# Patient Record
Sex: Female | Born: 1985 | Race: Black or African American | Hispanic: No | Marital: Single | Smoking: Former smoker
Health system: Southern US, Community
[De-identification: ages and names within clinical notes are randomized; demographics above are authoritative.]

## PROBLEM LIST (undated history)

## (undated) ENCOUNTER — Emergency Department (HOSPITAL_COMMUNITY): Discharge status: Absent without leave | Payer: Medicaid Other

## (undated) DIAGNOSIS — E559 Vitamin D deficiency, unspecified: Secondary | ICD-10-CM

## (undated) DIAGNOSIS — E119 Type 2 diabetes mellitus without complications: Secondary | ICD-10-CM

## (undated) DIAGNOSIS — I10 Essential (primary) hypertension: Secondary | ICD-10-CM

## (undated) DIAGNOSIS — F32A Depression, unspecified: Secondary | ICD-10-CM

## (undated) DIAGNOSIS — B379 Candidiasis, unspecified: Secondary | ICD-10-CM

## (undated) DIAGNOSIS — F4310 Post-traumatic stress disorder, unspecified: Secondary | ICD-10-CM

## (undated) DIAGNOSIS — N939 Abnormal uterine and vaginal bleeding, unspecified: Secondary | ICD-10-CM

## (undated) DIAGNOSIS — F329 Major depressive disorder, single episode, unspecified: Secondary | ICD-10-CM

## (undated) DIAGNOSIS — G43909 Migraine, unspecified, not intractable, without status migrainosus: Secondary | ICD-10-CM

## (undated) DIAGNOSIS — I499 Cardiac arrhythmia, unspecified: Secondary | ICD-10-CM

## (undated) DIAGNOSIS — R011 Cardiac murmur, unspecified: Secondary | ICD-10-CM

## (undated) DIAGNOSIS — B009 Herpesviral infection, unspecified: Secondary | ICD-10-CM

## (undated) DIAGNOSIS — J45909 Unspecified asthma, uncomplicated: Secondary | ICD-10-CM

## (undated) DIAGNOSIS — Z87442 Personal history of urinary calculi: Secondary | ICD-10-CM

## (undated) DIAGNOSIS — Z975 Presence of (intrauterine) contraceptive device: Secondary | ICD-10-CM

## (undated) DIAGNOSIS — R7309 Other abnormal glucose: Secondary | ICD-10-CM

## (undated) DIAGNOSIS — M1990 Unspecified osteoarthritis, unspecified site: Secondary | ICD-10-CM

## (undated) DIAGNOSIS — N898 Other specified noninflammatory disorders of vagina: Secondary | ICD-10-CM

## (undated) DIAGNOSIS — E282 Polycystic ovarian syndrome: Secondary | ICD-10-CM

## (undated) DIAGNOSIS — K8020 Calculus of gallbladder without cholecystitis without obstruction: Secondary | ICD-10-CM

## (undated) DIAGNOSIS — G629 Polyneuropathy, unspecified: Secondary | ICD-10-CM

## (undated) DIAGNOSIS — E669 Obesity, unspecified: Secondary | ICD-10-CM

## (undated) DIAGNOSIS — G4730 Sleep apnea, unspecified: Secondary | ICD-10-CM

## (undated) DIAGNOSIS — J40 Bronchitis, not specified as acute or chronic: Secondary | ICD-10-CM

## (undated) DIAGNOSIS — Z202 Contact with and (suspected) exposure to infections with a predominantly sexual mode of transmission: Secondary | ICD-10-CM

## (undated) DIAGNOSIS — F419 Anxiety disorder, unspecified: Secondary | ICD-10-CM

## (undated) DIAGNOSIS — R51 Headache: Secondary | ICD-10-CM

## (undated) HISTORY — DX: Type 2 diabetes mellitus without complications: E11.9

## (undated) HISTORY — DX: Other specified noninflammatory disorders of vagina: N89.8

## (undated) HISTORY — PX: CHOLECYSTECTOMY: SHX55

## (undated) HISTORY — DX: Cardiac arrhythmia, unspecified: I49.9

## (undated) HISTORY — DX: Candidiasis, unspecified: B37.9

## (undated) HISTORY — DX: Presence of (intrauterine) contraceptive device: Z97.5

## (undated) HISTORY — DX: Depression, unspecified: F32.A

## (undated) HISTORY — DX: Major depressive disorder, single episode, unspecified: F32.9

## (undated) HISTORY — DX: Anxiety disorder, unspecified: F41.9

## (undated) HISTORY — DX: Obesity, unspecified: E66.9

## (undated) HISTORY — PX: TONSILLECTOMY: SUR1361

## (undated) HISTORY — DX: Migraine, unspecified, not intractable, without status migrainosus: G43.909

## (undated) HISTORY — DX: Unspecified asthma, uncomplicated: J45.909

## (undated) HISTORY — DX: Herpesviral infection, unspecified: B00.9

## (undated) HISTORY — DX: Headache: R51

## (undated) HISTORY — DX: Polyneuropathy, unspecified: G62.9

## (undated) HISTORY — DX: Cardiac murmur, unspecified: R01.1

## (undated) HISTORY — DX: Contact with and (suspected) exposure to infections with a predominantly sexual mode of transmission: Z20.2

## (undated) HISTORY — DX: Unspecified osteoarthritis, unspecified site: M19.90

## (undated) HISTORY — DX: Post-traumatic stress disorder, unspecified: F43.10

## (undated) HISTORY — DX: Other abnormal glucose: R73.09

## (undated) HISTORY — DX: Vitamin D deficiency, unspecified: E55.9

---
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
## 2005-03-11 ENCOUNTER — Emergency Department (HOSPITAL_COMMUNITY): Admission: EM | Admit: 2005-03-11 | Discharge: 2005-03-11 | Payer: Self-pay | Admitting: Emergency Medicine

## 2007-04-30 ENCOUNTER — Emergency Department (HOSPITAL_COMMUNITY): Admission: EM | Admit: 2007-04-30 | Discharge: 2007-04-30 | Payer: Self-pay | Admitting: Emergency Medicine

## 2007-07-21 ENCOUNTER — Emergency Department (HOSPITAL_COMMUNITY): Admission: EM | Admit: 2007-07-21 | Discharge: 2007-07-21 | Payer: Self-pay | Admitting: Emergency Medicine

## 2007-09-04 ENCOUNTER — Emergency Department (HOSPITAL_COMMUNITY): Admission: EM | Admit: 2007-09-04 | Discharge: 2007-09-04 | Payer: Self-pay | Admitting: Emergency Medicine

## 2008-10-29 ENCOUNTER — Emergency Department (HOSPITAL_COMMUNITY): Admission: EM | Admit: 2008-10-29 | Discharge: 2008-10-29 | Payer: Self-pay | Admitting: Emergency Medicine

## 2009-07-24 ENCOUNTER — Emergency Department (HOSPITAL_COMMUNITY): Admission: EM | Admit: 2009-07-24 | Discharge: 2009-07-24 | Payer: Self-pay | Admitting: Emergency Medicine

## 2010-03-12 ENCOUNTER — Emergency Department (HOSPITAL_COMMUNITY): Admission: EM | Admit: 2010-03-12 | Discharge: 2010-03-12 | Payer: Self-pay | Admitting: Emergency Medicine

## 2010-03-23 ENCOUNTER — Other Ambulatory Visit: Admission: RE | Admit: 2010-03-23 | Discharge: 2010-03-23 | Payer: Self-pay | Admitting: Unknown Physician Specialty

## 2010-07-24 ENCOUNTER — Emergency Department (HOSPITAL_COMMUNITY): Admission: EM | Admit: 2010-07-24 | Discharge: 2010-07-24 | Payer: Self-pay | Admitting: Emergency Medicine

## 2010-08-02 ENCOUNTER — Emergency Department (HOSPITAL_COMMUNITY): Admission: EM | Admit: 2010-08-02 | Discharge: 2010-08-03 | Payer: Self-pay | Admitting: Emergency Medicine

## 2010-10-15 ENCOUNTER — Emergency Department (HOSPITAL_COMMUNITY)
Admission: EM | Admit: 2010-10-15 | Discharge: 2010-10-15 | Payer: Self-pay | Source: Home / Self Care | Admitting: Emergency Medicine

## 2011-01-18 LAB — RAPID STREP SCREEN (MED CTR MEBANE ONLY): Streptococcus, Group A Screen (Direct): NEGATIVE

## 2011-01-20 LAB — POCT I-STAT, CHEM 8
Calcium, Ion: 0.96 mmol/L — ABNORMAL LOW (ref 1.12–1.32)
Chloride: 109 mEq/L (ref 96–112)
Potassium: 3.4 mEq/L — ABNORMAL LOW (ref 3.5–5.1)
Sodium: 139 mEq/L (ref 135–145)
TCO2: 21 mmol/L (ref 0–100)

## 2011-01-20 LAB — BASIC METABOLIC PANEL
BUN: 6 mg/dL (ref 6–23)
CO2: 24 mEq/L (ref 19–32)
Chloride: 105 mEq/L (ref 96–112)
GFR calc Af Amer: >60 mL/min (ref >60)
GFR calc non Af Amer: >60 mL/min (ref >60)
Potassium: 3.5 mEq/L (ref 3.5–5.1)
Sodium: 136 mEq/L (ref 135–145)

## 2011-01-20 LAB — URINALYSIS, ROUTINE W REFLEX MICROSCOPIC
Glucose, UA: NEGATIVE mg/dL
Ketones, ur: NEGATIVE mg/dL
Nitrite: NEGATIVE
pH: 6.5 (ref 5.0–8.0)

## 2011-01-20 LAB — GC/CHLAMYDIA PROBE AMP, GENITAL: GC Probe Amp, Genital: NEGATIVE

## 2011-02-11 LAB — PREGNANCY, URINE: Preg Test, Ur: NEGATIVE

## 2011-06-15 ENCOUNTER — Emergency Department (HOSPITAL_COMMUNITY)
Admission: EM | Admit: 2011-06-15 | Discharge: 2011-06-15 | Disposition: A | Discharge status: Home / Self Care | Payer: Self-pay | Attending: Emergency Medicine | Admitting: Emergency Medicine

## 2011-06-15 ENCOUNTER — Encounter: Payer: Self-pay | Admitting: *Deleted

## 2011-06-15 DIAGNOSIS — B9689 Other specified bacterial agents as the cause of diseases classified elsewhere: Secondary | ICD-10-CM | POA: Insufficient documentation

## 2011-06-15 DIAGNOSIS — R197 Diarrhea, unspecified: Secondary | ICD-10-CM | POA: Insufficient documentation

## 2011-06-15 DIAGNOSIS — A499 Bacterial infection, unspecified: Secondary | ICD-10-CM | POA: Insufficient documentation

## 2011-06-15 DIAGNOSIS — R109 Unspecified abdominal pain: Secondary | ICD-10-CM | POA: Insufficient documentation

## 2011-06-15 DIAGNOSIS — I10 Essential (primary) hypertension: Secondary | ICD-10-CM | POA: Insufficient documentation

## 2011-06-15 DIAGNOSIS — E282 Polycystic ovarian syndrome: Secondary | ICD-10-CM | POA: Insufficient documentation

## 2011-06-15 DIAGNOSIS — N760 Acute vaginitis: Secondary | ICD-10-CM | POA: Insufficient documentation

## 2011-06-15 DIAGNOSIS — R110 Nausea: Secondary | ICD-10-CM | POA: Insufficient documentation

## 2011-06-15 HISTORY — DX: Polycystic ovarian syndrome: E28.2

## 2011-06-15 HISTORY — DX: Abnormal uterine and vaginal bleeding, unspecified: N93.9

## 2011-06-15 HISTORY — DX: Sleep apnea, unspecified: G47.30

## 2011-06-15 HISTORY — DX: Essential (primary) hypertension: I10

## 2011-06-15 HISTORY — DX: Bronchitis, not specified as acute or chronic: J40

## 2011-06-15 LAB — URINE MICROSCOPIC-ADD ON

## 2011-06-15 LAB — URINALYSIS, ROUTINE W REFLEX MICROSCOPIC
Glucose, UA: NEGATIVE mg/dL
Ketones, ur: NEGATIVE mg/dL
Leukocytes, UA: NEGATIVE
Specific Gravity, Urine: 1.015 (ref 1.005–1.030)
pH: 6.5 (ref 5.0–8.0)

## 2011-06-15 LAB — PREGNANCY, URINE: Preg Test, Ur: NEGATIVE

## 2011-06-15 MED ORDER — ONDANSETRON 8 MG PO TBDP
8.0000 mg | ORAL_TABLET | Freq: Once | ORAL | Status: AC
  Administered 2011-06-15: 8 mg via ORAL
  Filled 2011-06-15: qty 1

## 2011-06-15 MED ORDER — OXYCODONE-ACETAMINOPHEN 5-325 MG PO TABS
2.0000 | ORAL_TABLET | Freq: Once | ORAL | Status: AC
  Administered 2011-06-15: 2 via ORAL
  Filled 2011-06-15: qty 2

## 2011-06-15 MED ORDER — ONDANSETRON 8 MG PO TBDP: 8.0000 mg | ORAL_TABLET | Freq: Three times a day (TID) | ORAL | Status: AC | PRN

## 2011-06-15 MED ORDER — METRONIDAZOLE 500 MG PO TABS: 500.0000 mg | ORAL_TABLET | Freq: Two times a day (BID) | ORAL | Status: AC

## 2011-06-15 NOTE — ED Provider Notes (Signed)
History   Chart scribed for Joya Gaskins, MD by Enos Fling; the patient was seen in room [facility]09/[facility]09; this patient's care was started at 12:08 PM.    CSN: [id number] Arrival date & time: 06/15/2011 11:44 AM  Chief Complaint  Patient presents with  . Abdominal Pain   HPI [name] is a 25 y.o. female who presents to the Emergency Department complaining of diarrhea. Pt reports persistent,worsening diarrhea x 3 days, 3 episodes of loose stool this AM, no blood in stool, with associated mild low sharp abd pain and nausea, no vomiting. Mild dysuria and vaginal discharge but no hematuria or vaginal bleeding. Pt tolerated food and fluids po. Denies fever, chills, or ha. No sick contacts or recent abx or hospitalization. No major abd surgeries. Pt h/o polycystic ovarian syndrome but states this pain different than usual pain.  PAST MEDICAL HISTORY:  Past Medical History  Diagnosis Date  . Hypertension   . Polycystic ovarian syndrome   . Bronchitis   . Abnormal vaginal bleeding   . Sleep apnea      PAST SURGICAL HISTORY:  Past Surgical History  Procedure Date  . Tonsillectomy      MEDICATIONS:  Previous Medications   IBUPROFEN (ADVIL,MOTRIN) 200 MG TABLET    Take 400 mg by mouth every 8 (eight) hours as needed. Headaches/pain    LISINOPRIL-HYDROCHLOROTHIAZIDE (PRINZIDE,ZESTORETIC) 20-25 MG PER TABLET    Take 1 tablet by mouth daily.       ALLERGIES:  Allergies as of 06/15/2011  . (No Known Allergies)     FAMILY HISTORY:  No family history on file.   SOCIAL HISTORY: History   Social History  . Marital Status: Single    Spouse Name: N/A    Number of Children: N/A  . Years of Education: N/A   Social History Main Topics  . Smoking status: Never Smoker   . Smokeless tobacco: None  . Alcohol Use: Yes     occasional  . Drug Use: No  . Sexually Active: Yes    Birth Control/ Protection: None   Other Topics Concern  . None   Social History Narrative  .  None     Review of Systems 10 Systems reviewed and are negative for acute change except as noted in the HPI.  Physical Exam  BP 137/106  Pulse 84  Temp(Src) 98.3 F (36.8 C) (Oral)  Resp 18  Ht 5\' 7"  (1.702 m)  Wt 261 lb (118.389 kg)  BMI 40.88 kg/m2  SpO2 98%  LMP 06/05/2011  Physical Exam CONSTITUTIONAL: Well developed/well nourished HEAD AND FACE: Normocephalic/atraumatic EYES: EOMI/PERRL ENMT: Mucous membranes moist NECK: supple no meningeal signs SPINE:entire spine nontender CV: S1/S2 noted, no murmurs/rubs/gallops noted LUNGS: Lungs are clear to auscultation bilaterally, no apparent distress ABDOMEN: soft, no rebound or guarding, mild diffuse lower abd tenderness GU:no cva tenderness, pelvic exam with nurse present, small amt of vag discharge, no vag bleeding, no cmt, no adnexal tenderness NEURO: Pt is awake/alert, moves all extremitiesx4 EXTREMITIES: pulses normal, full ROM SKIN: warm, color normal PSYCH: no abnormalities of mood noted  ED Course  Procedures  OTHER DATA REVIEWED: Nursing notes, vital signs, and past medical records reviewed.   LABS / RADIOLOGY:  All labs/vitals reviewed and considered   MDM: Pt well appearing, taking PO, abd soft     IMPRESSION Acute diarrhea  Bacterial vaginosis      PLAN:  Discharge home The patient is to return the emergency department if there is  any worsening of symptoms. I have reviewed the discharge instructions with the patient   CONDITION ON DISCHARGE: Stable   MEDICATIONS GIVEN IN THE E.D. ondansetron (ZOFRAN-ODT) disintegrating tablet 8 mg (8 mg Oral Given 06/15/11 1215)  oxyCODONE-acetaminophen (PERCOCET) 5-325 MG per tablet 2 tablet (2 tablet Oral Given 06/15/11 1306)     DISCHARGE MEDICATIONS: none  I personally performed the services described in this documentation, which was scribed in my presence. The recorded information has been reviewed and considered. Joya Gaskins,  MD         Joya Gaskins, MD 06/15/11 [phone]

## 2011-06-15 NOTE — ED Notes (Signed)
C/o lower abd pain with nausea and diarrhea x 4 days.  Denies vomiting.  Also experiencing decreased appetite and urinary retention.

## 2011-06-16 LAB — GC/CHLAMYDIA PROBE AMP, GENITAL: GC Probe Amp, Genital: NEGATIVE

## 2011-08-12 LAB — URINALYSIS, ROUTINE W REFLEX MICROSCOPIC
Ketones, ur: NEGATIVE mg/dL
Leukocytes, UA: NEGATIVE
Nitrite: NEGATIVE
Specific Gravity, Urine: 1.025 (ref 1.005–1.030)
pH: 6 (ref 5.0–8.0)

## 2011-08-12 LAB — URINE MICROSCOPIC-ADD ON

## 2011-08-12 LAB — PREGNANCY, URINE: Preg Test, Ur: NEGATIVE

## 2011-08-17 LAB — STREP A DNA PROBE

## 2011-08-17 LAB — RAPID STREP SCREEN (MED CTR MEBANE ONLY): Streptococcus, Group A Screen (Direct): NEGATIVE

## 2011-08-24 LAB — DIFFERENTIAL
Eosinophils Absolute: 0.1
Eosinophils Relative: 1
Lymphs Abs: 3.4 — ABNORMAL HIGH
Monocytes Relative: 7

## 2011-08-24 LAB — BASIC METABOLIC PANEL
BUN: 6
CO2: 27
Chloride: 106
Potassium: 3.3 — ABNORMAL LOW

## 2011-08-24 LAB — URINALYSIS, ROUTINE W REFLEX MICROSCOPIC
Bilirubin Urine: NEGATIVE
Glucose, UA: NEGATIVE
Ketones, ur: NEGATIVE
pH: 7

## 2011-08-24 LAB — URINE MICROSCOPIC-ADD ON

## 2011-08-24 LAB — CBC
HCT: 36.1
MCV: 84.3
Platelets: 326
RBC: 4.28
WBC: 9.7

## 2011-09-23 ENCOUNTER — Emergency Department (HOSPITAL_COMMUNITY)
Admission: EM | Admit: 2011-09-23 | Discharge: 2011-09-23 | Disposition: A | Discharge status: Home / Self Care | Payer: Self-pay | Attending: Emergency Medicine | Admitting: Emergency Medicine

## 2011-09-23 ENCOUNTER — Encounter (HOSPITAL_COMMUNITY): Payer: Self-pay | Admitting: Emergency Medicine

## 2011-09-23 ENCOUNTER — Emergency Department (HOSPITAL_COMMUNITY): Payer: Self-pay

## 2011-09-23 DIAGNOSIS — R319 Hematuria, unspecified: Secondary | ICD-10-CM | POA: Insufficient documentation

## 2011-09-23 DIAGNOSIS — I10 Essential (primary) hypertension: Secondary | ICD-10-CM | POA: Insufficient documentation

## 2011-09-23 DIAGNOSIS — N201 Calculus of ureter: Secondary | ICD-10-CM | POA: Insufficient documentation

## 2011-09-23 DIAGNOSIS — N200 Calculus of kidney: Secondary | ICD-10-CM | POA: Insufficient documentation

## 2011-09-23 DIAGNOSIS — D72829 Elevated white blood cell count, unspecified: Secondary | ICD-10-CM | POA: Insufficient documentation

## 2011-09-23 LAB — URINE MICROSCOPIC-ADD ON

## 2011-09-23 LAB — CBC
MCH: 30 pg (ref 26.0–34.0)
Platelets: 282 10*3/uL (ref 150–400)
RBC: 4.16 MIL/uL (ref 3.87–5.11)
WBC: 14.8 10*3/uL — ABNORMAL HIGH (ref 4.0–10.5)

## 2011-09-23 LAB — COMPREHENSIVE METABOLIC PANEL
ALT: 11 U/L (ref 0–35)
AST: 17 U/L (ref 0–37)
Albumin: 4.1 g/dL (ref 3.5–5.2)
Alkaline Phosphatase: 100 U/L (ref 39–117)
GFR calc Af Amer: >90 mL/min (ref >90)
Glucose, Bld: 166 mg/dL — ABNORMAL HIGH (ref 70–99)
Potassium: 3.7 mEq/L (ref 3.5–5.1)
Sodium: 138 mEq/L (ref 135–145)
Total Protein: 7.8 g/dL (ref 6.0–8.3)

## 2011-09-23 LAB — URINALYSIS, ROUTINE W REFLEX MICROSCOPIC
Leukocytes, UA: NEGATIVE
Nitrite: NEGATIVE
Specific Gravity, Urine: 1.025 (ref 1.005–1.030)
pH: 6.5 (ref 5.0–8.0)

## 2011-09-23 LAB — DIFFERENTIAL
Eosinophils Absolute: 0.1 10*3/uL (ref 0.0–0.7)
Lymphs Abs: 4.6 10*3/uL — ABNORMAL HIGH (ref 0.7–4.0)
Neutrophils Relative %: 61 % (ref 43–77)

## 2011-09-23 MED ORDER — IBUPROFEN 800 MG PO TABS: 800.0000 mg | ORAL_TABLET | Freq: Three times a day (TID) | ORAL | Status: AC

## 2011-09-23 MED ORDER — HYDROCODONE-ACETAMINOPHEN 5-325 MG PO TABS
2.0000 | ORAL_TABLET | Freq: Once | ORAL | Status: AC
  Administered 2011-09-23: 2 via ORAL
  Filled 2011-09-23: qty 2

## 2011-09-23 MED ORDER — ONDANSETRON HCL 4 MG/2ML IJ SOLN
4.0000 mg | Freq: Once | INTRAMUSCULAR | Status: AC
  Administered 2011-09-23: 4 mg via INTRAVENOUS
  Filled 2011-09-23: qty 2

## 2011-09-23 MED ORDER — PROMETHAZINE HCL 25 MG PO TABS: 25.0000 mg | ORAL_TABLET | Freq: Four times a day (QID) | ORAL | Status: DC | PRN

## 2011-09-23 MED ORDER — TAMSULOSIN HCL 0.4 MG PO CAPS: 0.4000 mg | ORAL_CAPSULE | Freq: Two times a day (BID) | ORAL | Status: DC

## 2011-09-23 MED ORDER — HYDROCODONE-ACETAMINOPHEN 5-500 MG PO TABS: 1.0000 | ORAL_TABLET | Freq: Four times a day (QID) | ORAL | Status: AC | PRN

## 2011-09-23 MED ORDER — MORPHINE SULFATE 4 MG/ML IJ SOLN
4.0000 mg | Freq: Once | INTRAMUSCULAR | Status: AC
  Administered 2011-09-23: 4 mg via INTRAVENOUS
  Filled 2011-09-23: qty 1

## 2011-09-23 MED ORDER — KETOROLAC TROMETHAMINE 30 MG/ML IJ SOLN
30.0000 mg | Freq: Once | INTRAMUSCULAR | Status: AC
  Administered 2011-09-23: 30 mg via INTRAVENOUS
  Filled 2011-09-23: qty 1

## 2011-09-23 NOTE — ED Provider Notes (Signed)
History     CSN: [id number] Arrival date & time: 09/23/2011  1:53 AM   First MD Initiated Contact with Patient 09/23/11 0207      Chief Complaint  Patient presents with  . Abdominal Pain  . Emesis    (Consider location/radiation/quality/duration/timing/severity/associated sxs/prior treatment) HPI Comments: 25 year old female with no history of abdominal surgery presents with right upper quadrant and right side pain. This was acute in onset approximately 1.5 hours prior to arrival, constant, sharp, nonradiating. She has had several episodes of vomiting since being in the emergency department. He states this was acute in spontaneous in onset not related to trauma or other body wall injury she denies dysuria, diarrhea, hematuria, fevers or chills  Patient is a 25 y.o. female presenting with abdominal pain and vomiting.  Abdominal Pain The primary symptoms of the illness include abdominal pain, nausea and vomiting. The primary symptoms of the illness do not include fever, fatigue, shortness of breath, diarrhea, hematochezia, dysuria, vaginal discharge or vaginal bleeding. Episode onset: 1.5 hours pta. The onset of the illness was sudden. The problem has not changed since onset. The pain came on suddenly. The abdominal pain has been unchanged since its onset. The abdominal pain is located in the RUQ. The severity of the abdominal pain is 10/10. Exacerbated by: nothing.  Pregnant Now: unknown. The patient has not had a change in bowel habit. Symptoms associated with the illness do not include chills, anorexia, diaphoresis, heartburn, constipation, urgency, hematuria, frequency or back pain. Significant associated medical issues do not include PUD, GERD, inflammatory bowel disease, diabetes, gallstones, liver disease, substance abuse or diverticulitis.  Emesis  Associated symptoms include abdominal pain. Pertinent negatives include no chills, no diarrhea and no fever.    Past Medical History    Diagnosis Date  . Hypertension   . Polycystic ovarian syndrome   . Bronchitis   . Abnormal vaginal bleeding   . Sleep apnea     Past Surgical History  Procedure Date  . Tonsillectomy     No family history on file.  History  Substance Use Topics  . Smoking status: Never Smoker   . Smokeless tobacco: Not on file  . Alcohol Use: Yes     occasional    OB History    Grav Para Term Preterm Abortions TAB SAB Ect Mult Living                  Review of Systems  Constitutional: Negative for fever, chills, diaphoresis and fatigue.  Respiratory: Negative for shortness of breath.   Gastrointestinal: Positive for nausea, vomiting and abdominal pain. Negative for heartburn, diarrhea, constipation, hematochezia and anorexia.  Genitourinary: Negative for dysuria, urgency, frequency, hematuria, vaginal bleeding and vaginal discharge.  Musculoskeletal: Negative for back pain.  All other systems reviewed and are negative.    Allergies  Review of patient's allergies indicates no known allergies.  Home Medications   Current Outpatient Rx  Name Route Sig Dispense Refill  . HYDROCODONE-ACETAMINOPHEN 5-500 MG PO TABS Oral Take 1-2 tablets by mouth every 6 (six) hours as needed for pain. 15 tablet 0  . IBUPROFEN 200 MG PO TABS Oral Take 400 mg by mouth every 8 (eight) hours as needed. Headaches/pain     . IBUPROFEN 800 MG PO TABS Oral Take 1 tablet (800 mg total) by mouth 3 (three) times daily. 21 tablet 0  . LISINOPRIL-HYDROCHLOROTHIAZIDE 20-25 MG PO TABS Oral Take 1 tablet by mouth daily.      Marland Kitchen PROMETHAZINE HCL  25 MG PO TABS Oral Take 1 tablet (25 mg total) by mouth every 6 (six) hours as needed for nausea. 12 tablet 0  . TAMSULOSIN HCL 0.4 MG PO CAPS Oral Take 1 capsule (0.4 mg total) by mouth 2 (two) times daily. 10 capsule 0    BP 112/66  Pulse 68  Temp(Src) 97.9 F (36.6 C) (Oral)  Resp 18  Ht 5\' 6"  (1.676 m)  Wt 260 lb (117.935 kg)  BMI 41.97 kg/m2  SpO2 99%  LMP  09/05/2011  Physical Exam  Nursing note and vitals reviewed. Constitutional: She appears well-developed and well-nourished. She appears distressed ( uncomfortable appearing).  HENT:  Head: Normocephalic and atraumatic.  Mouth/Throat: Oropharynx is clear and moist. No oropharyngeal exudate.  Eyes: Conjunctivae and EOM are normal. Pupils are equal, round, and reactive to light. Right eye exhibits no discharge. Left eye exhibits no discharge. No scleral icterus.  Neck: Normal range of motion. Neck supple. No JVD present. No thyromegaly present.  Cardiovascular: Normal rate, regular rhythm, normal heart sounds and intact distal pulses.  Exam reveals no gallop and no friction rub.   No murmur heard. Pulmonary/Chest: Effort normal and breath sounds normal. No respiratory distress. She has no wheezes. She has no rales.  Abdominal: Soft. Bowel sounds are normal. She exhibits no distension and no mass. There is tenderness ( ttp in the RUQ, R side and inferiorlyl to the R iliac crest).       Obese, no LUQ, LLQ, SP or epigastric ttp  Musculoskeletal: Normal range of motion. She exhibits no edema and no tenderness.  Lymphadenopathy:    She has no cervical adenopathy.  Neurological: She is alert. Coordination normal.  Skin: Skin is warm and dry. No rash noted. No erythema.  Psychiatric: She has a normal mood and affect. Her behavior is normal.    ED Course  Procedures (including critical care time)  Labs Reviewed  URINALYSIS, ROUTINE W REFLEX MICROSCOPIC - Abnormal; Notable for the following:    Color, Urine AMBER (*) BIOCHEMICALS MAY BE AFFECTED BY COLOR   Appearance CLOUDY (*)    Hgb urine dipstick LARGE (*)    Ketones, ur 15 (*)    All other components within normal limits  CBC - Abnormal; Notable for the following:    WBC 14.8 (*)    All other components within normal limits  DIFFERENTIAL - Abnormal; Notable for the following:    Neutro Abs 9.0 (*)    Lymphs Abs 4.6 (*)    All other  components within normal limits  COMPREHENSIVE METABOLIC PANEL - Abnormal; Notable for the following:    Glucose, Bld 166 (*)    Total Bilirubin 0.1 (*)    All other components within normal limits  URINE MICROSCOPIC-ADD ON - Abnormal; Notable for the following:    Squamous Epithelial / LPF MANY (*)    Bacteria, UA FEW (*)    All other components within normal limits  PREGNANCY, URINE  LIPASE, BLOOD   Ct Abdomen Pelvis Wo Contrast  09/23/2011  *RADIOLOGY REPORT*  Clinical Data: Severe right flank pain, nausea and vomiting.  CT ABDOMEN AND PELVIS WITHOUT CONTRAST  Technique:  Multidetector CT imaging of the abdomen and pelvis was performed following the standard protocol without intravenous contrast.  Comparison: None.  Findings: The visualized lung bases are clear.  The liver and spleen are unremarkable in appearance.  The gallbladder is within normal limits.  The pancreas and adrenal glands are unremarkable.  There is question of  minimal right-sided hydronephrosis, though this is not well characterized.  The right ureter is not particularly prominent.  There is a possible obstructing 4 x 3 mm stone at the level of the distal right ureter, just proximal to the right vesicoureteral junction.  The left kidney is unremarkable in appearance.  On comparison with the right, there is more fullness of the right-sided collecting system.  No significant perinephric stranding is noted.  No nonobstructing renal stones are seen.  No free fluid is identified.  The small bowel is unremarkable in appearance.  The stomach is within normal limits.  No acute vascular abnormalities are seen.  The appendix remains normal in caliber, without evidence for appendicitis.  The colon is unremarkable in appearance.  The bladder is mildly distended and appears grossly unremarkable. The uterus is within normal limits.  The ovaries are relatively symmetric; no suspicious adnexal masses are seen.  No inguinal lymphadenopathy is seen.   No acute osseous abnormalities are identified.  IMPRESSION: Question of minimal right-sided hydronephrosis, with a possible obstructing 4 x 3 mm stone at the level of the distal right ureter, just proximal to the right vesicoureteral junction.  Original Report Authenticated By: Tonia Ghent, M.D.     1. Ureterolithiasis   2. Kidney stone on right side       MDM  Patient appears in discomfort, and also appears to have a colicky type sharp right-sided abdominal pain. Would consider kidney stone the patient does have significant tenderness to palpation. Vital signs appear normal, will check comprehensive metabolic panel, lipase, urinalysis. Pain medications ordered  CT scan reviewed with patient, urinalysis showing hematuria, labs showing leukocytosis, CT scan showing distal right ureteral stone. Pain has been well controlled since initial dose of medications, vital signs normal, patient agreeable with discharge and followup.        Vida Roller, MD 09/23/11 [phone]

## 2011-09-23 NOTE — ED Notes (Signed)
Patient taken to room 19 via wheelchair.  Upon arrival to room, patient jumped up from wheelchair and ran to bathroom.

## 2011-09-23 NOTE — ED Notes (Signed)
Patient c/o left side pain that started about 30 min PTA.  Patient states has had vomiting x 3 in past 30 minutes.

## 2011-10-24 ENCOUNTER — Emergency Department (HOSPITAL_COMMUNITY): Payer: Self-pay

## 2011-10-24 ENCOUNTER — Encounter (HOSPITAL_COMMUNITY): Payer: Self-pay

## 2011-10-24 ENCOUNTER — Inpatient Hospital Stay (HOSPITAL_COMMUNITY)
Admission: EM | Admit: 2011-10-24 | Discharge: 2011-10-30 | DRG: 440 | Disposition: A | Discharge status: Home / Self Care | Payer: Self-pay | Attending: Family Medicine | Admitting: Family Medicine

## 2011-10-24 DIAGNOSIS — E669 Obesity, unspecified: Secondary | ICD-10-CM | POA: Diagnosis present

## 2011-10-24 DIAGNOSIS — K8020 Calculus of gallbladder without cholecystitis without obstruction: Secondary | ICD-10-CM | POA: Diagnosis present

## 2011-10-24 DIAGNOSIS — I10 Essential (primary) hypertension: Secondary | ICD-10-CM | POA: Diagnosis present

## 2011-10-24 DIAGNOSIS — K859 Acute pancreatitis without necrosis or infection, unspecified: Principal | ICD-10-CM | POA: Diagnosis present

## 2011-10-24 DIAGNOSIS — G4730 Sleep apnea, unspecified: Secondary | ICD-10-CM | POA: Diagnosis present

## 2011-10-24 LAB — CBC
MCH: 29.8 pg (ref 26.0–34.0)
Platelets: 217 10*3/uL (ref 150–400)
RBC: 3.92 MIL/uL (ref 3.87–5.11)
WBC: 8.5 10*3/uL (ref 4.0–10.5)

## 2011-10-24 LAB — COMPREHENSIVE METABOLIC PANEL
ALT: 10 U/L (ref 0–35)
AST: 16 U/L (ref 0–37)
Alkaline Phosphatase: 90 U/L (ref 39–117)
GFR calc Af Amer: >90 mL/min (ref >90)
Glucose, Bld: 88 mg/dL (ref 70–99)
Potassium: 3.5 mEq/L (ref 3.5–5.1)
Sodium: 139 mEq/L (ref 135–145)
Total Protein: 6.8 g/dL (ref 6.0–8.3)

## 2011-10-24 LAB — URINALYSIS, ROUTINE W REFLEX MICROSCOPIC
Glucose, UA: NEGATIVE mg/dL
Ketones, ur: NEGATIVE mg/dL
Protein, ur: NEGATIVE mg/dL

## 2011-10-24 LAB — DIFFERENTIAL
Basophils Absolute: 0 10*3/uL (ref 0.0–0.1)
Eosinophils Absolute: 0 10*3/uL (ref 0.0–0.7)
Lymphocytes Relative: 33 % (ref 12–46)
Lymphs Abs: 2.8 10*3/uL (ref 0.7–4.0)
Neutrophils Relative %: 51 % (ref 43–77)

## 2011-10-24 LAB — URINE MICROSCOPIC-ADD ON

## 2011-10-24 MED ORDER — SODIUM CHLORIDE 0.9 % IV SOLN
INTRAVENOUS | Status: DC
  Administered 2011-10-24 – 2011-10-29 (×6): via INTRAVENOUS

## 2011-10-24 MED ORDER — KETOROLAC TROMETHAMINE 30 MG/ML IJ SOLN
30.0000 mg | Freq: Once | INTRAMUSCULAR | Status: AC
  Administered 2011-10-24: 30 mg via INTRAVENOUS
  Filled 2011-10-24: qty 1

## 2011-10-24 MED ORDER — MORPHINE SULFATE 4 MG/ML IJ SOLN
4.0000 mg | Freq: Once | INTRAMUSCULAR | Status: AC
  Administered 2011-10-24: 4 mg via INTRAVENOUS
  Filled 2011-10-24: qty 1

## 2011-10-24 MED ORDER — HYDROMORPHONE HCL PF 1 MG/ML IJ SOLN
1.0000 mg | Freq: Once | INTRAMUSCULAR | Status: AC
  Administered 2011-10-24: 1 mg via INTRAVENOUS
  Filled 2011-10-24: qty 1

## 2011-10-24 MED ORDER — SODIUM CHLORIDE 0.9 % IV BOLUS (SEPSIS)
1000.0000 mL | Freq: Once | INTRAVENOUS | Status: AC
  Administered 2011-10-24: 1000 mL via INTRAVENOUS

## 2011-10-24 MED ORDER — ACETAMINOPHEN 500 MG PO TABS
500.0000 mg | ORAL_TABLET | ORAL | Status: DC | PRN
  Administered 2011-10-28: 500 mg via ORAL
  Filled 2011-10-24: qty 1

## 2011-10-24 MED ORDER — ONDANSETRON HCL 4 MG/2ML IJ SOLN
4.0000 mg | Freq: Four times a day (QID) | INTRAMUSCULAR | Status: DC | PRN
  Administered 2011-10-25 – 2011-10-30 (×17): 4 mg via INTRAVENOUS
  Filled 2011-10-24 (×17): qty 2

## 2011-10-24 MED ORDER — ONDANSETRON HCL 4 MG/2ML IJ SOLN
4.0000 mg | Freq: Once | INTRAMUSCULAR | Status: AC
  Administered 2011-10-24: 4 mg via INTRAVENOUS
  Filled 2011-10-24: qty 2

## 2011-10-24 MED ORDER — LEVOFLOXACIN IN D5W 500 MG/100ML IV SOLN
500.0000 mg | INTRAVENOUS | Status: DC
  Administered 2011-10-24 – 2011-10-29 (×6): 500 mg via INTRAVENOUS
  Filled 2011-10-24 (×7): qty 100

## 2011-10-24 MED ORDER — IOHEXOL 300 MG/ML  SOLN
100.0000 mL | Freq: Once | INTRAMUSCULAR | Status: AC | PRN
  Administered 2011-10-24: 100 mL via INTRAVENOUS

## 2011-10-24 MED ORDER — MORPHINE SULFATE 4 MG/ML IJ SOLN
4.0000 mg | INTRAMUSCULAR | Status: DC | PRN
  Administered 2011-10-25 – 2011-10-30 (×26): 4 mg via INTRAVENOUS
  Filled 2011-10-24 (×27): qty 1

## 2011-10-24 MED ORDER — ONDANSETRON 8 MG/NS 50 ML IVPB
8.0000 mg | INTRAVENOUS | Status: DC | PRN
  Filled 2011-10-24: qty 8

## 2011-10-24 MED ORDER — HYDROMORPHONE HCL PF 1 MG/ML IJ SOLN: 1.0000 mg | Freq: Once | INTRAMUSCULAR | Status: DC

## 2011-10-24 MED ORDER — LEVOFLOXACIN IN D5W 500 MG/100ML IV SOLN
INTRAVENOUS | Status: AC
  Filled 2011-10-24: qty 100

## 2011-10-24 MED ORDER — INFLUENZA VIRUS VACC SPLIT PF IM SUSP
0.5000 mL | INTRAMUSCULAR | Status: AC
  Administered 2011-10-25: 0.5 mL via INTRAMUSCULAR
  Filled 2011-10-24: qty 0.5

## 2011-10-24 NOTE — ED Provider Notes (Signed)
History     CSN: [id number] Arrival date & time: 10/24/2011  4:22 PM   First MD Initiated Contact with Patient 10/24/11 1628      No chief complaint on file.   (Consider location/radiation/quality/duration/timing/severity/associated sxs/prior treatment) HPI Comments: History of kidney stones. Developed nausea and vomiting associated with her right flank pain today. States this is identical to prior renal colic.  Patient is a 25 y.o. female presenting with flank pain. The history is provided by the patient. No language interpreter was used.  Flank Pain This is a recurrent problem. The current episode started 2 days ago. The problem occurs constantly. The problem has been gradually worsening. Associated symptoms include abdominal pain. Pertinent negatives include no chest pain, no headaches and no shortness of breath. The symptoms are aggravated by nothing. The symptoms are relieved by nothing. Treatments tried: ibuprofen. The treatment provided mild relief.    Past Medical History  Diagnosis Date  . Hypertension   . Polycystic ovarian syndrome   . Bronchitis   . Abnormal vaginal bleeding   . Sleep apnea   . Renal disorder     kidney stone    Past Surgical History  Procedure Date  . Tonsillectomy     No family history on file.  History  Substance Use Topics  . Smoking status: Never Smoker   . Smokeless tobacco: Not on file  . Alcohol Use: Yes     occasional    OB History    Grav Para Term Preterm Abortions TAB SAB Ect Mult Living                  Review of Systems  Constitutional: Negative for fever, activity change, appetite change and fatigue.  HENT: Negative for congestion, sore throat, rhinorrhea, neck pain and neck stiffness.   Respiratory: Negative for cough and shortness of breath.   Cardiovascular: Negative for chest pain and palpitations.  Gastrointestinal: Positive for nausea, vomiting, abdominal pain and diarrhea. Negative for blood in stool.    Genitourinary: Positive for flank pain. Negative for dysuria, urgency, frequency, hematuria, vaginal bleeding and vaginal discharge.  Musculoskeletal: Positive for back pain.  Neurological: Negative for dizziness, weakness, light-headedness, numbness and headaches.  All other systems reviewed and are negative.    Allergies  Review of patient's allergies indicates no known allergies.  Home Medications   Current Outpatient Rx  Name Route Sig Dispense Refill  . IBUPROFEN 200 MG PO TABS Oral Take 400 mg by mouth every 8 (eight) hours as needed. Headaches/pain     . LISINOPRIL-HYDROCHLOROTHIAZIDE 20-25 MG PO TABS Oral Take 1 tablet by mouth daily.        BP 122/77  Pulse 76  Temp(Src) 98.1 F (36.7 C) (Oral)  Resp 24  Ht 5\' 7"  (1.702 m)  Wt 267 lb (121.11 kg)  BMI 41.82 kg/m2  SpO2 100%  LMP 10/08/2011  Physical Exam  Nursing note and vitals reviewed. Constitutional: She is oriented to person, place, and time. She appears well-developed and well-nourished. She appears distressed (secondary to pain).  HENT:  Head: Normocephalic and atraumatic.  Mouth/Throat: Oropharynx is clear and moist.  Eyes: Conjunctivae and EOM are normal. Pupils are equal, round, and reactive to light.  Neck: Normal range of motion. Neck supple.  Cardiovascular: Normal rate, regular rhythm, normal heart sounds and intact distal pulses.  Exam reveals no gallop and no friction rub.   No murmur heard. Pulmonary/Chest: Effort normal and breath sounds normal. No respiratory distress.  Abdominal: Soft.  Bowel sounds are normal. There is no tenderness. There is no rebound and no guarding.       R CVA tenderness  Musculoskeletal: Normal range of motion. She exhibits no tenderness.  Neurological: She is alert and oriented to person, place, and time. No cranial nerve deficit.  Skin: Skin is warm and dry. No rash noted.    ED Course  Procedures (including critical care time)  Labs Reviewed  URINALYSIS,  ROUTINE W REFLEX MICROSCOPIC - Abnormal; Notable for the following:    APPearance HAZY (*)    Hgb urine dipstick TRACE (*)    All other components within normal limits  URINE MICROSCOPIC-ADD ON - Abnormal; Notable for the following:    Squamous Epithelial / LPF MANY (*)    Bacteria, UA FEW (*)    All other components within normal limits  CBC - Abnormal; Notable for the following:    Hemoglobin 11.7 (*)    HCT 34.4 (*)    All other components within normal limits  DIFFERENTIAL - Abnormal; Notable for the following:    Monocytes Relative 17 (*)    Monocytes Absolute 1.4 (*)    All other components within normal limits  LIPASE, BLOOD - Abnormal; Notable for the following:    Lipase 165 (*)    All other components within normal limits  POCT PREGNANCY, URINE  COMPREHENSIVE METABOLIC PANEL  POCT PREGNANCY, URINE   US Renal  10/24/2011  *RADIOLOGY REPORT*  Clinical Data: Possible right ureteral stone.  Evaluate for hydronephrosis.  RENAL/URINARY TRACT ULTRASOUND COMPLETE  Comparison:  CT 09/23/2011.  Findings:  Right Kidney:  10.8 cm. Normal size and echotexture.  No focal abnormality.  No hydronephrosis.  Left Kidney:  11.4 cm. Normal size and echotexture.  No focal abnormality.  No hydronephrosis.  Bladder:  Decompressed, grossly unremarkable.  Incidentally noted is a 3 cm gallstone within the gallbladder.  IMPRESSION: No acute findings within the kidneys.  No hydronephrosis.  Cholelithiasis.  Original Report Authenticated By: Cyndie Chime, M.D.     1. Pancreatitis   2. Cholelithiasis       MDM  Mild pancreatitis and cholelithiasis seen on renal ultrasound. She has no evidence of hydro-. This was soft she has a history of kidney stones and states that this felt identical. Minor for laboratory studies are relatively unremarkable. Right upper quadrant ultrasound was not performed secondary to low concern. Remainder of her liver function panel was normal indicating no evidence of acute  obstruction. She continues to have pain. I discussed the case with her primary care physician told that the patient for further evaluation and pain management        Dayton Bailiff, MD 10/24/11 1911

## 2011-10-24 NOTE — ED Notes (Signed)
Report called to Nadine Counts, RN on 300.  Patient will be transported to floor after CT scan.

## 2011-10-24 NOTE — H&P (Signed)
[name], [name]            ACCOUNT NO.:  [id number]  MEDICAL RECORD NO.:  [id number]  LOCATION:  A336                          FACILITY:  [facility]  PHYSICIAN:  Mila Homer. Sudie Bailey, M.D.DATE OF BIRTH:  Jul 23, 1986  DATE OF ADMISSION:  10/24/2011 DATE OF DISCHARGE:  [facility]                             HISTORY & PHYSICAL   This 25 year old developed severe pain in the right flank and CVA regions starting a couple days ago.  This seemed to radiate around to the right lower quadrant of the abdomen.  She then developed nausea and vomiting with this this morning and came to the [facility] Emergency Department this afternoon for evaluation.  She does give me a history of having kidney stones x2.  She also has had suprapubic tenderness with this.  She denies fever and chills.  She has had no shortness of breath, chest pain, or thoracic symptoms with this. She did not have any increased activity or overuse to explain the symptoms.  Review of the old record showed a history of hypertension and sleep apnea.  On my exam, she is semi-recumbent in bed, ready to watch television after having received Dilaudid, Toradol, and morphine IV as well as a bolus of a 1000 mL normal saline.  There are family members in the room with her.  She was somewhat hungry at this time, and the nausea had cleared after ondansetron IV.  Her temperature is 97.7, pulse 77, respiratory rate 20, blood pressure 140/86.  Her speech was normal.  Sentence structure intact.  She appeared to be a good historian. Mucous membranes are moist.  Skin turgor normal. Her heart had a regular rhythm and rate of about 80, but heart sounds were distant. Her lungs were clear throughout but lung sounds distant.  She appeared morbidly obese.  Her abdomen was soft and obese without organomegaly or mass and minimal suprapubic tenderness.  She did have tenderness in the right CVA and flank but not with punch. She had no  edema of the ankles or feet.  Her admission white cell count was 8500, of which 51% were neutrophils, 33 lymphs.  Her hemoglobin was 11.7, and her CMP was essentially normal except lipase was minimally elevated at 165 (normal 11-59).  Her UA was hazy with specific gravity of 1.020, pH 6.0, but only 0-2 WBCs, and 0-2 RBCs per HPF.  She had a few bacteria noted.  The renal ultrasound showed cholelithiasis.  A CT of the abdomen and pelvis did show a solitary 3 cm diameter gallstone but no cholecystitis.  There is no hydronephrosis or other abnormality to explain her pain. This is compared to her CT scan done roughly a month ago, at which time she had some minimal right-sided hydronephrosis with a 4 x3 mm stone at the level of the distal right ureter.  ADMISSION DIAGNOSES: 1. Right cerebrovascular accident and flank pain, question etiology. 2. Elevated lipase without pancreatitis on CT scan. 3. Morbid obesity. 4. Hypertension. 5. History of sleep apnea, based on the electronic medical record. 6. Cholelithiasis. She is currently admitted to the hospital on IV morphine and Zofran as well as IV fluids.  We will check her lipase, CBC,  and CMP in the morning.  Will get x-rays of her thoracic and lumbar spine.  I discussed all this with the patient and family.  She will be seen by her LMD, Dr. Felecia Shelling, in the morning.     Mila Homer. Sudie Bailey, M.D.     SDK/MEDQ  D:  10/24/2011  T:  10/24/2011  Job:  [id number]

## 2011-10-24 NOTE — ED Notes (Signed)
Pt c/o left flank pain since Friday night.  Reports started vomiting today.

## 2011-10-24 NOTE — ED Notes (Signed)
Labs drawn.  Pain medication given.  Denies nausea at this time. Requesting ice or something po

## 2011-10-24 NOTE — ED Notes (Signed)
Patient returned from CT scan via stretcher.  Patient being transported to Room 336.

## 2011-10-25 ENCOUNTER — Inpatient Hospital Stay (HOSPITAL_COMMUNITY): Payer: Self-pay

## 2011-10-25 LAB — COMPREHENSIVE METABOLIC PANEL
ALT: 9 U/L (ref 0–35)
AST: 19 U/L (ref 0–37)
CO2: 27 mEq/L (ref 19–32)
Chloride: 104 mEq/L (ref 96–112)
Creatinine, Ser: 0.6 mg/dL (ref 0.50–1.10)
GFR calc non Af Amer: >90 mL/min (ref >90)
Total Bilirubin: 0.3 mg/dL (ref 0.3–1.2)

## 2011-10-25 LAB — DIFFERENTIAL
Basophils Absolute: 0 10*3/uL (ref 0.0–0.1)
Lymphocytes Relative: 58 % — ABNORMAL HIGH (ref 12–46)
Lymphs Abs: 3.6 10*3/uL (ref 0.7–4.0)
Monocytes Absolute: 0.5 10*3/uL (ref 0.1–1.0)
Neutro Abs: 2 10*3/uL (ref 1.7–7.7)

## 2011-10-25 LAB — CBC
HCT: 33.9 % — ABNORMAL LOW (ref 36.0–46.0)
Hemoglobin: 11.4 g/dL — ABNORMAL LOW (ref 12.0–15.0)
RBC: 3.83 MIL/uL — ABNORMAL LOW (ref 3.87–5.11)
RDW: 12.9 % (ref 11.5–15.5)
WBC: 6.3 10*3/uL (ref 4.0–10.5)

## 2011-10-25 NOTE — Progress Notes (Signed)
[name], [name]            ACCOUNT NO.:  [id number]  MEDICAL RECORD NO.:  [id number]  LOCATION:  A336                          FACILITY:  [facility]  PHYSICIAN:  Reagan Behlke D. Felecia Shelling, MD   DATE OF BIRTH:  07-24-1986  DATE OF PROCEDURE:  10/25/2011 DATE OF DISCHARGE:                                PROGRESS NOTE   SUBJECTIVE:  The patient complains of right upper quadrant pain and nausea.  Her vomiting has stopped.  OBJECTIVE:  GENERAL:  The patient is alert, awake, and sick looking. VITAL SIGNS:  Blood pressure 122/77, pulse 63, respiratory rate 18, temperature 97 degrees Fahrenheit. CHEST:  Clear lung fields.  Good air entry. CARDIOVASCULAR SYSTEM:  First and second heart sounds heard.  No murmur. No gallop. ABDOMEN:  Soft and lax.  Bowel sound is positive.  No mass or organomegaly. EXTREMITIES:  No leg edema.  LABORATORY DATA:  Sodium 138, potassium 3.4, chloride 104, carbon dioxide 27, glucose 101, BUN 4, creatinine 0.6, calcium 9.3, albumin 7.0, lipase 43.  ASSESSMENT: 1. Abdominal pain, nausea, and vomiting, etiology is not clear at this     time. 2. Cholelithiasis. 3. History of urinary tract infection. 4. History of renal calculi.  PLAN:  We will continue the patient on current medications.  We will do Surgical consult for evaluation of her abdominal pain and cholelithiasis and continue regular treatment.     Gotham Raden D. Felecia Shelling, MD     TDF/MEDQ  D:  10/25/2011  T:  10/25/2011  Job:  [id number]

## 2011-10-25 NOTE — Consult Note (Signed)
Reason for Consult: Acute pancreatitis Referring Physician: Dr. [name] is an 25 y.o. female.  HPI: Patient presented to [facility] emergency department with approximately one week history of epigastric, pain. Pain is been persistent. Some radiation to the right upper quadrant and around to the back. No similar symptomatology in the past. She has had associated nausea but no emesis. Some subjective fevers and chills. She denies any jaundice. No change in bowel movements. No melena no hematochezia. No change in urination. No sick contacts. No new food exposures or recent travel. Patient does have significant family history of biliary disease. No pregnancies.  Past Medical History  Diagnosis Date  . Hypertension   . Polycystic ovarian syndrome   . Bronchitis   . Abnormal vaginal bleeding   . Sleep apnea   . Renal disorder     kidney stone    Past Surgical History  Procedure Date  . Tonsillectomy     History reviewed. No pertinent family history.  Social History:  reports that she has never smoked. She does not have any smokeless tobacco history on file. She reports that she drinks alcohol. She reports that she does not use illicit drugs.  Allergies: No Known Allergies  Medications:  I have reviewed the patient's current medications. Prior to Admission:  Prescriptions prior to admission  Medication Sig Dispense Refill  . ibuprofen (ADVIL,MOTRIN) 200 MG tablet Take 400 mg by mouth every 8 (eight) hours as needed. Headaches/pain       . lisinopril-hydrochlorothiazide (PRINZIDE,ZESTORETIC) 20-25 MG per tablet Take 1 tablet by mouth daily.         Scheduled:   .  HYDROmorphone (DILAUDID) injection  1 mg Intravenous Once  . influenza  inactive virus vaccine  0.5 mL Intramuscular Tomorrow-1000  . ketorolac  30 mg Intravenous Once  . levofloxacin (LEVAQUIN) IV  500 mg Intravenous Q24H  .  morphine injection  4 mg Intravenous Once  . ondansetron  4 mg Intravenous  Once  . sodium chloride  1,000 mL Intravenous Once  . DISCONTD:  HYDROmorphone (DILAUDID) injection  1 mg Intravenous Once   Continuous:   . sodium chloride 50 mL/hr at 10/25/11 1610   RUE:[id number], iohexol, morphine injection, ondansetron (ZOFRAN) IV, ondansetron  Results for orders placed during the hospital encounter of 10/24/11 (from the past 48 hour(s))  URINALYSIS, ROUTINE W REFLEX MICROSCOPIC     Status: Abnormal   Collection Time   10/24/11  4:32 PM      Component Value Range Comment   Color, Urine YELLOW  YELLOW     APPearance HAZY (*) CLEAR     Specific Gravity, Urine 1.020  1.005 - 1.030     pH 6.0  5.0 - 8.0     Glucose, UA NEGATIVE  NEGATIVE (mg/dL)    Hgb urine dipstick TRACE (*) NEGATIVE     Bilirubin Urine NEGATIVE  NEGATIVE     Ketones, ur NEGATIVE  NEGATIVE (mg/dL)    Protein, ur NEGATIVE  NEGATIVE (mg/dL)    Urobilinogen, UA 0.2  0.0 - 1.0 (mg/dL)    Nitrite NEGATIVE  NEGATIVE     Leukocytes, UA NEGATIVE  NEGATIVE    URINE MICROSCOPIC-ADD ON     Status: Abnormal   Collection Time   10/24/11  4:32 PM      Component Value Range Comment   Squamous Epithelial / LPF MANY (*) RARE     WBC, UA 0-2  <3 (WBC/hpf)    RBC /  HPF 0-2  <3 (RBC/hpf)    Bacteria, UA FEW (*) RARE     Urine-Other MUCOUS PRESENT     POCT PREGNANCY, URINE     Status: Normal   Collection Time   10/24/11  4:34 PM      Component Value Range Comment   Preg Test, Ur NEGATIVE     CBC     Status: Abnormal   Collection Time   10/24/11  5:48 PM      Component Value Range Comment   WBC 8.5  4.0 - 10.5 (K/uL)    RBC 3.92  3.87 - 5.11 (MIL/uL)    Hemoglobin 11.7 (*) 12.0 - 15.0 (g/dL)    HCT 16.1 (*) 09.6 - 46.0 (%)    MCV 87.8  78.0 - 100.0 (fL)    MCH 29.8  26.0 - 34.0 (pg)    MCHC 34.0  30.0 - 36.0 (g/dL)    RDW 04.5  40.9 - 81.1 (%)    Platelets 217  150 - 400 (K/uL)   DIFFERENTIAL     Status: Abnormal   Collection Time   10/24/11  5:48 PM      Component Value Range Comment    Neutrophils Relative 51  43 - 77 (%)    Neutro Abs 4.3  1.7 - 7.7 (K/uL)    Lymphocytes Relative 33  12 - 46 (%)    Lymphs Abs 2.8  0.7 - 4.0 (K/uL)    Monocytes Relative 17 (*) 3 - 12 (%)    Monocytes Absolute 1.4 (*) 0.1 - 1.0 (K/uL)    Eosinophils Relative 0  0 - 5 (%)    Eosinophils Absolute 0.0  0.0 - 0.7 (K/uL)    Basophils Relative 0  0 - 1 (%)    Basophils Absolute 0.0  0.0 - 0.1 (K/uL)   COMPREHENSIVE METABOLIC PANEL     Status: Normal   Collection Time   10/24/11  5:48 PM      Component Value Range Comment   Sodium 139  135 - 145 (mEq/L)    Potassium 3.5  3.5 - 5.1 (mEq/L)    Chloride 107  96 - 112 (mEq/L)    CO2 25  19 - 32 (mEq/L)    Glucose, Bld 88  70 - 99 (mg/dL)    BUN 7  6 - 23 (mg/dL)    Creatinine, Ser 9.14  0.50 - 1.10 (mg/dL)    Calcium 9.0  8.4 - 10.5 (mg/dL)    Total Protein 6.8  6.0 - 8.3 (g/dL)    Albumin 3.6  3.5 - 5.2 (g/dL)    AST 16  0 - 37 (U/L)    ALT 10  0 - 35 (U/L)    Alkaline Phosphatase 90  39 - 117 (U/L)    Total Bilirubin 0.4  0.3 - 1.2 (mg/dL)    GFR calc non Af Amer >90  >90 (mL/min)    GFR calc Af Amer >90  >90 (mL/min)   LIPASE, BLOOD     Status: Abnormal   Collection Time   10/24/11  5:48 PM      Component Value Range Comment   Lipase 165 (*) 11 - 59 (U/L)   CBC     Status: Abnormal   Collection Time   10/25/11  5:21 AM      Component Value Range Comment   WBC 6.3  4.0 - 10.5 (K/uL)    RBC 3.83 (*) 3.87 - 5.11 (MIL/uL)  Hemoglobin 11.4 (*) 12.0 - 15.0 (g/dL)    HCT 16.1 (*) 09.6 - 46.0 (%)    MCV 88.5  78.0 - 100.0 (fL)    MCH 29.8  26.0 - 34.0 (pg)    MCHC 33.6  30.0 - 36.0 (g/dL)    RDW 04.5  40.9 - 81.1 (%)    Platelets 223  150 - 400 (K/uL)   DIFFERENTIAL     Status: Abnormal   Collection Time   10/25/11  5:21 AM      Component Value Range Comment   Neutrophils Relative 33 (*) 43 - 77 (%)    Neutro Abs 2.0  1.7 - 7.7 (K/uL)    Lymphocytes Relative 58 (*) 12 - 46 (%)    Lymphs Abs 3.6  0.7 - 4.0 (K/uL)     Monocytes Relative 8  3 - 12 (%)    Monocytes Absolute 0.5  0.1 - 1.0 (K/uL)    Eosinophils Relative 1  0 - 5 (%)    Eosinophils Absolute 0.1  0.0 - 0.7 (K/uL)    Basophils Relative 0  0 - 1 (%)    Basophils Absolute 0.0  0.0 - 0.1 (K/uL)   COMPREHENSIVE METABOLIC PANEL     Status: Abnormal   Collection Time   10/25/11  5:21 AM      Component Value Range Comment   Sodium 138  135 - 145 (mEq/L)    Potassium 3.4 (*) 3.5 - 5.1 (mEq/L)    Chloride 104  96 - 112 (mEq/L)    CO2 27  19 - 32 (mEq/L)    Glucose, Bld 101 (*) 70 - 99 (mg/dL)    BUN 4 (*) 6 - 23 (mg/dL)    Creatinine, Ser 9.14  0.50 - 1.10 (mg/dL)    Calcium 9.3  8.4 - 10.5 (mg/dL)    Total Protein 7.0  6.0 - 8.3 (g/dL)    Albumin 3.5  3.5 - 5.2 (g/dL)    AST 19  0 - 37 (U/L)    ALT 9  0 - 35 (U/L)    Alkaline Phosphatase 95  39 - 117 (U/L)    Total Bilirubin 0.3  0.3 - 1.2 (mg/dL)    GFR calc non Af Amer >90  >90 (mL/min)    GFR calc Af Amer >90  >90 (mL/min)   LIPASE, BLOOD     Status: Normal   Collection Time   10/25/11  5:21 AM      Component Value Range Comment   Lipase 43  11 - 59 (U/L)     Ct Abdomen Pelvis W Contrast  10/24/2011  *RADIOLOGY REPORT*  Clinical Data: Abdominal pain  CT ABDOMEN AND PELVIS WITH CONTRAST  Technique:  Multidetector CT imaging of the abdomen and pelvis was performed following the standard protocol during bolus administration of intravenous contrast.  Contrast: OMNIPAQUE IOHEXOL 300 MG/ML IV SOLN  Comparison: 09/23/2011  Findings: Limited images through the lung bases demonstrate no significant appreciable abnormality. The heart size is within normal limits. No pleural or pericardial effusion.  Low attenuation of the liver is in keeping with fatty infiltration. Cholelithiasis.  No CT evidence for cholecystitis.  No biliary ductal dilatation.  Unremarkable spleen, pancreas, adrenal glands.  Symmetric renal size and enhancement.  No hydronephrosis or hydroureter.  No bowel obstruction.  No  CT evidence for colitis.  Normal appendix.  No free intraperitoneal air or fluid.  No lymphadenopathy.  Normal caliber vasculature.  Thin-walled bladder.  Uterus  and adnexa within normal limits for CT.  No acute osseous abnormality.  IMPRESSION: No acute CT abnormality identified.  Normal appendix.  No hydronephrosis.  Cholelithiasis without CT evidence for cholecystitis.  No biliary ductal dilatation.  Despite the elevated lipase, pancreas has a normal CT appearance.  Original Report Authenticated By: Waneta Martins, M.D.   US Renal  10/24/2011  *RADIOLOGY REPORT*  Clinical Data: Possible right ureteral stone.  Evaluate for hydronephrosis.  RENAL/URINARY TRACT ULTRASOUND COMPLETE  Comparison:  CT 09/23/2011.  Findings:  Right Kidney:  10.8 cm. Normal size and echotexture.  No focal abnormality.  No hydronephrosis.  Left Kidney:  11.4 cm. Normal size and echotexture.  No focal abnormality.  No hydronephrosis.  Bladder:  Decompressed, grossly unremarkable.  Incidentally noted is a 3 cm gallstone within the gallbladder.  IMPRESSION: No acute findings within the kidneys.  No hydronephrosis.  Cholelithiasis.  Original Report Authenticated By: Cyndie Chime, M.D.    Review of Systems  Constitutional: Positive for fever and chills. Negative for weight loss, malaise/fatigue and diaphoresis.  HENT: Negative.   Eyes: Negative.   Respiratory: Negative.   Cardiovascular: Negative.   Gastrointestinal: Positive for nausea and abdominal pain (epigastric and right upper quadrant.). Negative for heartburn, vomiting, diarrhea, constipation and melena.  Genitourinary: Negative.   Musculoskeletal: Negative.   Skin: Negative.   Neurological: Negative.  Negative for weakness.  Endo/Heme/Allergies: Negative.   Psychiatric/Behavioral: Negative.    Blood pressure 119/81, pulse 65, temperature 98.1 F (36.7 [name]), temperature source Oral, resp. rate 20, height 5\' 7"  (1.702 m), weight 127.9 kg (281 lb 15.5 oz), last  menstrual period 10/06/2011, SpO2 95.00%. Physical Exam  Constitutional: She is oriented to person, place, and time. She appears well-developed and well-nourished. No distress.       Morbidly obese  HENT:  Head: Normocephalic and atraumatic.  Eyes: Conjunctivae and EOM are normal. Pupils are equal, round, and reactive to light. No scleral icterus.  Neck: Normal range of motion. Neck supple. No tracheal deviation present. No thyromegaly present.  Cardiovascular: Normal rate, regular rhythm and normal heart sounds.   Respiratory: Effort normal and breath sounds normal. No respiratory distress. She has no wheezes.  GI: Soft. Bowel sounds are normal. She exhibits no distension and no mass. There is tenderness (mild epigastric and right upper quadrant abdominal pain. No diffuse peritoneal signs.). There is no rebound and no guarding.  Musculoskeletal: Normal range of motion.  Lymphadenopathy:    She has no cervical adenopathy.  Neurological: She is alert and oriented to person, place, and time.  Skin: Skin is warm and dry.    Assessment/Plan: Acute pancreatitis. Suspected gallstone etiology. No evidence of acute cholecystitis. I did discuss with the patient typical management of acute pancreatitis. At this point I do suspect only a mild case of pancreatitis due to the patient's improving symptomatology and improving pancreatic enzymes. At this point I would continue another 24 hours of IV fluid hydration and bowel rest. However should her symptomatology improved tomorrow I would start to advance her diet. We discussed options including surgical intervention and cholecystectomy prior to discharge versus interval elective cholecystectomy in the future. At this point we'll continue conservative management of her pancreatitis and again it should she demonstrate to need improvement will address surgical timing with the patient in the next 24-48 hours. Patient's questions and concerns were  addressed.  [name] 10/25/2011, 3:25 PM

## 2011-10-26 MED ORDER — SODIUM CHLORIDE 0.9 % IJ SOLN
3.0000 mL | Freq: Two times a day (BID) | INTRAMUSCULAR | Status: DC
  Administered 2011-10-26 – 2011-10-28 (×4): 3 mL via INTRAVENOUS
  Filled 2011-10-26 (×2): qty 3

## 2011-10-26 MED ORDER — LOPERAMIDE HCL 2 MG PO CAPS
4.0000 mg | ORAL_CAPSULE | Freq: Once | ORAL | Status: AC
  Administered 2011-10-26: 4 mg via ORAL
  Filled 2011-10-26: qty 2

## 2011-10-26 MED ORDER — LOPERAMIDE HCL 2 MG PO CAPS: 2.0000 mg | ORAL_CAPSULE | ORAL | Status: DC | PRN

## 2011-10-26 NOTE — Progress Notes (Signed)
NAMEPROMYSE, ARDITO            ACCOUNT NO.:  [id number]  MEDICAL RECORD NO.:  [id number]  LOCATION:  A336                          FACILITY:  [facility]  PHYSICIAN:  Mohd Clemons D. Felecia Shelling, MD   DATE OF BIRTH:  03/19/1986  DATE OF PROCEDURE:  10/26/2011 DATE OF DISCHARGE:                                PROGRESS NOTE   SUBJECTIVE:  The patient feels slightly better.  Her abdominal pain is less.  She has no nausea or vomiting.  The patient overall feels better.  OBJECTIVE:  GENERAL:  The patient is alert, awake. VITAL SIGNS:  Blood pressure 119/81, pulse 61, respiratory rate 18, temperature 97.7 degrees Fahrenheit. CHEST:  Clear lung fields.  Good air entry. CARDIOVASCULAR SYSTEM:  First and second heart sounds heard.  No murmur. No gallop. ABDOMEN:  Soft and lax.  Bowel sound is positive.  No mass or organomegaly. EXTREMITIES:  No leg edema.  ASSESSMENT: 1. Abdominal pain, nausea, vomiting, probably secondary to mild     pancreatitis. 2. Cholelithiasis. 3. Obesity.  PLAN:  Surgical consult is appreciated.  We will start on liquid diet. We will continue pain management.  We will continue surgical recommendations.     Petra Dumler D. Felecia Shelling, MD     TDF/MEDQ  D:  10/26/2011  T:  10/26/2011  Job:  [id number]

## 2011-10-26 NOTE — Progress Notes (Signed)
Subjective: Some complaints of nausea with liquids this morning. No fevers or chills. No increase in abdominal pain  Objective: Vital signs in last 24 hours: Temp:  [97.7 F (36.5 C)-98.4 F (36.9 C)] 98.4 F (36.9 C) (12/19 1038) Pulse Rate:  [61-68] 68  (12/19 1038) Resp:  [18-20] 18  (12/19 1038) BP: (119-158)/(74-99) 142/81 mmHg (12/19 1038) SpO2:  [94 %-100 %] 98 % (12/19 1038) Last BM Date: 10/24/11  Intake/Output from previous day: 12/18 0701 - 12/19 0700 In: 779.2 [I.V.:779.2] Out: 550 [Urine:550] Intake/Output this shift: Total I/O In: 360 [P.O.:360] Out: -   General appearance: alert and no distress GI: Positive bowel sounds, soft, obese, mild epigastric tenderness, no peritoneal signs.  Lab Results:   [facility] 10/25/11 0521 10/24/11 1748  WBC 6.3 8.5  HGB 11.4* 11.7*  HCT 33.9* 34.4*  PLT 223 217   BMET  Basename 10/25/11 0521 10/24/11 1748  NA 138 139  K 3.4* 3.5  CL 104 107  CO2 27 25  GLUCOSE 101* 88  BUN 4* 7  CREATININE 0.60 0.61  CALCIUM 9.3 9.0   PT/INR No results found for this basename: LABPROT:2,INR:2 in the last 72 hours ABG No results found for this basename: PHART:2,PCO2:2,PO2:2,HCO3:2 in the last 72 hours  Studies/Results: Dg Thoracic Spine 2 View  10/25/2011  *RADIOLOGY REPORT*  Clinical Data: Low back pain.  Right flank pain.  THORACIC SPINE - 2 VIEW  Comparison: 10/24/2011.  Findings: Anatomic alignment.  Vertebral body height and intervertebral disc space appears preserved.  Cervicothoracic junction appears normal.  No fracture or destructive osseous lesion identified.  IMPRESSION: Negative thoracic spine radiographs.  Original Report Authenticated By: Andreas Newport, M.D.   Dg Lumbar Spine 2-3 Views  10/25/2011  *RADIOLOGY REPORT*  Clinical Data: Low back pain since Friday.  Acute onset low back pain.  LUMBAR SPINE - 2-3 VIEW  Comparison: 10/24/2011 abdominal CT.  Findings: Five lumbar type vertebral bodies.  Alignment is  anatomic.  Intervertebral disc space preserved.  Vertebral body height preserved.  No fracture.  No displaced pars defects identified. Ingested oral contrast is present in the colon.  IMPRESSION: Negative lumbar spine radiographs.  Original Report Authenticated By: Andreas Newport, M.D.   Ct Abdomen Pelvis W Contrast  10/24/2011  *RADIOLOGY REPORT*  Clinical Data: Abdominal pain  CT ABDOMEN AND PELVIS WITH CONTRAST  Technique:  Multidetector CT imaging of the abdomen and pelvis was performed following the standard protocol during bolus administration of intravenous contrast.  Contrast: OMNIPAQUE IOHEXOL 300 MG/ML IV SOLN  Comparison: 09/23/2011  Findings: Limited images through the lung bases demonstrate no significant appreciable abnormality. The heart size is within normal limits. No pleural or pericardial effusion.  Low attenuation of the liver is in keeping with fatty infiltration. Cholelithiasis.  No CT evidence for cholecystitis.  No biliary ductal dilatation.  Unremarkable spleen, pancreas, adrenal glands.  Symmetric renal size and enhancement.  No hydronephrosis or hydroureter.  No bowel obstruction.  No CT evidence for colitis.  Normal appendix.  No free intraperitoneal air or fluid.  No lymphadenopathy.  Normal caliber vasculature.  Thin-walled bladder.  Uterus and adnexa within normal limits for CT.  No acute osseous abnormality.  IMPRESSION: No acute CT abnormality identified.  Normal appendix.  No hydronephrosis.  Cholelithiasis without CT evidence for cholecystitis.  No biliary ductal dilatation.  Despite the elevated lipase, pancreas has a normal CT appearance.  Original Report Authenticated By: Waneta Martins, M.D.   US Renal  10/24/2011  *RADIOLOGY  REPORT*  Clinical Data: Possible right ureteral stone.  Evaluate for hydronephrosis.  RENAL/URINARY TRACT ULTRASOUND COMPLETE  Comparison:  CT 09/23/2011.  Findings:  Right Kidney:  10.8 cm. Normal size and echotexture.  No focal  abnormality.  No hydronephrosis.  Left Kidney:  11.4 cm. Normal size and echotexture.  No focal abnormality.  No hydronephrosis.  Bladder:  Decompressed, grossly unremarkable.  Incidentally noted is a 3 cm gallstone within the gallbladder.  IMPRESSION: No acute findings within the kidneys.  No hydronephrosis.  Cholelithiasis.  Original Report Authenticated By: Cyndie Chime, M.D.    Anti-infectives: Anti-infectives     Start     Dose/Rate Route Frequency Ordered Stop   10/24/11 2300   levofloxacin (LEVAQUIN) IVPB 500 mg        500 mg 100 mL/hr over 60 Minutes Intravenous Every 24 hours 10/24/11 2217            Assessment/Plan: s/p  Acute pancreatitis suspect gallstone etiology. At this point her last have continue to normalize. I would continue to advance diet as tolerated slowly. With her associated nausea do suspect this is still some residual effects from her pancreatitis and did advise her to continue slowly with her advancement of her diet. Again we discussed timing for surgical intervention.  At this point should she continue to tolerated diet we will either addressed the gallbladder prior to discharge or collectively as an outpatient. Patient is still wearing her options.  LOS: 2 days    [name] C 10/26/2011

## 2011-10-27 MED ORDER — SODIUM CHLORIDE 0.9 % IJ SOLN
INTRAMUSCULAR | Status: AC
  Filled 2011-10-27: qty 3

## 2011-10-27 NOTE — Progress Notes (Signed)
[name], [name]            ACCOUNT NO.:  [id number]  MEDICAL RECORD NO.:  [id number]  LOCATION:  A336                          FACILITY:  [facility]  PHYSICIAN:  Tondra Reierson D. Felecia Shelling, MD   DATE OF BIRTH:  02/10/1986  DATE OF PROCEDURE:  10/27/2011 DATE OF DISCHARGE:                                PROGRESS NOTE   SUBJECTIVE:  The patient still has nausea.  She did not have any vomiting.  She had an episode of diarrhea yesterday.  The patient want to have her cholecystectomy done before she is discharged.  Surgery is evaluating her condition.  PHYSICAL EXAMINATION:  VITAL SIGNS:  Blood pressure 120/86, pulse 64, respiratory rate 18, temperature 98 degrees Fahrenheit. CHEST:  Clear lung fields.  Good air entry. CARDIOVASCULAR SYSTEM:  First and second heart sounds heard.  No murmur. No gallop. ABDOMEN:  Soft and lax.  Bowel sound is positive.  No mass or organomegaly. EXTREMITIES:  No leg edema.  ASSESSMENT: 1. Probably pancreatitis, clinically improved. 2. Cholelithiasis. 3. Obesity. 4. Diarrhea.  PLAN:  We will continue the patient on current medications.  The patient is waiting for possible cholecystectomy before discharge and will continue to monitor her CBC, BMP, and continue supportive care.     Kais Monje D. Felecia Shelling, MD     TDF/MEDQ  D:  10/27/2011  T:  10/27/2011  Job:  [id number]

## 2011-10-27 NOTE — Progress Notes (Signed)
  Subjective: Still with some nausea.  Diarrhea after clears today.  No increase abdominal pain.  No fever or chills.  Objective: Vital signs in last 24 hours: Temp:  [98 F (36.7 C)-98.6 F (37 C)] 98.2 F (36.8 C) (12/20 0652) Pulse Rate:  [64-78] 64  (12/20 0652) Resp:  [20] 20  (12/20 0652) BP: (120-167)/(86-97) 152/97 mmHg (12/20 0652) SpO2:  [99 %-100 %] 100 % (12/20 0652) Last BM Date: 10/26/11  Intake/Output from previous day: 12/19 0701 - 12/20 0700 In: 4261.8 [P.O.:960; I.V.:3201.8; IV Piggyback:100] Out: 1850 [Urine:1850] Intake/Output this shift:    General appearance: alert and no distress GI: +BS, soft, obese, mild tenderness.  NO peritoneal signs.  Lab Results:   [facility] 10/25/11 0521  WBC 6.3  HGB 11.4*  HCT 33.9*  PLT 223   BMET  Basename 10/25/11 0521  NA 138  K 3.4*  CL 104  CO2 27  GLUCOSE 101*  BUN 4*  CREATININE 0.60  CALCIUM 9.3   PT/INR No results found for this basename: LABPROT:2,INR:2 in the last 72 hours ABG No results found for this basename: PHART:2,PCO2:2,PO2:2,HCO3:2 in the last 72 hours  Studies/Results: No results found.  Anti-infectives: Anti-infectives     Start     Dose/Rate Route Frequency Ordered Stop   10/24/11 2300   levofloxacin (LEVAQUIN) IVPB 500 mg        500 mg 100 mL/hr over 60 Minutes Intravenous Every 24 hours 10/24/11 2217            Assessment/Plan: s/p  Acute pancreatitis.  Suspect loose stool secondary to pancreatitis.  Overall I feel it is resolving but would continue to advance diet slowly.  If diarrhea continues would consider checking stool for c-diff although my suspicion is low.  LOS: 3 days    Breean Nannini C 10/27/2011

## 2011-10-28 LAB — BASIC METABOLIC PANEL
BUN: 5 mg/dL — ABNORMAL LOW (ref 6–23)
Calcium: 9.2 mg/dL (ref 8.4–10.5)
Creatinine, Ser: 0.7 mg/dL (ref 0.50–1.10)
GFR calc Af Amer: >90 mL/min (ref >90)
GFR calc non Af Amer: >90 mL/min (ref >90)

## 2011-10-28 LAB — CBC
HCT: 35.6 % — ABNORMAL LOW (ref 36.0–46.0)
MCHC: 34.3 g/dL (ref 30.0–36.0)
MCV: 86.8 fL (ref 78.0–100.0)
RDW: 12.6 % (ref 11.5–15.5)

## 2011-10-28 MED ORDER — SODIUM CHLORIDE 0.9 % IJ SOLN
INTRAMUSCULAR | Status: AC
  Filled 2011-10-28: qty 3

## 2011-10-28 NOTE — Progress Notes (Signed)
  Subjective: Nausea slightly better.  Tolerated some oatmeal.  No increased pain.  Objective: Vital signs in last 24 hours: Temp:  [97.9 F (36.6 C)-98.2 F (36.8 C)] 97.9 F (36.6 C) (12/21 1058) Pulse Rate:  [64-69] 64  (12/21 1058) Resp:  [16-20] 16  (12/21 1058) BP: (116-149)/(74-95) 149/95 mmHg (12/21 1058) SpO2:  [97 %-99 %] 97 % (12/21 1058) Last BM Date: 10/26/11  Intake/Output from previous day: 12/20 0701 - 12/21 0700 In: 400 [P.O.:400] Out: 1500 [Urine:1500] Intake/Output this shift: Total I/O In: 400 [P.O.:400] Out: -   General appearance: alert and no distress GI: +BS, soft, obese, mild tenderness in epigastric region.  No peritoneal signs.  Lab Results:   [facility] 10/28/11 0457  WBC 5.4  HGB 12.2  HCT 35.6*  PLT 272   BMET  Basename 10/28/11 0457  NA 137  K 3.7  CL 103  CO2 27  GLUCOSE 90  BUN 5*  CREATININE 0.70  CALCIUM 9.2   PT/INR No results found for this basename: LABPROT:2,INR:2 in the last 72 hours ABG No results found for this basename: PHART:2,PCO2:2,PO2:2,HCO3:2 in the last 72 hours  Studies/Results: No results found.  Anti-infectives: Anti-infectives     Start     Dose/Rate Route Frequency Ordered Stop   10/24/11 2300   levofloxacin (LEVAQUIN) IVPB 500 mg        500 mg 100 mL/hr over 60 Minutes Intravenous Every 24 hours 10/24/11 2217            Assessment/Plan: s/p  Acute pancreatitis.  Slow resolution.  At this point would continue to slowly advance diet as tolerated.  Hopeful d/c next 24-48 hrs.  Will tentatively plan for elective outpt chole.  LOS: 4 days    Melissaann Dizdarevic C 10/28/2011

## 2011-10-28 NOTE — Progress Notes (Signed)
Discussed with infection control that pt is on contact and has had no diarrhea since 10/26/11 and we are unable to get cdiff pcr. Recommended to speak with MD.  Ranae Plumber spoke with MD and agreed to DC contact precautions and cdiff PCR.  Also received order for VTE prophylaxis and pt made aware of new orders.

## 2011-10-29 NOTE — Progress Notes (Signed)
[name], [name]            ACCOUNT NO.:  [id number]  MEDICAL RECORD NO.:  [id number]  LOCATION:  A336                          FACILITY:  [facility]  PHYSICIAN:  Zori Benbrook D. Felecia Shelling, MD   DATE OF BIRTH:  28-Oct-1986  DATE OF PROCEDURE:  10/28/2011 DATE OF DISCHARGE:                                PROGRESS NOTE   SUBJECTIVE:  The patient still complains of abdominal pain and some nausea.  She has vomited only once yesterday.  The patient is on a clear liquid diet.  OBJECTIVE:  GENERAL:  The patient is alert, awake, and resting. VITAL SIGNS:  Blood pressure 116/74, pulse 67, respiratory rate 16, temperature 97.7 degrees Fahrenheit. CHEST:  Clear lung fields.  Good air entry. CARDIOVASCULAR SYSTEM:  First and second heart sounds heard.  No murmur. No gallop. ABDOMEN:  Soft and lax.  Bowel sound is positive.  No mass or organomegaly. EXTREMITIES:  No leg edema.  LABORATORY DATA:  Lipase 31.  CBC:  WBC 5.4, hemoglobin 12.2, hematocrit 35.5, and platelets 272.  Sodium 137, potassium 3.7, chloride 103, carbon dioxide 27, glucose 90, BUN 5, creatinine 0.7, and calcium 9.2.  ASSESSMENT: 1. Probably pancreatitis. 2. Cholelithiasis. 3. Obesity.  PLAN:  We will continue the patient on current medications.  We will continue according to Surgical recommendation.     Keddrick Wyne D. Felecia Shelling, MD     TDF/MEDQ  D:  10/28/2011  T:  10/28/2011  Job:  [id number]

## 2011-10-29 NOTE — Progress Notes (Signed)
NAMECHRISANNA, [name]            ACCOUNT NO.:  [id number]  MEDICAL RECORD NO.:  [id number]  LOCATION:  A336                          FACILITY:  [facility]  PHYSICIAN:  Kestrel Mis D. Felecia Shelling, MD   DATE OF BIRTH:  January 20, 1986  DATE OF PROCEDURE:  10/29/2011 DATE OF DISCHARGE:                                PROGRESS NOTE   SUBJECTIVE:  The patient feels slightly better.  Her nausea is improving.  She had no vomiting.  OBJECTIVE:  GENERAL:  The patient is alert, awake, and resting. VITAL SIGNS:  Blood pressure 130/73, pulse 64, respiratory rate 16, temperature 97.2 degrees Fahrenheit. CHEST:  Clear lung fields.  Good air entry. CARDIOVASCULAR SYSTEM:  First and second heart sounds heard.  No murmur. No gallop. ABDOMEN:  Soft and lax.  Bowel sound is positive.  No mass or organomegaly. EXTREMITIES:  No leg edema.  ASSESSMENT: 1. Pancreatitis, clinically improving. 2. Cholelithiasis. 3. Obesity.  PLAN:  We will advance her diet to full liquid diet.  Continue pain medications.  Once the patient tolerates her diet, we will plan to discharge the patient and follow up on outpatient.     Jermanie Minshall D. Felecia Shelling, MD     TDF/MEDQ  D:  10/29/2011  T:  10/29/2011  Job:  [id number]

## 2011-10-29 NOTE — Progress Notes (Signed)
  Subjective: No change.   Objective: Vital signs in last 24 hours: Temp:  [97.2 F (36.2 C)-98.4 F (36.9 C)] 97.2 F (36.2 C) (12/22 0604) Pulse Rate:  [65-73] 65  (12/22 0604) Resp:  [16] 16  (12/22 0604) BP: (130-157)/(73-99) 130/73 mmHg (12/22 0604) SpO2:  [100 %] 100 % (12/22 0604) Weight:  [129.8 kg (286 lb 2.5 oz)] 286 lb 2.5 oz (129.8 kg) (12/22 0604) Last BM Date: 10/26/11  Intake/Output from previous day: 12/21 0701 - 12/22 0700 In: 400 [P.O.:400] Out: 325 [Urine:325] Intake/Output this shift: Total I/O In: -  Out: 600 [Urine:600]  General appearance: alert and no distress GI: soft, non-tender; bowel sounds normal; no masses,  no organomegaly  Lab Results:   [facility] 10/28/11 0457  WBC 5.4  HGB 12.2  HCT 35.6*  PLT 272   BMET  Basename 10/28/11 0457  NA 137  K 3.7  CL 103  CO2 27  GLUCOSE 90  BUN 5*  CREATININE 0.70  CALCIUM 9.2   PT/INR No results found for this basename: LABPROT:2,INR:2 in the last 72 hours ABG No results found for this basename: PHART:2,PCO2:2,PO2:2,HCO3:2 in the last 72 hours  Studies/Results: No results found.  Anti-infectives: Anti-infectives     Start     Dose/Rate Route Frequency Ordered Stop   10/24/11 2300   levofloxacin (LEVAQUIN) IVPB 500 mg        500 mg 100 mL/hr over 60 Minutes Intravenous Every 24 hours 10/24/11 2217            Assessment/Plan: s/p  Acute panc.  Resolving.  Advance diet.  Home tomorrow.   Will f/u as out pt.  LOS: 5 days    Lataisha Colan C 10/29/2011

## 2011-10-30 LAB — URINE CULTURE
Colony Count: 7000
Culture  Setup Time: 201212220349

## 2011-10-30 NOTE — Progress Notes (Signed)
D/c instructions reviewed with patient and mother.  Verbalized understanding.  Pt dc'd to home with mother.  Schonewitz, Candelaria Stagers 10/30/2011

## 2011-10-31 NOTE — Discharge Summary (Signed)
NAMEGRADIE, [name]            ACCOUNT NO.:  [id number]  MEDICAL RECORD NO.:  [id number]  LOCATION:  A336                          FACILITY:  [facility]  PHYSICIAN:  Jezreel Justiniano D. Felecia Shelling, MD   DATE OF BIRTH:  09/12/1986  DATE OF ADMISSION:  10/24/2011 DATE OF DISCHARGE:  12/23/2012LH                              DISCHARGE SUMMARY   DISCHARGE DIAGNOSES: 1. Pancreatitis. 2. Cholelithiasis. 3. Hypertension. 4. Sleep apnea syndrome. 5. Obesity.  DISCHARGE MEDICATIONS: 1. Ibuprofen 200 mg p.o. q.6 hours p.r.n. 2. Lisinopril HCT 20/25 one tablet p.o. daily p.r.n.  DISPOSITION:  The patient will be discharged home in stable condition.  DISCHARGE INSTRUCTIONS:  The patient is advised to be followed with Dr. Leticia Penna and Dr. Lovell Sheehan, general surgeon for her cholelithiasis and possible surgery in the long run.  LABORATORY DATA ON DISCHARGE:  CBC:  WBC 5.4, hemoglobin 12.2, hematocrit 35.6, and platelets 272.  BMP:  Sodium 137, potassium 3.7, chloride 103, carbon dioxide 27, glucose 90, BUN 5, creatinine 0.7, calcium 9.2.  Lipase level 43.  HOSPITAL COURSE:  This is a 25 year old female patient with history of hypertension, obesity, and sleep apnea who came to the emergency room due to nausea, vomiting, and abdominal pain.  Her CT scan of the abdomen showed cholelithiasis without any sign of cholecystitis.  Initially, her lipase was slightly elevated in the range of 165.  The patient was admitted as a possible case of pancreatitis.  Surgical consult was done. The patient improved.  She is going to be discharged home in stable condition and her cholecystectomy will be planned in outpatient.     [name] D. Felecia Shelling, MD    TDF/MEDQ  D:  10/30/2011  T:  10/31/2011  Job:  [id number]

## 2011-12-05 ENCOUNTER — Emergency Department (HOSPITAL_COMMUNITY)
Admission: EM | Admit: 2011-12-05 | Discharge: 2011-12-06 | Disposition: A | Discharge status: Home / Self Care | Payer: Self-pay | Attending: Emergency Medicine | Admitting: Emergency Medicine

## 2011-12-05 ENCOUNTER — Encounter (HOSPITAL_COMMUNITY): Payer: Self-pay | Admitting: *Deleted

## 2011-12-05 DIAGNOSIS — F17200 Nicotine dependence, unspecified, uncomplicated: Secondary | ICD-10-CM | POA: Insufficient documentation

## 2011-12-05 DIAGNOSIS — I10 Essential (primary) hypertension: Secondary | ICD-10-CM | POA: Insufficient documentation

## 2011-12-05 DIAGNOSIS — E282 Polycystic ovarian syndrome: Secondary | ICD-10-CM | POA: Insufficient documentation

## 2011-12-05 DIAGNOSIS — K8020 Calculus of gallbladder without cholecystitis without obstruction: Secondary | ICD-10-CM | POA: Insufficient documentation

## 2011-12-05 DIAGNOSIS — R10811 Right upper quadrant abdominal tenderness: Secondary | ICD-10-CM | POA: Insufficient documentation

## 2011-12-05 DIAGNOSIS — R1011 Right upper quadrant pain: Secondary | ICD-10-CM | POA: Insufficient documentation

## 2011-12-05 DIAGNOSIS — Z87442 Personal history of urinary calculi: Secondary | ICD-10-CM | POA: Insufficient documentation

## 2011-12-05 LAB — COMPREHENSIVE METABOLIC PANEL
AST: 15 U/L (ref 0–37)
BUN: 7 mg/dL (ref 6–23)
CO2: 25 mEq/L (ref 19–32)
Calcium: 9.4 mg/dL (ref 8.4–10.5)
Creatinine, Ser: 0.54 mg/dL (ref 0.50–1.10)
GFR calc Af Amer: >90 mL/min (ref >90)
GFR calc non Af Amer: >90 mL/min (ref >90)
Glucose, Bld: 115 mg/dL — ABNORMAL HIGH (ref 70–99)
Total Bilirubin: 0.1 mg/dL — ABNORMAL LOW (ref 0.3–1.2)

## 2011-12-05 LAB — CBC
HCT: 34.4 % — ABNORMAL LOW (ref 36.0–46.0)
Hemoglobin: 11.6 g/dL — ABNORMAL LOW (ref 12.0–15.0)
MCV: 85.8 fL (ref 78.0–100.0)
RDW: 12.8 % (ref 11.5–15.5)
WBC: 8.5 10*3/uL (ref 4.0–10.5)

## 2011-12-05 LAB — DIFFERENTIAL
Basophils Absolute: 0 10*3/uL (ref 0.0–0.1)
Eosinophils Relative: 1 % (ref 0–5)
Lymphocytes Relative: 50 % — ABNORMAL HIGH (ref 12–46)
Monocytes Absolute: 0.7 10*3/uL (ref 0.1–1.0)
Monocytes Relative: 9 % (ref 3–12)

## 2011-12-05 LAB — URINALYSIS, ROUTINE W REFLEX MICROSCOPIC
Bilirubin Urine: NEGATIVE
Ketones, ur: NEGATIVE mg/dL
Nitrite: NEGATIVE
Urobilinogen, UA: 0.2 mg/dL (ref 0.0–1.0)

## 2011-12-05 LAB — LIPASE, BLOOD: Lipase: 30 U/L (ref 11–59)

## 2011-12-05 MED ORDER — ONDANSETRON HCL 4 MG/2ML IJ SOLN
4.0000 mg | Freq: Once | INTRAMUSCULAR | Status: AC
  Administered 2011-12-05: 4 mg via INTRAVENOUS
  Filled 2011-12-05: qty 2

## 2011-12-05 MED ORDER — HYDROMORPHONE HCL PF 1 MG/ML IJ SOLN
1.0000 mg | Freq: Once | INTRAMUSCULAR | Status: AC
  Administered 2011-12-05: 1 mg via INTRAVENOUS
  Filled 2011-12-05: qty 1

## 2011-12-05 NOTE — ED Notes (Signed)
C/o abd pain x 3 days with N/V, denies diarrhea, LBM over a week per pt

## 2011-12-05 NOTE — ED Notes (Signed)
Pt care report given to Kennith Center, RN

## 2011-12-05 NOTE — ED Provider Notes (Addendum)
History   This chart was scribed for [employer]. Colon Branch, MD by Sofie Rower. The patient was seen in room APA01/APA01 and the patient's care was started at 11:12PM.    CSN: [id number]  Arrival date & time 12/05/11  2059   First MD Initiated Contact with Patient 12/05/11 2308      Chief Complaint  Patient presents with  . Abdominal Pain    (Consider location/radiation/quality/duration/timing/severity/associated sxs/prior treatment) HPI  Angel BATTS is a 26 y.o. female who presents to the Emergency Department complaining of moderate, waxing and wanning abdominal pain onset three days ago with associated symptoms of nausea, vomiting. Pt states the pain has gotten progressively worse up to a sharp pain today.Pt has not been able to keep anything down today. Pt denies fever, chills, diarrhea. Pt states it does not make a difference whether she eats or drinks, the pain is persistant. First episode of similar pain was a month ago and has occurred several times until that last three days when it has been frequent.  PCP is Dr. Felecia Shelling.  Past Medical History  Diagnosis Date  . Hypertension   . Polycystic ovarian syndrome   . Bronchitis   . Abnormal vaginal bleeding   . Sleep apnea   . Renal disorder     kidney stone    Past Surgical History  Procedure Date  . Tonsillectomy     No family history on file.  History  Substance Use Topics  . Smoking status: Current Everyday Smoker    Types: Cigarettes  . Smokeless tobacco: Not on file  . Alcohol Use: Yes     occasional    OB History    Grav Para Term Preterm Abortions TAB SAB Ect Mult Living                  Review of Systems  10 Systems reviewed and are negative for acute change except as noted in the HPI.   Allergies  Review of patient's allergies indicates no known allergies.  Home Medications   Current Outpatient Rx  Name Route Sig Dispense Refill  . IBUPROFEN 200 MG PO TABS Oral Take 400 mg by mouth every 8  (eight) hours as needed. Headaches/pain     . LISINOPRIL-HYDROCHLOROTHIAZIDE 20-25 MG PO TABS Oral Take 1 tablet by mouth daily.        BP 169/103  Pulse 76  Temp(Src) 98.2 F (36.8 C) (Oral)  Resp 20  Ht 5\' 7"  (1.702 m)  Wt 277 lb (125.646 kg)  BMI 43.38 kg/m2  SpO2 100%  LMP 11/14/2011  Physical Exam  Nursing note and vitals reviewed. Constitutional: She is oriented to person, place, and time. She appears well-developed and well-nourished.  HENT:  Head: Normocephalic and atraumatic.  Nose: Nose normal.  Eyes: Conjunctivae and EOM are normal. No scleral icterus.  Neck: Normal range of motion. Neck supple. No thyromegaly present.  Cardiovascular: Normal rate, regular rhythm and normal heart sounds.  Exam reveals no gallop and no friction rub.   No murmur heard. Pulmonary/Chest: Effort normal and breath sounds normal. No stridor. She has no wheezes. She has no rales. She exhibits no tenderness.  Abdominal: Soft. Bowel sounds are normal. She exhibits no distension. There is tenderness (RUQ). There is guarding (Mild). There is no rebound.  Musculoskeletal: Normal range of motion. She exhibits no edema.  Lymphadenopathy:    She has no cervical adenopathy.  Neurological: She is alert and oriented to person, place, and time.  Skin:  Skin is warm and dry.  Psychiatric: She has a normal mood and affect. Her behavior is normal.    ED Course  Procedures (including critical care time)  DIAGNOSTIC STUDIES: Oxygen Saturation is 100% on room air, normal by my interpretation.   Results for orders placed during the hospital encounter of 12/05/11  URINALYSIS, ROUTINE W REFLEX MICROSCOPIC      Component Value Range   Color, Urine YELLOW  YELLOW    APPearance CLEAR  CLEAR    Specific Gravity, Urine 1.025  1.005 - 1.030    pH 6.0  5.0 - 8.0    Glucose, UA NEGATIVE  NEGATIVE (mg/dL)   Hgb urine dipstick NEGATIVE  NEGATIVE    Bilirubin Urine NEGATIVE  NEGATIVE    Ketones, ur NEGATIVE   NEGATIVE (mg/dL)   Protein, ur NEGATIVE  NEGATIVE (mg/dL)   Urobilinogen, UA 0.2  0.0 - 1.0 (mg/dL)   Nitrite NEGATIVE  NEGATIVE    Leukocytes, UA NEGATIVE  NEGATIVE   PREGNANCY, URINE      Component Value Range   Preg Test, Ur NEGATIVE  NEGATIVE   CBC      Component Value Range   WBC 8.5  4.0 - 10.5 (K/uL)   RBC 4.01  3.87 - 5.11 (MIL/uL)   Hemoglobin 11.6 (*) 12.0 - 15.0 (g/dL)   HCT 16.1 (*) 09.6 - 46.0 (%)   MCV 85.8  78.0 - 100.0 (fL)   MCH 28.9  26.0 - 34.0 (pg)   MCHC 33.7  30.0 - 36.0 (g/dL)   RDW 04.5  40.9 - 81.1 (%)   Platelets 284  150 - 400 (K/uL)  DIFFERENTIAL      Component Value Range   Neutrophils Relative 40 (*) 43 - 77 (%)   Neutro Abs 3.4  1.7 - 7.7 (K/uL)   Lymphocytes Relative 50 (*) 12 - 46 (%)   Lymphs Abs 4.3 (*) 0.7 - 4.0 (K/uL)   Monocytes Relative 9  3 - 12 (%)   Monocytes Absolute 0.7  0.1 - 1.0 (K/uL)   Eosinophils Relative 1  0 - 5 (%)   Eosinophils Absolute 0.1  0.0 - 0.7 (K/uL)   Basophils Relative 0  0 - 1 (%)   Basophils Absolute 0.0  0.0 - 0.1 (K/uL)  COMPREHENSIVE METABOLIC PANEL      Component Value Range   Sodium 142  135 - 145 (mEq/L)   Potassium 3.6  3.5 - 5.1 (mEq/L)   Chloride 107  96 - 112 (mEq/L)   CO2 25  19 - 32 (mEq/L)   Glucose, Bld 115 (*) 70 - 99 (mg/dL)   BUN 7  6 - 23 (mg/dL)   Creatinine, Ser 9.14  0.50 - 1.10 (mg/dL)   Calcium 9.4  8.4 - 78.2 (mg/dL)   Total Protein 7.2  6.0 - 8.3 (g/dL)   Albumin 3.6  3.5 - 5.2 (g/dL)   AST 15  0 - 37 (U/L)   ALT 9  0 - 35 (U/L)   Alkaline Phosphatase 94  39 - 117 (U/L)   Total Bilirubin 0.1 (*) 0.3 - 1.2 (mg/dL)   GFR calc non Af Amer >90  >90 (mL/min)   GFR calc Af Amer >90  >90 (mL/min)  LIPASE, BLOOD      Component Value Range   Lipase 30  11 - 59 (U/L)   COORDINATION OF CARE:    11:13PM- EDP at bedside discusses treatment plan and concern for the gallbladder.0025 Ultrasound is scheduled for  tomorrow at 1:00 PM. To r/o gallbladder disease.  MDM  Patient with  recurrent RUQ abdominal pain that is now more persistent. Given IVF, analgesics, and antiemetic with improvement.Pt stable in ED with no significant deterioration in condition.The patient appears reasonably screened and/or stabilized for discharge and I doubt any other medical condition or other [facility] requiring further screening, evaluation, or treatment in the ED at this time prior to discharge. Patient scheduled for [place] tomorrow. Referral to surgeon.  I personally performed the services described in this documentation, which was scribed in my presence. The recorded information has been reviewed and considered.   MDM Reviewed: nursing note and vitals Interpretation: labs           Nicoletta Dress. Colon Branch, MD 12/06/11 1610  Ultrasound report has been obtained and there is a large gallstone present with a borderline thickened gallbladder wall. Patient is advised of this report. She's very been given referral to a surgeon and is encouraged to follow through with that.  *RADIOLOGY REPORT*  Clinical Data: Right quadrant abdominal pain.  LIMITED ABDOMINAL ULTRASOUND - RIGHT UPPER QUADRANT 12/06/2011:  Comparison: CT abdomen and pelvis 10/24/2011 and 09/23/2011 [facility].  Findings:  Gallbladder: Solitary large (approximate 2.5 cm) gallstone within  the gallbladder fundus. Mild gallbladder wall thickening at 4 mm.  No pericholecystic fluid. Negative sonographic Murphy's sign  according to the ultrasound technologist.  Common bile duct: Normal in caliber with maximum diameter  approximating 6 mm.  Liver: Normal size and echotexture without focal parenchymal  abnormality. Patent portal vein with hepatopetal flow.  IMPRESSION:  1. Cholelithiasis, with a solitary approximate 2.5 cm gallstone in  the gallbladder fundus. Mild gallbladder wall thickening without  sonographic Murphy's sign.  2. No biliary ductal dilation.  3. Normal appearing liver by ultrasound.   Dione Booze,  MD 12/06/11 1415

## 2011-12-06 ENCOUNTER — Other Ambulatory Visit (HOSPITAL_COMMUNITY): Payer: Self-pay

## 2011-12-06 ENCOUNTER — Other Ambulatory Visit (HOSPITAL_COMMUNITY): Payer: Self-pay | Admitting: Emergency Medicine

## 2011-12-06 ENCOUNTER — Ambulatory Visit (HOSPITAL_COMMUNITY)
Admission: RE | Admit: 2011-12-06 | Discharge: 2011-12-06 | Disposition: A | Discharge status: Home / Self Care | Payer: Self-pay | Source: Ambulatory Visit | Attending: Emergency Medicine | Admitting: Emergency Medicine

## 2011-12-06 DIAGNOSIS — R109 Unspecified abdominal pain: Secondary | ICD-10-CM

## 2011-12-06 DIAGNOSIS — R1011 Right upper quadrant pain: Secondary | ICD-10-CM | POA: Insufficient documentation

## 2011-12-06 DIAGNOSIS — K8020 Calculus of gallbladder without cholecystitis without obstruction: Secondary | ICD-10-CM | POA: Insufficient documentation

## 2011-12-06 MED ORDER — PROMETHAZINE HCL 25 MG PO TABS: 12.5000 mg | ORAL_TABLET | Freq: Four times a day (QID) | ORAL | Status: DC | PRN

## 2011-12-06 MED ORDER — HYDROCODONE-ACETAMINOPHEN 5-325 MG PO TABS: 1.0000 | ORAL_TABLET | ORAL | Status: AC | PRN

## 2011-12-06 NOTE — ED Notes (Signed)
Scheduled for ultrasound 12/06/11. Appt time 13:00 - instruct patient to be here at 12:45  Light breakfast then nothing after 06:00am  And no milk, dairy or meat products in the light breakfast.

## 2011-12-06 NOTE — ED Notes (Signed)
Up to bathroom again

## 2011-12-06 NOTE — ED Notes (Signed)
Patient given Ginger Ale per request.  Patient rates pain as 7/10 now.

## 2012-02-13 ENCOUNTER — Encounter (HOSPITAL_COMMUNITY): Payer: Self-pay | Admitting: *Deleted

## 2012-02-13 DIAGNOSIS — I10 Essential (primary) hypertension: Secondary | ICD-10-CM | POA: Insufficient documentation

## 2012-02-13 DIAGNOSIS — F17200 Nicotine dependence, unspecified, uncomplicated: Secondary | ICD-10-CM | POA: Insufficient documentation

## 2012-02-13 DIAGNOSIS — Z331 Pregnant state, incidental: Secondary | ICD-10-CM | POA: Insufficient documentation

## 2012-02-13 DIAGNOSIS — R51 Headache: Secondary | ICD-10-CM | POA: Insufficient documentation

## 2012-02-13 DIAGNOSIS — Z6841 Body Mass Index (BMI) 40.0 and over, adult: Secondary | ICD-10-CM | POA: Insufficient documentation

## 2012-02-13 NOTE — ED Notes (Signed)
Headache, nausea.  [redacted] weeks pregnant.

## 2012-02-14 ENCOUNTER — Emergency Department (HOSPITAL_COMMUNITY)
Admission: EM | Admit: 2012-02-14 | Discharge: 2012-02-14 | Disposition: A | Discharge status: Home / Self Care | Payer: Medicaid Other | Attending: Emergency Medicine | Admitting: Emergency Medicine

## 2012-02-14 DIAGNOSIS — Z331 Pregnant state, incidental: Secondary | ICD-10-CM

## 2012-02-14 DIAGNOSIS — I10 Essential (primary) hypertension: Secondary | ICD-10-CM

## 2012-02-14 DIAGNOSIS — R51 Headache: Secondary | ICD-10-CM

## 2012-02-14 LAB — COMPREHENSIVE METABOLIC PANEL
ALT: 12 U/L (ref 0–35)
AST: 17 U/L (ref 0–37)
Alkaline Phosphatase: 92 U/L (ref 39–117)
CO2: 23 mEq/L (ref 19–32)
Calcium: 10 mg/dL (ref 8.4–10.5)
GFR calc Af Amer: >90 mL/min (ref >90)
GFR calc non Af Amer: >90 mL/min (ref >90)
Glucose, Bld: 95 mg/dL (ref 70–99)
Potassium: 3.6 mEq/L (ref 3.5–5.1)
Sodium: 135 mEq/L (ref 135–145)
Total Protein: 7.7 g/dL (ref 6.0–8.3)

## 2012-02-14 LAB — CBC
Platelets: 269 10*3/uL (ref 150–400)
RBC: 4.23 MIL/uL (ref 3.87–5.11)
RDW: 13.7 % (ref 11.5–15.5)
WBC: 9.6 10*3/uL (ref 4.0–10.5)

## 2012-02-14 LAB — DIFFERENTIAL
Basophils Absolute: 0 10*3/uL (ref 0.0–0.1)
Lymphocytes Relative: 36 % (ref 12–46)
Lymphs Abs: 3.5 10*3/uL (ref 0.7–4.0)
Neutrophils Relative %: 53 % (ref 43–77)

## 2012-02-14 LAB — URINALYSIS, ROUTINE W REFLEX MICROSCOPIC
Bilirubin Urine: NEGATIVE
Hgb urine dipstick: NEGATIVE
Nitrite: NEGATIVE
Specific Gravity, Urine: >1.03 — ABNORMAL HIGH (ref 1.005–1.030)
pH: 6 (ref 5.0–8.0)

## 2012-02-14 MED ORDER — HYDROCODONE-ACETAMINOPHEN 5-325 MG PO TABS: 1.0000 | ORAL_TABLET | ORAL | Status: AC | PRN

## 2012-02-14 MED ORDER — ACETAMINOPHEN 325 MG PO TABS
650.0000 mg | ORAL_TABLET | Freq: Once | ORAL | Status: AC
  Administered 2012-02-14: 650 mg via ORAL
  Filled 2012-02-14: qty 2

## 2012-02-14 NOTE — ED Provider Notes (Signed)
History     CSN: [id number]  Arrival date & time 02/13/12  2122   First MD Initiated Contact with Patient 02/14/12 0145      Chief Complaint  Patient presents with  . Headache    (Consider location/radiation/quality/duration/timing/severity/associated sxs/prior treatment) HPI Comments: [name] is a 26 y.o. Female presents with 2 days of headache that waxes and wanes. It did not improve with a singleTylenol. She states that she is [redacted] weeks pregnant; based on a last menstrual period of 12/16/11. She is G-0. Her first prenatal visit is in 3 days. She has had some nausea and vomiting, which she attributes to morning sickness. She denies fever, change in bowel or urinary habits, cough, shortness of breath, or chest pain.   Patient is a 26 y.o. female presenting with headaches. The history is provided by the patient.  Headache     Past Medical History  Diagnosis Date  . Hypertension   . Polycystic ovarian syndrome   . Bronchitis   . Abnormal vaginal bleeding   . Sleep apnea   . Renal disorder     kidney stone    Past Surgical History  Procedure Date  . Tonsillectomy     History reviewed. No pertinent family history.  History  Substance Use Topics  . Smoking status: Current Everyday Smoker    Types: Cigarettes  . Smokeless tobacco: Not on file  . Alcohol Use: Yes     occasional    OB History    Grav Para Term Preterm Abortions TAB SAB Ect Mult Living   1               Review of Systems  Neurological: Positive for headaches.  All other systems reviewed and are negative.    Allergies  Review of patient's allergies indicates no known allergies.  Home Medications   Current Outpatient Rx  Name Route Sig Dispense Refill  . HYDROCODONE-ACETAMINOPHEN 5-325 MG PO TABS Oral Take 1 tablet by mouth every 4 (four) hours as needed for pain. 10 tablet 0  . IBUPROFEN 200 MG PO TABS Oral Take 400 mg by mouth every 8 (eight) hours as needed. Headaches/pain     .  LISINOPRIL-HYDROCHLOROTHIAZIDE 20-25 MG PO TABS Oral Take 1 tablet by mouth daily.        BP 132/76  Pulse 70  Temp(Src) 98.8 F (37.1 C) (Oral)  Resp 14  Ht 5\' 7"  (1.702 m)  Wt 285 lb 14.4 oz (129.683 kg)  BMI 44.78 kg/m2  SpO2 100%  LMP 12/12/2011  Physical Exam  Nursing note and vitals reviewed. Constitutional: She is oriented to person, place, and time. She appears well-developed and well-nourished.       She Is obese  HENT:  Head: Normocephalic and atraumatic.  Eyes: Conjunctivae and EOM are normal. Pupils are equal, round, and reactive to light.  Neck: Normal range of motion and phonation normal. Neck supple.       No meningismus  Cardiovascular: Normal rate, regular rhythm and intact distal pulses.   Pulmonary/Chest: Effort normal and breath sounds normal. She exhibits no tenderness.  Abdominal: Soft. She exhibits no distension and no mass. There is no tenderness. There is no guarding.  Musculoskeletal: Normal range of motion.  Neurological: She is alert and oriented to person, place, and time. She has normal strength. She exhibits normal muscle tone.  Skin: Skin is warm and dry.  Psychiatric: She has a normal mood and affect. Her behavior is normal. Judgment and  thought content normal.    ED Course  Procedures (including critical care time) This case was discussed with her gynecologist, who will see her. Later today if needed or as scheduled in 2 days      Labs Reviewed  CBC - Abnormal; Notable for the following:    Hemoglobin 11.9 (*)    All other components within normal limits  COMPREHENSIVE METABOLIC PANEL - Abnormal; Notable for the following:    Total Bilirubin 0.2 (*)    All other components within normal limits  URINALYSIS, ROUTINE W REFLEX MICROSCOPIC - Abnormal; Notable for the following:    APPearance HAZY (*)    Specific Gravity, Urine >1.030 (*)    Ketones, ur TRACE (*)    Protein, ur TRACE (*)    All other components within normal limits    DIFFERENTIAL  URINE MICROSCOPIC-ADD ON   No results found.   1. Headache   2. Hypertension   3. Pregnancy as incidental finding       MDM  Hypertension, mild, in first trimester pregnancy. Doubt preeclampsia, hypertensive crisis or occult infection.   Plan: Home Medications- Norco; Home Treatments- rest. ; Recommended follow up- OB in 2 days as scheduled        Flint Melter, MD 02/14/12 [phone]

## 2012-02-14 NOTE — Discharge Instructions (Signed)
See Dr. Emelda Fear on Thursday, as scheduled. You can also call him today if needed for problems.   General Headache, Without Cause A general headache has no specific cause. These headaches are not life-threatening. They will not lead to other types of headaches. HOME CARE   Make and keep follow-up visits with your doctor.   Only take medicine as told by your doctor.   Try to relax, get a massage, or use your thoughts to control your body (biofeedback).   Apply cold or heat to the head and neck. Apply 3 or 4 times a day or as needed.  Finding out the results of your test Ask when your test results will be ready. Make sure you get your test results. GET HELP RIGHT AWAY IF:   You have problems with medicine.   Your medicine does not help relieve pain.   Your headache changes or becomes worse.   You feel sick to your stomach (nauseous) or throw up (vomit).   You have a temperature by mouth above 102 F (38.9 C), not controlled by medicine.   Your have a stiff neck.   You have vision loss.   You have muscle weakness.   You lose control of your muscles.   You lose balance or have trouble walking.   You feel like you are going to pass out (faint).  MAKE SURE YOU:   Understand these instructions.   Will watch this condition.   Will get help right away if you are not doing well or get worse.  Document Released: 08/02/2008 Document Revised: 10/13/2011 Document Reviewed: 08/02/2008 [facility] Patient Information 2012 [place], [place] a Baby Grows During Pregnancy Pregnancy begins when the female's sperm enters the female's egg. This happens in the fallopian tube and is called fertilization. The fertilized egg is called an embryo until it reaches 9 weeks from the time of fertilization. From 9 weeks until birth it is called a fetus. The fertilized egg moves down the tube into the uterus and attaches to the inside lining of the uterus.  The pregnant woman is responsible for the  growth of the embryo/fetus by supplying nourishment and oxygen through the blood stream and placenta to the developing fetus. The uterus becomes larger and pops out from the abdomen more and more as the fetus develops and grows. A normal pregnancy lasts 280 days, with a range of 259 to 294 days, or 40 weeks. The pregnancy is divided up into three trimesters:  First trimester - 0 to 13 weeks.   Second trimester - 14 to 27 weeks.   Third trimester - 28 to 40 weeks.  The day your baby is supposed to be born is called estimated date of confinement [facility]) or estimated date of delivery (EDD). GROWTH OF THE BABY MONTH BY MONTH 1. First Month: The fertilized egg attaches to the inside of the uterus and certain cells will form the placenta and others will develop into the fetus. The arms, legs, brain, spinal cord, lungs, and heart begin to develop. At the end of the first month the heart begins to beat. The embryo weighs less than an ounce and is  inch long.  2. Second Month: The bones can be seen, the inner ear, eye lids, hands and feet form and genitals develop. By the end of 8 weeks, all of the major organs are developing. The fetus now weighs less than an ounce and is one inch (2.54 cm) long.  3. Third Month: Teeth buds appear, all  the internal organs are forming, bones and muscles begin to grow, the spine can flex and the skin is transparent. Finger and toe nails begin to form, the hands develop faster than the feet and the arms are longer than the legs at this point. The fetus weighs a little more than an ounce (0.03 kg) and is 3 inches (8.89cm) long.  4. Fourth Month: The placenta is completely formed. The external sex organs, neck, outer ear, eyebrows, eyelids and fingernails are formed. The fetus can hear, swallow, flex its arms and legs and the kidney begins to produce urine. The skin is covered with a white waxy coating (vernix) and very thin hair (lanugo) is present. The fetus weighs 5 ounces  (0.14kg) and is 6 to 7 inches (16.51cm) long.  5. Fifth Month: The fetus moves around more and can be felt for the first time (called quickening), sleeps and wakes up at times, may begin to suck its finger and the nails grow to the end of the fingers. The gallbladder is now functioning and helps to digest the nutrients, eggs are formed in the female and the testicles begin to drop down from the abdomen to the scrotum in the female. The fetus weighs  to 1 pound (0.45kg) and is 10 inches (25.4cm) long.  6. Sixth Month: The lungs are formed but the fetus does not breath yet. The eyes open, the brain develops more quickly at this time, one can detect finger and toe prints and thicker hair grows. The fetus weighs 1 to 1 pounds (0.68kg) and is 12 inches (30.48cm) long.  7. Seventh Month: The fetus can hear and respond to sounds, kicks and stretches and can sense changes in light. The fetus weighs 2 to 2 pounds (1.13kg) and is 14 inches (35.56cm) long.  8. Eight Month: All organs and body systems are fully developed and functioning. The bones get harder, taste buds develop and can taste sweet and sour flavors and the fetus may hiccup now. Different parts of the brain are developing and the skull remains soft for the brain to grow. The fetus weighs 5 pounds (2.27kg) and is 18 inches (45.75cm) long.  9. Ninth Month: The fetus gains about a half a pound a week, the lungs are fully developed, patterns of sleep develop and the head moves down into the bottom of the uterus called vertex. If the buttocks moves into the bottom of the uterus, it is called a breech. The fetus weighs 6 to 9 pounds (2.72 to 4.08kg) and is 20 inches (50.8cm) long.  You should be informed about your pregnancy, yourself and how the baby is developing as much as possible. Being informed helps you to enjoy this experience. It also gives you the sense to feel if something is not going right and when to ask questions. Talk to your caregiver when you  have questions about your baby or your own body. Document Released: 04/11/2008 Document Revised: 10/13/2011 Document Reviewed: 04/11/2008 [facility] Patient Information 2012 [place], [place].Hypertension Hypertension is another name for high blood pressure. High blood pressure may mean that your heart needs to work harder to pump blood. Blood pressure consists of two numbers, which includes a higher number over a lower number (example: 110/72). HOME CARE   Make lifestyle changes as told by your doctor. This may include weight loss and exercise.   Take your blood pressure medicine every day.   Limit how much salt you use.   Stop smoking if you smoke.   Do  not use drugs.   Talk to your doctor if you are using decongestants or birth control pills. These medicines might make blood pressure higher.   Females should not drink more than 1 alcoholic drink per day. Males should not drink more than 2 alcoholic drinks per day.   See your doctor as told.  GET HELP RIGHT AWAY IF:   You have a blood pressure reading with a top number of 180 or higher.   You get a very bad headache.   You get blurred or changing vision.   You feel confused.   You feel weak, numb, or faint.   You get chest or belly (abdominal) pain.   You throw up (vomit).   You cannot breathe very well.  MAKE SURE YOU:   Understand these instructions.   Will watch your condition.   Will get help right away if you are not doing well or get worse.  Document Released: 04/11/2008 Document Revised: 10/13/2011 Document Reviewed: 04/11/2008 [facility] Patient Information 2012 [place], [place].Pregnancy If you are planning on getting pregnant, it is a good idea to make a preconception appointment with your care- giver to discuss having a healthy lifestyle before getting pregnant. Such as, diet, weight, exercise, taking prenatal vitamins especially folic acid (it helps prevent brain and spinal cord defects), avoiding alcohol,  smoking and illegal drugs, medical problems (diabetes, convulsions), family history of genetic problems, working conditions and immunizations. It is better to have knowledge of these things and do something about them before getting pregnant. In your pregnancy, it is important to follow certain guidelines to have a healthy baby. It is very important to get good prenatal care and follow your caregiver's instructions. Prenatal care includes all the medical care you receive before your baby's birth. This helps to prevent problems during the pregnancy and childbirth. HOME CARE INSTRUCTIONS   Start your prenatal visits by the 12th week of pregnancy or before when possible. They are usually scheduled monthly at first. They are more often in the last 2 months before delivery. It is important that you keep your caregiver's appointments and follow your caregiver's instructions regarding medication use, exercise, and diet.   During pregnancy, you are providing food for you and your baby. Eat a regular, well-balanced diet. Choose foods such as meat, fish, milk and other dairy products, vegetables, fruits, whole-grain breads and cereals. Your caregiver will inform you of the ideal weight gain depending on your current height and weight. Drink lots of liquids. Try to drink 8 glasses of water a day.   Alcohol is associated with a number of birth defects including fetal alcohol syndrome. It is best to avoid alcohol completely. Smoking will cause low birth rate and prematurity. Use of alcohol and nicotine during your pregnancy also increases the chances that your child will be chemically dependent later in their life and may contribute to SIDS (Sudden Infant Death Syndrome).   Do not use illegal drugs.   Only take prescription or over-the-counter medications that are recommended by your caregiver. Other medications can cause genetic and physical problems in the baby.   Morning sickness can often be helped by keeping  soda crackers at the bedside. Eat a couple before arising in the morning.   A sexual relationship may be continued until near the end of pregnancy if there are no other problems such as early (premature) leaking of amniotic fluid from the membranes, vaginal bleeding, painful intercourse or belly (abdominal) pain.   Exercise regularly. Check with your  caregiver if you are unsure of the safety of some of your exercises.   Do not use hot tubs, steam rooms or saunas. These increase the risk of fainting or passing out and hurting yourself and the baby. Swimming is OK for exercise. Get plenty of rest, including afternoon naps when possible especially in the third trimester.   Avoid toxic odors and chemicals.   Do not wear high heels. They may cause you to lose your balance and fall.   Do not lift over 5 pounds. If you do lift anything, lift with your legs and thighs, not your back.   Avoid long trips, especially in the third trimester.   If you have to travel out of the city or state, take a copy of your medical records with you.  SEEK IMMEDIATE MEDICAL CARE IF:   You develop an unexplained oral temperature above 102 F (38.9 C), or as your caregiver suggests.   You have leaking of fluid from the vagina. If leaking membranes are suspected, take your temperature and inform your caregiver of this when you call.   There is vaginal spotting or bleeding. Notify your caregiver of the amount and how many pads are used.   You continue to feel sick to your stomach (nauseous) and have no relief from remedies suggested, or you throw up (vomit) blood or coffee ground like materials.   You develop upper abdominal pain.   You have round ligament discomfort in the lower abdominal area. This still must be evaluated by your caregiver.   You feel contractions of the uterus.   You do not feel the baby move, or there is less movement than before.   You have painful urination.   You have abnormal vaginal  discharge.   You have persistent diarrhea.   You get a severe headache.   You have problems with your vision.   You develop muscle weakness.   You feel dizzy and faint.   You develop shortness of breath.   You develop chest pain.   You have back pain that travels down to your leg and feet.   You feel irregular or a very fast heartbeat.   You develop excessive weight gain in a short period of time (5 pounds in 3 to 5 days).   You are involved with a domestic violence situation.  Document Released: 10/24/2005 Document Revised: 10/13/2011 Document Reviewed: 04/17/2009 [facility] Patient Information 2012 [place], [place].Marland Kitchen

## 2012-02-16 ENCOUNTER — Other Ambulatory Visit (HOSPITAL_COMMUNITY)
Admission: RE | Admit: 2012-02-16 | Discharge: 2012-02-16 | Disposition: A | Discharge status: Home / Self Care | Payer: Medicaid Other | Source: Ambulatory Visit | Attending: Obstetrics and Gynecology | Admitting: Obstetrics and Gynecology

## 2012-02-16 ENCOUNTER — Other Ambulatory Visit: Payer: Self-pay | Admitting: Adult Health

## 2012-02-16 DIAGNOSIS — Z113 Encounter for screening for infections with a predominantly sexual mode of transmission: Secondary | ICD-10-CM | POA: Insufficient documentation

## 2012-02-16 DIAGNOSIS — Z01419 Encounter for gynecological examination (general) (routine) without abnormal findings: Secondary | ICD-10-CM | POA: Insufficient documentation

## 2012-02-16 LAB — OB RESULTS CONSOLE ABO/RH: RH Type: POSITIVE

## 2012-02-16 LAB — OB RESULTS CONSOLE RPR: RPR: NONREACTIVE

## 2012-02-16 LAB — OB RESULTS CONSOLE GC/CHLAMYDIA
Chlamydia: NEGATIVE
Gonorrhea: NEGATIVE

## 2012-02-16 LAB — OB RESULTS CONSOLE HEPATITIS B SURFACE ANTIGEN: Hepatitis B Surface Ag: NEGATIVE

## 2012-04-07 ENCOUNTER — Emergency Department (HOSPITAL_COMMUNITY)
Admission: EM | Admit: 2012-04-07 | Discharge: 2012-04-07 | Disposition: A | Discharge status: Home / Self Care | Payer: Medicaid Other | Attending: Emergency Medicine | Admitting: Emergency Medicine

## 2012-04-07 ENCOUNTER — Encounter (HOSPITAL_COMMUNITY): Payer: Self-pay | Admitting: Emergency Medicine

## 2012-04-07 DIAGNOSIS — O2390 Unspecified genitourinary tract infection in pregnancy, unspecified trimester: Secondary | ICD-10-CM | POA: Insufficient documentation

## 2012-04-07 DIAGNOSIS — Z3490 Encounter for supervision of normal pregnancy, unspecified, unspecified trimester: Secondary | ICD-10-CM

## 2012-04-07 DIAGNOSIS — I10 Essential (primary) hypertension: Secondary | ICD-10-CM | POA: Insufficient documentation

## 2012-04-07 DIAGNOSIS — A499 Bacterial infection, unspecified: Secondary | ICD-10-CM | POA: Insufficient documentation

## 2012-04-07 DIAGNOSIS — G4730 Sleep apnea, unspecified: Secondary | ICD-10-CM | POA: Insufficient documentation

## 2012-04-07 DIAGNOSIS — R109 Unspecified abdominal pain: Secondary | ICD-10-CM | POA: Insufficient documentation

## 2012-04-07 DIAGNOSIS — N760 Acute vaginitis: Secondary | ICD-10-CM | POA: Insufficient documentation

## 2012-04-07 DIAGNOSIS — B9689 Other specified bacterial agents as the cause of diseases classified elsewhere: Secondary | ICD-10-CM | POA: Insufficient documentation

## 2012-04-07 LAB — URINALYSIS, ROUTINE W REFLEX MICROSCOPIC
Bilirubin Urine: NEGATIVE
Glucose, UA: NEGATIVE mg/dL
Hgb urine dipstick: NEGATIVE
Specific Gravity, Urine: 1.025 (ref 1.005–1.030)

## 2012-04-07 LAB — WET PREP, GENITAL
Trich, Wet Prep: NONE SEEN
Yeast Wet Prep HPF POC: NONE SEEN

## 2012-04-07 MED ORDER — METRONIDAZOLE 500 MG PO TABS: 500.0000 mg | ORAL_TABLET | Freq: Two times a day (BID) | ORAL | Status: AC

## 2012-04-07 NOTE — ED Notes (Signed)
Pt stated "I am having a lot of burning when I pee and my lower back is aching".

## 2012-04-07 NOTE — ED Notes (Signed)
Discharge instructions reviewed with pt, questions answered. Pt verbalized understanding.  

## 2012-04-07 NOTE — ED Provider Notes (Signed)
History  This chart was scribed for Carleene Cooper III, MD by Stevphen Meuse. This patient was seen in room APA01/APA01 and the patient's care was started at 1:09PM.   CSN: [id number]  Arrival date & time 04/07/12  1155   First MD Initiated Contact with Patient 04/07/12 1305      Chief Complaint  Patient presents with  . Abdominal Pain  . Dysuria    (Consider location/radiation/quality/duration/timing/severity/associated sxs/prior treatment) Patient is a 26 y.o. female presenting with abdominal pain and dysuria. The history is provided by the patient. No language interpreter was used.  Abdominal Pain The primary symptoms of the illness include abdominal pain, dysuria (burning sensation ) and vaginal discharge (clear). The primary symptoms of the illness do not include fever, shortness of breath or diarrhea.  The dysuria is not associated with hematuria.  The vaginal discharge is associated with dysuria (burning sensation ).  Symptoms associated with the illness do not include hematuria or back pain.  Dysuria  Pertinent negatives include no hematuria.   [name] is a 26 y.o. female who presents to the Emergency Department complaining of 4 days of  gradual onset, gradually worsening lower abdominal pain. Pt is currently 4 months pregnant. She states that she was trying to wait until her doctors appointment next week but the pain worsened. Pt states that she only takes phenergan and prenatal vitamins but denies taking any medication for her current symptoms. Pt states that she had a gallstone before she got pregnant that was not removed. Pt denies being around anyone sick. Pt states having a episode of hematuria last month. Pt denies any modifying factors. Pt denies fever, sore throat, eye pain, SOB, chest pain, hematuria, arthralgia, HA, adenopathy and confusion as associated symptoms. She says that she had an ultrasound showing intrauterine pregnancy by her OBGYN in April 2013.  Pt  has h/o renal disorder, abnormal vaginal bleeding, HTN, polycystic ovarian syndrome and kidney infection. Pt is a former smoker and has a h/o occasional alcohol use.   Pt's OBGYN: Dr. Emelda Fear   Past Medical History  Diagnosis Date  . Hypertension   . Polycystic ovarian syndrome   . Bronchitis   . Abnormal vaginal bleeding   . Sleep apnea   . Renal disorder     kidney stone    Past Surgical History  Procedure Date  . Tonsillectomy     History reviewed. No pertinent family history.  History  Substance Use Topics  . Smoking status: Former Smoker    Types: Cigarettes  . Smokeless tobacco: Not on file  . Alcohol Use: No     occasional    OB History    Grav Para Term Preterm Abortions TAB SAB Ect Mult Living   1               Review of Systems  Constitutional: Negative for fever.  HENT: Negative for neck pain.   Eyes: Negative for pain.  Respiratory: Negative for shortness of breath.   Gastrointestinal: Positive for abdominal pain. Negative for diarrhea.  Genitourinary: Positive for dysuria (burning sensation ) and vaginal discharge (clear). Negative for hematuria.  Musculoskeletal: Negative for back pain.  Skin: Negative for rash.  Neurological: Negative for headaches.  Hematological: Negative for adenopathy.  Psychiatric/Behavioral: Negative for agitation.  All other systems reviewed and are negative.    Allergies  Review of patient's allergies indicates no known allergies.  Home Medications   Current Outpatient Rx  Name Route Sig Dispense Refill  .  PRENATAL PLUS 27-1 MG PO TABS Oral Take 1 tablet by mouth daily.    Marland Kitchen PROMETHAZINE HCL 25 MG PO TABS Oral Take 25 mg by mouth every 6 (six) hours as needed. For nausea    . PROMETHAZINE HCL 25 MG PO TABS Oral Take 1 tablet (25 mg total) by mouth every 6 (six) hours as needed for nausea. 12 tablet 0  . PROMETHAZINE HCL 25 MG PO TABS Oral Take 0.5 tablets (12.5 mg total) by mouth every 6 (six) hours as needed for  nausea. 10 tablet 0    Triage Vitals: BP 125/81  Pulse 86  Temp(Src) 98.3 F (36.8 C) (Oral)  Resp 18  Ht 5\' 7"  (1.702 m)  Wt 278 lb (126.1 kg)  BMI 43.54 kg/m2  SpO2 100%  LMP 12/12/2011  Physical Exam  Nursing note and vitals reviewed. Constitutional: She is oriented to person, place, and time. She appears well-developed and well-nourished.  HENT:  Head: Normocephalic and atraumatic.  Left Ear: External ear normal.  Nose: Nose normal.  Mouth/Throat: Oropharynx is clear and moist.  Eyes: Conjunctivae and EOM are normal. Pupils are equal, round, and reactive to light.  Neck: Normal range of motion. Neck supple.  Cardiovascular: Normal rate, regular rhythm and normal heart sounds.   Pulmonary/Chest: Effort normal and breath sounds normal.  Abdominal: Soft. Bowel sounds are normal.  Genitourinary: Guaiac stool: Pelvic exam showed normal external female genitalia. She has a thick white discharge. Uterus and adnexa could not be palpated because of patient's body habitus, but there was no pelvic tenderness to bimanual palpation. Rectal exam was not performed.       Pelvic exam showed normal external female genitalia. She has a thick white discharge. Bimanual exam showed no uterine or adnexal tenderness, but he patient's uterus and adnexa he could not be well distinguished due to her body habitus.  Musculoskeletal: Normal range of motion. She exhibits tenderness (Pt localizes her pain to the super pubic region. ).  Neurological: She is alert and oriented to person, place, and time.  Skin: Skin is warm and dry.    ED Course  Procedures (including critical care time) DIAGNOSTIC STUDIES: Oxygen Saturation is 100% on room air, normal by my interpretation.    COORDINATION OF CARE:  1:14PM Discussed ordering a pelvic exam with pt and pt agreed  2:45 PM Prolonged wait for wet prep result because specimen had not been taken to the lab.  3:01 PM Wet prep showed clue cells.  Rx for  bacterial vaginosis with metronidazole 500 mg bid x 7 days.  1. Bacterial vaginosis   2. Intrauterine pregnancy       Carleene Cooper III, MD 04/07/12 1504

## 2012-04-07 NOTE — Discharge Instructions (Signed)
The test of the vaginal discharge shows you have an infection called bacterial vaginosis. Take the antibiotic metronidazole 500 mg twice a day for 7 days. Take this followup sheet your next appointment with Dr. Emelda Fear he can be aware of this visit and your treatment.Bacterial Vaginosis Bacterial vaginosis (BV) is a vaginal infection where the normal balance of bacteria in the vagina is disrupted. The normal balance is then replaced by an overgrowth of certain bacteria. There are several different kinds of bacteria that can cause BV. BV is the most common vaginal infection in women of childbearing age. CAUSES   The cause of BV is not fully understood. BV develops when there is an increase or imbalance of harmful bacteria.   Some activities or behaviors can upset the normal balance of bacteria in the vagina and put women at increased risk including:   Having a new sex partner or multiple sex partners.   Douching.   Using an intrauterine device (IUD) for contraception.   It is not clear what role sexual activity plays in the development of BV. However, women that have never had sexual intercourse are rarely infected with BV.  Women do not get BV from toilet seats, bedding, swimming pools or from touching objects around them.  SYMPTOMS   Grey vaginal discharge.   A fish-like odor with discharge, especially after sexual intercourse.   Itching or burning of the vagina and vulva.   Burning or pain with urination.   Some women have no signs or symptoms at all.  DIAGNOSIS  Your caregiver must examine the vagina for signs of BV. Your caregiver will perform lab tests and look at the sample of vaginal fluid through a microscope. They will look for bacteria and abnormal cells (clue cells), a pH test higher than 4.5, and a positive amine test all associated with BV.  RISKS AND COMPLICATIONS   Pelvic inflammatory disease (PID).   Infections following gynecology surgery.   Developing HIV.    Developing herpes virus.  TREATMENT  Sometimes BV will clear up without treatment. However, all women with symptoms of BV should be treated to avoid complications, especially if gynecology surgery is planned. Female partners generally do not need to be treated. However, BV may spread between female sex partners so treatment is helpful in preventing a recurrence of BV.   BV may be treated with antibiotics. The antibiotics come in either pill or vaginal cream forms. Either can be used with nonpregnant or pregnant women, but the recommended dosages differ. These antibiotics are not harmful to the baby.   BV can recur after treatment. If this happens, a second round of antibiotics will often be prescribed.   Treatment is important for pregnant women. If not treated, BV can cause a premature delivery, especially for a pregnant woman who had a premature birth in the past. All pregnant women who have symptoms of BV should be checked and treated.   For chronic reoccurrence of BV, treatment with a type of prescribed gel vaginally twice a week is helpful.  HOME CARE INSTRUCTIONS   Finish all medication as directed by your caregiver.   Do not have sex until treatment is completed.   Tell your sexual partner that you have a vaginal infection. They should see their caregiver and be treated if they have problems, such as a mild rash or itching.   Practice safe sex. Use condoms. Only have 1 sex partner.  PREVENTION  Basic prevention steps can help reduce the risk of  upsetting the natural balance of bacteria in the vagina and developing BV:  Do not have sexual intercourse (be abstinent).   Do not douche.   Use all of the medicine prescribed for treatment of BV, even if the signs and symptoms go away.   Tell your sex partner if you have BV. That way, they can be treated, if needed, to prevent reoccurrence.  SEEK MEDICAL CARE IF:   Your symptoms are not improving after 3 days of treatment.   You  have increased discharge, pain, or fever.  MAKE SURE YOU:   Understand these instructions.   Will watch your condition.   Will get help right away if you are not doing well or get worse.  FOR MORE INFORMATION  Division of STD Prevention (DSTDP), Centers for Disease Control and Prevention: [employer] American [employer] Association (ASHA): www.ashastd.org  Document Released: 10/24/2005 Document Revised: 10/13/2011 Document Reviewed: 04/16/2009 [facility] Patient Information 2012 [place], [place].

## 2012-04-07 NOTE — ED Notes (Signed)
Pt c/o lower abd pain and trouble urinating since Tuesday. Pt states she is four months pregnant.

## 2012-04-09 LAB — GC/CHLAMYDIA PROBE AMP, GENITAL
Chlamydia, DNA Probe: NEGATIVE
GC Probe Amp, Genital: NEGATIVE

## 2012-06-03 ENCOUNTER — Ambulatory Visit: Payer: Medicaid Other | Attending: Obstetrics & Gynecology | Admitting: Sleep Medicine

## 2012-06-03 DIAGNOSIS — Z6841 Body Mass Index (BMI) 40.0 and over, adult: Secondary | ICD-10-CM | POA: Insufficient documentation

## 2012-06-03 DIAGNOSIS — G4710 Hypersomnia, unspecified: Secondary | ICD-10-CM | POA: Insufficient documentation

## 2012-06-03 DIAGNOSIS — G4730 Sleep apnea, unspecified: Secondary | ICD-10-CM

## 2012-06-07 NOTE — Procedures (Signed)
[name], [name]            ACCOUNT NO.:  [id number]  MEDICAL RECORD NO.:  [id number]          PATIENT TYPE:  OUT  LOCATION:  SLEEP LAB                     FACILITY:  [facility]  PHYSICIAN:  Labrian Torregrossa A. Gerilyn Pilgrim, M.D. DATE OF BIRTH:  01-Aug-1986  DATE OF STUDY:  06/03/2012                           NOCTURNAL POLYSOMNOGRAM  REFERRING PHYSICIAN:  Lazaro Arms, M.D.  INDICATION:  A 26 year old who presents with hypersomnia and snoring.  MEDICATIONS:  Flexeril, promethazine.  EPWORTH SLEEPINESS SCALE:  17.  BMI:  45.  ARCHITECTURAL SUMMARY:  The total recording time is 427 minutes.  Sleep efficiency 47%.  Sleep latency 147 minutes.  REM latency 60 minutes. Stage N1 9.5%, N2 48% N3 25% and REM sleep 17%.  RESPIRATORY SUMMARY:  Baseline oxygen saturation is 98, lowest saturation 87 during REM sleep.  Diagnostic AHI is 1.2 and RDI also 1.2.  LIMB MOVEMENT SUMMARY:  PLM index 0.  ELECTROCARDIOGRAM SUMMARY:  Average heart rate is 84 with no significant dysrhythmias observed.  IMPRESSION:  Abnormal sleep architecture with poor sleep efficiency. Otherwise, unremarkable nocturnal polysomnography.  RECOMMENDATIONS:  Given the high Epworth Sleepiness Scale of 17 and hypersomnia, consider Sleep consultation for further evaluation.   Tanyah Debruyne A. Gerilyn Pilgrim, M.D.    KAD/MEDQ  D:  06/06/2012 09:55:58  T:  06/07/2012 00:37:36  Job:  [id number]

## 2012-09-17 ENCOUNTER — Other Ambulatory Visit (HOSPITAL_COMMUNITY): Payer: Self-pay | Admitting: Obstetrics and Gynecology

## 2012-09-17 NOTE — H&P (Signed)
  [name] is a 26 y.Angel. female presenting for induction of labor at 39 weeks 6 days for a history of chronic hypertension. This 26 year old female gravida 1 para 0 now at 39 weeks 6 days by earliest ultrasound, at 8 weeks 3 days is admitted for induction of labor. She has a history of chronic hypertension requiring blood pressure medications prior to pregnancy. She has required no medications during this pregnancy with initial pressures in the 122-130/70-78 range. Since 35 weeks she's had gradual increase in pressures with 90s diastolics negative proteinuria, without symptoms of PIH since 11 5. NSTs have been reactive cervix is not yet favorable at 1 cm long -2, soft with vertex well applied, so cervical ripening is planned. She was tested for sleep apnea in August and no atony was documented the patient procedure sounded to have this problem in addition to morbid obesity, kidney stone by history, and reported PCO S. She denies headache scotomata or right upper quadrant pain . History OB History    Grav Para Term Preterm Abortions TAB SAB Ect Mult Living   1              Past Medical History  Diagnosis Date  . Hypertension   . Polycystic ovarian syndrome   . Bronchitis   . Abnormal vaginal bleeding   . Sleep apnea   . Renal disorder     kidney stone   Past Surgical History  Procedure Date  . Tonsillectomy    Family History: family history is not on file. Social History:  reports that she has quit smoking. Her smoking use included Cigarettes. She does not have any smokeless tobacco history on file. She reports that she does not drink alcohol or use illicit drugs.   Prenatal Transfer Tool  Maternal Diabetes: No Genetic Screening: Normal Down syndrome risk less than 1 5000 Maternal Ultrasounds/Referrals: Normal Fetal Ultrasounds or other Referrals:  None Maternal Substance Abuse:  No Significant Maternal Medications:  None Significant Maternal Lab Results:  Lab values  include: Group B Strep negative Other Comments:  None  ROS    Last menstrual period 12/12/2011. Exam Physical Exam weight 285 pounds, 5 pound weight gain this pregnancy, blood pressure 140/90 Physical Examination: General appearance - alert, well appearing, and in no distress, oriented to person, place, and time and overweight Mental status - alert, oriented to person, place, and time, normal mood, behavior, speech, dress, motor activity, and thought processes Chest - clear to auscultation, no wheezes, rales or rhonchi, symmetric air entry Heart - normal rate and regular rhythm Abdomen - gravid uterus, 39 cm, suspected average gestational weight of infant Pelvic - VULVA: normal appearing vulva with no masses, tenderness or lesions, VAGINA: normal appearing vagina with normal color and discharge, no lesions, CERVIX: Cervix 1 cm long -2 soft vertex well applied Extremities - pedal edema 1+, intact peripheral pulses Prenatal labs: ABO, Rh:   Angel. positive  Antibody:   negative   Rubella:   immune  RPR:    negative  HBsAg:    negative  HIV:    negative  GBS:   negative  Assessment/Plan:  pregnancy 39 weeks 6 days history chronic hypertension not requiring meds this pregnancy for induction of labor  Admit, obtain PIH labs on admission Cytotec cervical ripening, subsequent Foley bulb and/or Pitocin    [name] V 09/17/2012, 10:02 PM

## 2012-09-18 ENCOUNTER — Telehealth (HOSPITAL_COMMUNITY): Payer: Self-pay | Admitting: *Deleted

## 2012-09-18 ENCOUNTER — Encounter (HOSPITAL_COMMUNITY): Payer: Self-pay | Admitting: *Deleted

## 2012-09-18 NOTE — Telephone Encounter (Signed)
Preadmission screen  

## 2012-09-23 ENCOUNTER — Inpatient Hospital Stay (HOSPITAL_COMMUNITY)
Admission: RE | Admit: 2012-09-23 | Discharge: 2012-09-27 | DRG: 766 | Disposition: A | Discharge status: Home / Self Care | Payer: Medicaid Other | Source: Ambulatory Visit | Attending: Obstetrics & Gynecology | Admitting: Obstetrics & Gynecology

## 2012-09-23 ENCOUNTER — Encounter (HOSPITAL_COMMUNITY): Payer: Self-pay

## 2012-09-23 DIAGNOSIS — O1002 Pre-existing essential hypertension complicating childbirth: Principal | ICD-10-CM | POA: Diagnosis present

## 2012-09-23 DIAGNOSIS — K8020 Calculus of gallbladder without cholecystitis without obstruction: Secondary | ICD-10-CM | POA: Insufficient documentation

## 2012-09-23 HISTORY — DX: Calculus of gallbladder without cholecystitis without obstruction: K80.20

## 2012-09-23 LAB — PROTEIN / CREATININE RATIO, URINE
Protein Creatinine Ratio: 0.12 (ref 0.00–0.15)
Total Protein, Urine: 23.3 mg/dL

## 2012-09-23 LAB — CBC
HCT: 34.7 % — ABNORMAL LOW (ref 36.0–46.0)
MCH: 29.5 pg (ref 26.0–34.0)
MCHC: 34 g/dL (ref 30.0–36.0)
MCV: 86.8 fL (ref 78.0–100.0)
RDW: 13.9 % (ref 11.5–15.5)

## 2012-09-23 LAB — COMPREHENSIVE METABOLIC PANEL
Albumin: 2.9 g/dL — ABNORMAL LOW (ref 3.5–5.2)
BUN: 8 mg/dL (ref 6–23)
Calcium: 9.9 mg/dL (ref 8.4–10.5)
Creatinine, Ser: 0.62 mg/dL (ref 0.50–1.10)
Potassium: 3.8 mEq/L (ref 3.5–5.1)
Total Protein: 6.2 g/dL (ref 6.0–8.3)

## 2012-09-23 LAB — URINALYSIS, ROUTINE W REFLEX MICROSCOPIC
Nitrite: NEGATIVE
Specific Gravity, Urine: 1.025 (ref 1.005–1.030)
Urobilinogen, UA: 0.2 mg/dL (ref 0.0–1.0)
pH: 6 (ref 5.0–8.0)

## 2012-09-23 LAB — URINE MICROSCOPIC-ADD ON

## 2012-09-23 LAB — RPR: RPR Ser Ql: NONREACTIVE

## 2012-09-23 LAB — ABO/RH: ABO/RH(D): O POS

## 2012-09-23 MED ORDER — TERBUTALINE SULFATE 1 MG/ML IJ SOLN: 0.2500 mg | Freq: Once | INTRAMUSCULAR | Status: AC | PRN

## 2012-09-23 MED ORDER — ONDANSETRON HCL 4 MG/2ML IJ SOLN
4.0000 mg | Freq: Four times a day (QID) | INTRAMUSCULAR | Status: DC | PRN
  Administered 2012-09-24 (×2): 4 mg via INTRAVENOUS
  Filled 2012-09-23 (×2): qty 2

## 2012-09-23 MED ORDER — CITRIC ACID-SODIUM CITRATE 334-500 MG/5ML PO SOLN
30.0000 mL | ORAL | Status: DC | PRN
  Administered 2012-09-24: 30 mL via ORAL
  Filled 2012-09-23: qty 15

## 2012-09-23 MED ORDER — NALBUPHINE SYRINGE 5 MG/0.5 ML
10.0000 mg | INJECTION | INTRAMUSCULAR | Status: DC | PRN
  Administered 2012-09-23: 10 mg via INTRAMUSCULAR
  Filled 2012-09-23 (×2): qty 1

## 2012-09-23 MED ORDER — MISOPROSTOL 25 MCG QUARTER TABLET
25.0000 ug | ORAL_TABLET | ORAL | Status: AC | PRN
  Administered 2012-09-23 (×3): 25 ug via VAGINAL
  Filled 2012-09-23 (×3): qty 0.25

## 2012-09-23 MED ORDER — LIDOCAINE HCL (PF) 1 % IJ SOLN: 30.0000 mL | INTRAMUSCULAR | Status: DC | PRN

## 2012-09-23 MED ORDER — OXYTOCIN BOLUS FROM INFUSION: 500.0000 mL | INTRAVENOUS | Status: DC

## 2012-09-23 MED ORDER — LACTATED RINGERS IV SOLN: 500.0000 mL | INTRAVENOUS | Status: DC | PRN

## 2012-09-23 MED ORDER — NALBUPHINE SYRINGE 5 MG/0.5 ML
10.0000 mg | INJECTION | INTRAMUSCULAR | Status: DC | PRN
  Administered 2012-09-23: 10 mg via INTRAVENOUS
  Filled 2012-09-23 (×2): qty 1

## 2012-09-23 MED ORDER — OXYTOCIN 40 UNITS IN LACTATED RINGERS INFUSION - SIMPLE MED: 62.5000 mL/h | INTRAVENOUS | Status: DC

## 2012-09-23 MED ORDER — OXYCODONE-ACETAMINOPHEN 5-325 MG PO TABS: 1.0000 | ORAL_TABLET | ORAL | Status: DC | PRN

## 2012-09-23 MED ORDER — LACTATED RINGERS IV SOLN
INTRAVENOUS | Status: DC
  Administered 2012-09-23: 14:00:00 via INTRAVENOUS

## 2012-09-23 MED ORDER — ACETAMINOPHEN 325 MG PO TABS
650.0000 mg | ORAL_TABLET | ORAL | Status: DC | PRN
  Administered 2012-09-23 – 2012-09-24 (×4): 650 mg via ORAL
  Filled 2012-09-23 (×4): qty 2

## 2012-09-23 MED ORDER — IBUPROFEN 600 MG PO TABS: 600.0000 mg | ORAL_TABLET | Freq: Four times a day (QID) | ORAL | Status: DC | PRN

## 2012-09-23 NOTE — Progress Notes (Signed)
Patient ID: [name], female   DOB: [birth year], 26 y.o.   MRN: [id number]  S: Pt states ctx are more intense - 6/10 at times and having to breathe through some of them. Has a mild headache, relieved by tylenol. No vision changes but eyes a little sensitive to the light.  O:  Filed Vitals:   09/23/12 1439 09/23/12 1513 09/23/12 1631 09/23/12 1657  BP: 137/66 121/57 125/75 141/93  Pulse: 84 86 72 72  Temp:    98 F (36.7 C)  TempSrc:    Oral  Resp: 20 18 19 20   Height:      Weight:       Cervix: 1/40-50/-3, posterior, soft  FHTs:  135, moderate variability, accels present, no decels TOCO: ctx q 3-4 minutes  A/P IOL for CHTN - Has made cervical change. 3rd dose cytotec placed, as very posterior and difficult for FB - BP controlled, headache mild and responding to tylenol - monitor  Napoleon Form, MD

## 2012-09-23 NOTE — Progress Notes (Signed)
Patient ID: [name], female   DOB: 1986-05-19, 26 y.o.   MRN: [id number]  S:  Contracting every 3-4 minutes, rating pain 4/10  O:   Filed Vitals:   09/23/12 1238 09/23/12 1337 09/23/12 1359 09/23/12 1400  BP: 136/61   142/75  Pulse: 80   81  Temp:   98.6 F (37 C) 98.6 F (37 C)  TempSrc:   Oral Oral  Resp: 18 20 17 18   Height:      Weight:        Dilation: closed Effacement (%):  (Long) Cervical Position: Posterior Station: -3 Presentation: Vertex Exam by:: Dr Thad Ranger  [facility]:  140-145, mod variability, accels present, early decel TOCO:  q 3-4 minutes  A/P Induction of labor for chronic HTN - BP controlled - Cervix still unfavorable, 2nd dose cytotec placed  Napoleon Form, MD 09/23/2012 2:17 PM

## 2012-09-23 NOTE — H&P (Signed)
[name] is a 26 y.o. female presenting for induction of labor for Chronic HTN. Maternal Medical History:  Reason for admission: Reason for admission: nausea.  Contractions: Onset was 1 week ago.   Frequency: regular.   Perceived severity is moderate.    Fetal activity: Perceived fetal activity is normal.   Last perceived fetal movement was within the past hour.    Prenatal complications: Hypertension.   Prenatal Complications - Diabetes: none.   Pt gets [facility], last visit Thursday 11/14. Has HTN, has not been on medications. BP rising over last few visits. 130-140s/90s. No headache or vision changes. Fetal growth has been appropriate. Dating by 8 week sono c/w LMP.   OB History    Grav Para Term Preterm Abortions TAB SAB Ect Mult Living   1              Past Medical History  Diagnosis Date  . Hypertension   . Polycystic ovarian syndrome   . Bronchitis   . Abnormal vaginal bleeding   . Sleep apnea   . Renal disorder     kidney stone  . Trichomonas contact, treated   . Obese   . Headache   . Gallstone    Past Surgical History  Procedure Date  . Tonsillectomy    Family History: family history includes Asthma in her brother; Diabetes in her maternal aunt, maternal grandfather, maternal grandmother, maternal uncle, and mother; Heart murmur in her brother; Hypertension in her maternal aunt and maternal grandfather; and Stroke in her maternal grandfather. Social History:  reports that she quit smoking about 8 months ago. Her smoking use included Cigarettes. She has never used smokeless tobacco. She reports that she does not drink alcohol or use illicit drugs.   Prenatal Transfer Tool  Maternal Diabetes: No Genetic Screening: Normal Maternal Ultrasounds/Referrals: Normal Fetal Ultrasounds or other Referrals:  None Maternal Substance Abuse:  No Significant Maternal Medications:  None Significant Maternal Lab Results:  None Other Comments:  Chronic  HTN, fetal growth normal  Review of Systems  Constitutional: Negative for fever and chills.  Eyes: Negative for blurred vision and double vision.  Respiratory: Negative for shortness of breath.   Cardiovascular: Negative for chest pain.  Gastrointestinal: Positive for nausea and vomiting. Abdominal pain: contractions occasionally, mild.  Genitourinary: Negative for dysuria.  Neurological: Negative for dizziness and headaches.      Blood pressure 124/65, pulse 89, temperature 98.9 F (37.2 C), temperature source Oral, resp. rate 20, height 5\' 7"  (1.702 m), weight 128.822 kg (284 lb), last menstrual period 12/12/2011. Maternal Exam:  Abdomen: Patient reports no abdominal tenderness. Fetal presentation: vertex  Introitus: Normal vulva. Normal vagina.  Pelvis: questionable for delivery.   Cervix: Cervix evaluated by digital exam.     Fetal Exam Fetal Monitor Review: Mode: ultrasound.   Baseline rate: 145.  Variability: moderate (6-25 bpm).   Pattern: accelerations present and late decelerations.    Fetal State Assessment: Category I - tracings are normal.     Physical Exam  Constitutional: She is oriented to person, place, and time. She appears well-developed and well-nourished. No distress.  HENT:  Head: Normocephalic and atraumatic.  Eyes: Conjunctivae normal and EOM are normal.  Neck: Normal range of motion. Neck supple.  Cardiovascular: Normal rate.   Respiratory: Effort normal. No respiratory distress.  GI: There is no rebound and no guarding.  Musculoskeletal: Normal range of motion. She exhibits no tenderness. Edema: mild.  Neurological: She is alert and  oriented to person, place, and time.  Skin: Skin is warm and dry.  Psychiatric: She has a normal mood and affect.    Dilation: 1 Effacement (%):  (lONG) Cervical Position: Middle Station: -3 Presentation: Vertex Exam by:: [email or web address],RNC-OB   Prenatal labs: ABO, Rh: O/Positive/-- (04/11 0000) Antibody: Negative  (04/11 0000) Rubella: Immune (04/11 0000) RPR: Nonreactive (04/11 0000)  HBsAg: Negative (04/11 0000)  HIV: Non-reactive (04/11 0000)  GBS: Negative (10/31 0000)   Assessment/Plan: 26 y.o. G1P0 at [redacted]w[redacted]d here for IOL for chronic HTN.  - Admit to L&D - Cytotec for cervical ripening - GBS negative - Monitor BP, urine protein/creatinine ratio  Napoleon Form 09/23/2012, 8:54 AM

## 2012-09-24 ENCOUNTER — Inpatient Hospital Stay (HOSPITAL_COMMUNITY): Admission: AD | Admit: 2012-09-24 | Payer: Self-pay | Source: Ambulatory Visit | Admitting: Obstetrics & Gynecology

## 2012-09-24 ENCOUNTER — Encounter (HOSPITAL_COMMUNITY): Payer: Self-pay | Admitting: Anesthesiology

## 2012-09-24 ENCOUNTER — Inpatient Hospital Stay (HOSPITAL_COMMUNITY): Payer: Medicaid Other | Admitting: Anesthesiology

## 2012-09-24 ENCOUNTER — Encounter (HOSPITAL_COMMUNITY)
Admission: RE | Disposition: A | Discharge status: Home / Self Care | Payer: Self-pay | Source: Ambulatory Visit | Attending: Obstetrics & Gynecology

## 2012-09-24 DIAGNOSIS — O1002 Pre-existing essential hypertension complicating childbirth: Secondary | ICD-10-CM

## 2012-09-24 LAB — CBC
HCT: 36.4 % (ref 36.0–46.0)
Hemoglobin: 11.5 g/dL — ABNORMAL LOW (ref 12.0–15.0)
Hemoglobin: 12.5 g/dL (ref 12.0–15.0)
MCV: 86.5 fL (ref 78.0–100.0)
Platelets: 214 10*3/uL (ref 150–400)
RBC: 3.94 MIL/uL (ref 3.87–5.11)
RBC: 4.16 MIL/uL (ref 3.87–5.11)
WBC: 9 10*3/uL (ref 4.0–10.5)
WBC: 15.4 10*3/uL — ABNORMAL HIGH (ref 4.0–10.5)

## 2012-09-24 SURGERY — Surgical Case
Anesthesia: Epidural | Site: Abdomen | Wound class: Clean Contaminated

## 2012-09-24 MED ORDER — SODIUM BICARBONATE 8.4 % IV SOLN
INTRAVENOUS | Status: DC | PRN
  Administered 2012-09-24: 3 mL via EPIDURAL

## 2012-09-24 MED ORDER — OXYCODONE-ACETAMINOPHEN 5-325 MG PO TABS
1.0000 | ORAL_TABLET | ORAL | Status: DC | PRN
  Administered 2012-09-25 – 2012-09-26 (×4): 1 via ORAL
  Administered 2012-09-26: 2 via ORAL
  Administered 2012-09-26 – 2012-09-27 (×3): 1 via ORAL
  Administered 2012-09-27 (×2): 2 via ORAL
  Filled 2012-09-24: qty 2
  Filled 2012-09-24 (×2): qty 1
  Filled 2012-09-24: qty 2
  Filled 2012-09-24: qty 1
  Filled 2012-09-24: qty 2
  Filled 2012-09-24 (×3): qty 1
  Filled 2012-09-24: qty 2
  Filled 2012-09-24: qty 1

## 2012-09-24 MED ORDER — NALOXONE HCL 0.4 MG/ML IJ SOLN: 0.4000 mg | INTRAMUSCULAR | Status: DC | PRN

## 2012-09-24 MED ORDER — EPHEDRINE 5 MG/ML INJ: 10.0000 mg | INTRAVENOUS | Status: DC | PRN

## 2012-09-24 MED ORDER — WITCH HAZEL-GLYCERIN EX PADS: 1.0000 "application " | MEDICATED_PAD | CUTANEOUS | Status: DC | PRN

## 2012-09-24 MED ORDER — LIDOCAINE HCL 2 % EX GEL
Freq: Once | CUTANEOUS | Status: AC
  Administered 2012-09-24: 1 via URETHRAL
  Filled 2012-09-24: qty 5

## 2012-09-24 MED ORDER — PRENATAL MULTIVITAMIN CH
1.0000 | ORAL_TABLET | Freq: Every day | ORAL | Status: DC
  Administered 2012-09-25 – 2012-09-27 (×3): 1 via ORAL
  Filled 2012-09-24 (×3): qty 1

## 2012-09-24 MED ORDER — BUPIVACAINE HCL (PF) 0.5 % IJ SOLN
INTRAMUSCULAR | Status: DC | PRN
  Administered 2012-09-24: 30 mL

## 2012-09-24 MED ORDER — LACTATED RINGERS IV SOLN
INTRAVENOUS | Status: DC
  Administered 2012-09-25 (×2): via INTRAVENOUS

## 2012-09-24 MED ORDER — DIPHENHYDRAMINE HCL 50 MG/ML IJ SOLN: 25.0000 mg | INTRAMUSCULAR | Status: DC | PRN

## 2012-09-24 MED ORDER — PHENYLEPHRINE HCL 10 MG/ML IJ SOLN
INTRAMUSCULAR | Status: DC | PRN
  Administered 2012-09-24: 40 ug via INTRAVENOUS
  Administered 2012-09-24 (×2): 80 ug via INTRAVENOUS

## 2012-09-24 MED ORDER — LACTATED RINGERS IV SOLN
INTRAVENOUS | Status: DC | PRN
  Administered 2012-09-24 (×2): via INTRAVENOUS

## 2012-09-24 MED ORDER — DIPHENHYDRAMINE HCL 25 MG PO CAPS: 25.0000 mg | ORAL_CAPSULE | Freq: Four times a day (QID) | ORAL | Status: DC | PRN

## 2012-09-24 MED ORDER — DEXTROSE 5 % IV SOLN
3.0000 g | INTRAVENOUS | Status: DC | PRN
  Administered 2012-09-24: 3 g via INTRAVENOUS

## 2012-09-24 MED ORDER — LANOLIN HYDROUS EX OINT: 1.0000 "application " | TOPICAL_OINTMENT | CUTANEOUS | Status: DC | PRN

## 2012-09-24 MED ORDER — PHENYLEPHRINE 40 MCG/ML (10ML) SYRINGE FOR IV PUSH (FOR BLOOD PRESSURE SUPPORT): 80.0000 ug | PREFILLED_SYRINGE | INTRAVENOUS | Status: DC | PRN

## 2012-09-24 MED ORDER — LABETALOL HCL 5 MG/ML IV SOLN
20.0000 mg | INTRAVENOUS | Status: DC | PRN
  Filled 2012-09-24: qty 4

## 2012-09-24 MED ORDER — KETOROLAC TROMETHAMINE 30 MG/ML IJ SOLN
30.0000 mg | Freq: Four times a day (QID) | INTRAMUSCULAR | Status: AC | PRN
  Administered 2012-09-25: 30 mg via INTRAVENOUS
  Filled 2012-09-24: qty 1

## 2012-09-24 MED ORDER — IBUPROFEN 600 MG PO TABS
600.0000 mg | ORAL_TABLET | Freq: Four times a day (QID) | ORAL | Status: DC
  Administered 2012-09-25 – 2012-09-27 (×9): 600 mg via ORAL
  Filled 2012-09-24 (×4): qty 1

## 2012-09-24 MED ORDER — FENTANYL CITRATE 0.05 MG/ML IJ SOLN
25.0000 ug | INTRAMUSCULAR | Status: DC | PRN
  Administered 2012-09-24: 50 ug via INTRAVENOUS

## 2012-09-24 MED ORDER — OXYTOCIN 40 UNITS IN LACTATED RINGERS INFUSION - SIMPLE MED
1.0000 m[IU]/min | INTRAVENOUS | Status: DC
  Administered 2012-09-24: 1 m[IU]/min via INTRAVENOUS
  Administered 2012-09-24: 2 m[IU]/min via INTRAVENOUS
  Filled 2012-09-24: qty 1000

## 2012-09-24 MED ORDER — FENTANYL CITRATE 0.05 MG/ML IJ SOLN
INTRAMUSCULAR | Status: AC
  Administered 2012-09-24: 50 ug via INTRAVENOUS
  Filled 2012-09-24: qty 2

## 2012-09-24 MED ORDER — NALOXONE HCL 1 MG/ML IJ SOLN
1.0000 ug/kg/h | INTRAMUSCULAR | Status: DC | PRN
  Filled 2012-09-24: qty 2

## 2012-09-24 MED ORDER — ONDANSETRON HCL 4 MG PO TABS: 4.0000 mg | ORAL_TABLET | ORAL | Status: DC | PRN

## 2012-09-24 MED ORDER — FENTANYL CITRATE 0.05 MG/ML IJ SOLN
INTRAMUSCULAR | Status: AC
  Filled 2012-09-24: qty 2

## 2012-09-24 MED ORDER — ONDANSETRON HCL 4 MG/2ML IJ SOLN: 4.0000 mg | INTRAMUSCULAR | Status: DC | PRN

## 2012-09-24 MED ORDER — OXYTOCIN 10 UNIT/ML IJ SOLN
40.0000 [IU] | INTRAVENOUS | Status: DC | PRN
  Administered 2012-09-24: 40 [IU] via INTRAVENOUS

## 2012-09-24 MED ORDER — SIMETHICONE 80 MG PO CHEW: 80.0000 mg | CHEWABLE_TABLET | ORAL | Status: DC | PRN

## 2012-09-24 MED ORDER — MENTHOL 3 MG MT LOZG: 1.0000 | LOZENGE | OROMUCOSAL | Status: DC | PRN

## 2012-09-24 MED ORDER — METOCLOPRAMIDE HCL 5 MG/ML IJ SOLN: 10.0000 mg | Freq: Three times a day (TID) | INTRAMUSCULAR | Status: DC | PRN

## 2012-09-24 MED ORDER — OXYTOCIN 40 UNITS IN LACTATED RINGERS INFUSION - SIMPLE MED: 62.5000 mL/h | INTRAVENOUS | Status: AC

## 2012-09-24 MED ORDER — CHLOROPROCAINE HCL 3 % IJ SOLN
INTRAMUSCULAR | Status: AC
  Filled 2012-09-24: qty 20

## 2012-09-24 MED ORDER — SODIUM BICARBONATE 8.4 % IV SOLN
INTRAVENOUS | Status: DC | PRN
  Administered 2012-09-24: 5 mL via EPIDURAL

## 2012-09-24 MED ORDER — SCOPOLAMINE 1 MG/3DAYS TD PT72
1.0000 | MEDICATED_PATCH | Freq: Once | TRANSDERMAL | Status: DC
  Administered 2012-09-24: 1.5 mg via TRANSDERMAL

## 2012-09-24 MED ORDER — DIPHENHYDRAMINE HCL 25 MG PO CAPS
25.0000 mg | ORAL_CAPSULE | ORAL | Status: DC | PRN
  Administered 2012-09-25: 25 mg via ORAL
  Filled 2012-09-24 (×2): qty 1

## 2012-09-24 MED ORDER — MEPERIDINE HCL 25 MG/ML IJ SOLN
INTRAMUSCULAR | Status: AC
  Administered 2012-09-24: 6.25 mg via INTRAVENOUS
  Filled 2012-09-24: qty 1

## 2012-09-24 MED ORDER — EPHEDRINE 5 MG/ML INJ
10.0000 mg | INTRAVENOUS | Status: DC | PRN
  Filled 2012-09-24: qty 4

## 2012-09-24 MED ORDER — LIDOCAINE HCL (PF) 1 % IJ SOLN
INTRAMUSCULAR | Status: DC | PRN
  Administered 2012-09-24 (×4): 4 mL

## 2012-09-24 MED ORDER — LABETALOL HCL 5 MG/ML IV SOLN
10.0000 mg | Freq: Once | INTRAVENOUS | Status: AC
  Administered 2012-09-24: 10 mg via INTRAVENOUS
  Filled 2012-09-24: qty 4

## 2012-09-24 MED ORDER — NALBUPHINE HCL 10 MG/ML IJ SOLN
5.0000 mg | INTRAMUSCULAR | Status: DC | PRN
  Administered 2012-09-25 (×2): 10 mg via INTRAVENOUS
  Filled 2012-09-24 (×2): qty 1

## 2012-09-24 MED ORDER — ZOLPIDEM TARTRATE 5 MG PO TABS: 5.0000 mg | ORAL_TABLET | Freq: Every evening | ORAL | Status: DC | PRN

## 2012-09-24 MED ORDER — KETOROLAC TROMETHAMINE 60 MG/2ML IM SOLN
60.0000 mg | Freq: Once | INTRAMUSCULAR | Status: AC | PRN
  Administered 2012-09-24: 60 mg via INTRAMUSCULAR

## 2012-09-24 MED ORDER — SIMETHICONE 80 MG PO CHEW
80.0000 mg | CHEWABLE_TABLET | Freq: Three times a day (TID) | ORAL | Status: DC
  Administered 2012-09-25 – 2012-09-27 (×10): 80 mg via ORAL

## 2012-09-24 MED ORDER — SCOPOLAMINE 1 MG/3DAYS TD PT72
MEDICATED_PATCH | TRANSDERMAL | Status: AC
  Filled 2012-09-24: qty 1

## 2012-09-24 MED ORDER — LABETALOL HCL 5 MG/ML IV SOLN: 20.0000 mg | INTRAVENOUS | Status: DC | PRN

## 2012-09-24 MED ORDER — NALBUPHINE HCL 10 MG/ML IJ SOLN
5.0000 mg | INTRAMUSCULAR | Status: DC | PRN
  Administered 2012-09-25: 10 mg via SUBCUTANEOUS
  Filled 2012-09-24 (×3): qty 1

## 2012-09-24 MED ORDER — SODIUM CHLORIDE 0.9 % IJ SOLN: 3.0000 mL | INTRAMUSCULAR | Status: DC | PRN

## 2012-09-24 MED ORDER — ONDANSETRON HCL 4 MG/2ML IJ SOLN
INTRAMUSCULAR | Status: DC | PRN
  Administered 2012-09-24: 4 mg via INTRAVENOUS

## 2012-09-24 MED ORDER — MORPHINE SULFATE 0.5 MG/ML IJ SOLN
INTRAMUSCULAR | Status: AC
  Filled 2012-09-24: qty 10

## 2012-09-24 MED ORDER — OXYTOCIN 10 UNIT/ML IJ SOLN
INTRAMUSCULAR | Status: AC
  Filled 2012-09-24: qty 4

## 2012-09-24 MED ORDER — ONDANSETRON HCL 4 MG/2ML IJ SOLN: 4.0000 mg | Freq: Three times a day (TID) | INTRAMUSCULAR | Status: DC | PRN

## 2012-09-24 MED ORDER — FENTANYL CITRATE 0.05 MG/ML IJ SOLN
100.0000 ug | INTRAMUSCULAR | Status: DC | PRN
  Administered 2012-09-24 (×2): 100 ug via INTRAVENOUS
  Filled 2012-09-24 (×3): qty 2

## 2012-09-24 MED ORDER — KETOROLAC TROMETHAMINE 30 MG/ML IJ SOLN: 30.0000 mg | Freq: Four times a day (QID) | INTRAMUSCULAR | Status: AC | PRN

## 2012-09-24 MED ORDER — DIPHENHYDRAMINE HCL 50 MG/ML IJ SOLN: 12.5000 mg | INTRAMUSCULAR | Status: DC | PRN

## 2012-09-24 MED ORDER — DEXTROSE 5 % IV SOLN
3.0000 g | INTRAVENOUS | Status: DC
  Filled 2012-09-24: qty 3000

## 2012-09-24 MED ORDER — PHENYLEPHRINE 40 MCG/ML (10ML) SYRINGE FOR IV PUSH (FOR BLOOD PRESSURE SUPPORT)
80.0000 ug | PREFILLED_SYRINGE | INTRAVENOUS | Status: DC | PRN
  Filled 2012-09-24: qty 5

## 2012-09-24 MED ORDER — DIPHENHYDRAMINE HCL 50 MG/ML IJ SOLN
12.5000 mg | INTRAMUSCULAR | Status: DC | PRN
  Administered 2012-09-25: 12.5 mg via INTRAVENOUS
  Filled 2012-09-24: qty 1

## 2012-09-24 MED ORDER — IBUPROFEN 600 MG PO TABS
600.0000 mg | ORAL_TABLET | Freq: Four times a day (QID) | ORAL | Status: DC | PRN
  Filled 2012-09-24 (×5): qty 1

## 2012-09-24 MED ORDER — LABETALOL HCL 300 MG PO TABS
300.0000 mg | ORAL_TABLET | Freq: Two times a day (BID) | ORAL | Status: DC
  Filled 2012-09-24 (×2): qty 1

## 2012-09-24 MED ORDER — TETANUS-DIPHTH-ACELL PERTUSSIS 5-2.5-18.5 LF-MCG/0.5 IM SUSP
0.5000 mL | Freq: Once | INTRAMUSCULAR | Status: AC
  Administered 2012-09-25: 0.5 mL via INTRAMUSCULAR
  Filled 2012-09-24: qty 0.5

## 2012-09-24 MED ORDER — LACTATED RINGERS IV SOLN: 500.0000 mL | Freq: Once | INTRAVENOUS | Status: DC

## 2012-09-24 MED ORDER — BUPIVACAINE HCL (PF) 0.5 % IJ SOLN
INTRAMUSCULAR | Status: AC
  Filled 2012-09-24: qty 30

## 2012-09-24 MED ORDER — MORPHINE SULFATE (PF) 0.5 MG/ML IJ SOLN
INTRAMUSCULAR | Status: DC | PRN
  Administered 2012-09-24: 1 mg via INTRAVENOUS
  Administered 2012-09-24: 4 mg via EPIDURAL

## 2012-09-24 MED ORDER — FENTANYL 2.5 MCG/ML BUPIVACAINE 1/10 % EPIDURAL INFUSION (WH - ANES)
14.0000 mL/h | INTRAMUSCULAR | Status: DC
  Administered 2012-09-24 (×4): 14 mL/h via EPIDURAL
  Filled 2012-09-24 (×5): qty 125

## 2012-09-24 MED ORDER — TERBUTALINE SULFATE 1 MG/ML IJ SOLN: 0.2500 mg | Freq: Once | INTRAMUSCULAR | Status: DC | PRN

## 2012-09-24 MED ORDER — MEPERIDINE HCL 25 MG/ML IJ SOLN
6.2500 mg | INTRAMUSCULAR | Status: DC | PRN
  Administered 2012-09-24: 6.25 mg via INTRAVENOUS

## 2012-09-24 MED ORDER — FENTANYL CITRATE 0.05 MG/ML IJ SOLN
INTRAMUSCULAR | Status: DC | PRN
  Administered 2012-09-24: 100 ug via EPIDURAL

## 2012-09-24 MED ORDER — DIBUCAINE 1 % RE OINT: 1.0000 "application " | TOPICAL_OINTMENT | RECTAL | Status: DC | PRN

## 2012-09-24 MED ORDER — SENNOSIDES-DOCUSATE SODIUM 8.6-50 MG PO TABS
2.0000 | ORAL_TABLET | Freq: Every day | ORAL | Status: DC
  Administered 2012-09-25 – 2012-09-26 (×2): 2 via ORAL

## 2012-09-24 MED ORDER — ONDANSETRON HCL 4 MG/2ML IJ SOLN
INTRAMUSCULAR | Status: AC
  Filled 2012-09-24: qty 2

## 2012-09-24 MED ORDER — LABETALOL HCL 300 MG PO TABS
300.0000 mg | ORAL_TABLET | Freq: Two times a day (BID) | ORAL | Status: DC
  Administered 2012-09-25 – 2012-09-27 (×5): 300 mg via ORAL
  Filled 2012-09-24 (×8): qty 1

## 2012-09-24 MED ORDER — KETOROLAC TROMETHAMINE 60 MG/2ML IM SOLN
INTRAMUSCULAR | Status: AC
  Filled 2012-09-24: qty 2

## 2012-09-24 SURGICAL SUPPLY — 43 items
APL SKNCLS STERI-STRIP NONHPOA (GAUZE/BANDAGES/DRESSINGS) ×1
BENZOIN TINCTURE PRP APPL 2/3 (GAUZE/BANDAGES/DRESSINGS) ×2 IMPLANT
BINDER ABD UNIV 10 28-50 (GAUZE/BANDAGES/DRESSINGS) ×1 IMPLANT
BINDER ABD UNIV 12 45-62 (WOUND CARE) IMPLANT
BINDER ABDOM UNIV 10 (GAUZE/BANDAGES/DRESSINGS)
BINDER ABDOMINAL 46IN 62IN (WOUND CARE)
CLOTH BEACON ORANGE TIMEOUT ST (SAFETY) ×2 IMPLANT
DRAPE SURG 17X23 STRL (DRAPES) ×1 IMPLANT
DRESSING TELFA 8X3 (GAUZE/BANDAGES/DRESSINGS) ×2 IMPLANT
DRSG COVADERM 4X10 (GAUZE/BANDAGES/DRESSINGS) ×1 IMPLANT
DURAPREP 26ML APPLICATOR (WOUND CARE) ×2 IMPLANT
ELECT REM PT RETURN 9FT ADLT (ELECTROSURGICAL) ×2
ELECTRODE REM PT RTRN 9FT ADLT (ELECTROSURGICAL) ×1 IMPLANT
EXTRACTOR VACUUM M CUP 4 TUBE (SUCTIONS) IMPLANT
GLOVE BIO SURGEON STRL SZ7 (GLOVE) ×2 IMPLANT
GLOVE BIOGEL PI IND STRL 7.0 (GLOVE) ×2 IMPLANT
GLOVE BIOGEL PI INDICATOR 7.0 (GLOVE) ×2
GOWN PREVENTION PLUS LG XLONG (DISPOSABLE) ×2 IMPLANT
GOWN STRL REIN XL XLG (GOWN DISPOSABLE) ×2 IMPLANT
KIT ABG SYR 3ML LUER SLIP (SYRINGE) ×1 IMPLANT
NDL HYPO 25X5/8 SAFETYGLIDE (NEEDLE) IMPLANT
NEEDLE HYPO 22GX1.5 SAFETY (NEEDLE) ×2 IMPLANT
NEEDLE HYPO 25X5/8 SAFETYGLIDE (NEEDLE) ×2 IMPLANT
NS IRRIG 1000ML POUR BTL (IV SOLUTION) ×2 IMPLANT
PACK C SECTION WH (CUSTOM PROCEDURE TRAY) ×2 IMPLANT
PAD ABD 7.5X8 STRL (GAUZE/BANDAGES/DRESSINGS) ×2 IMPLANT
PAD OB MATERNITY 4.3X12.25 (PERSONAL CARE ITEMS) ×1 IMPLANT
RTRCTR C-SECT PINK 25CM LRG (MISCELLANEOUS) IMPLANT
SLEEVE SCD COMPRESS KNEE MED (MISCELLANEOUS) ×1 IMPLANT
SPONGE SURGIFOAM ABS GEL 12-7 (HEMOSTASIS) IMPLANT
STAPLER VISISTAT 35W (STAPLE) IMPLANT
STRIP CLOSURE SKIN 1/2X4 (GAUZE/BANDAGES/DRESSINGS) ×2 IMPLANT
SUT PDS AB 0 CTX 60 (SUTURE) IMPLANT
SUT PLAIN 0 NONE (SUTURE) IMPLANT
SUT SILK 0 TIES 10X30 (SUTURE) IMPLANT
SUT VIC AB 0 CT1 36 (SUTURE) ×6 IMPLANT
SUT VIC AB 3-0 CT1 27 (SUTURE) ×2
SUT VIC AB 3-0 CT1 TAPERPNT 27 (SUTURE) ×1 IMPLANT
SUT VIC AB 4-0 KS 27 (SUTURE) ×1 IMPLANT
SYR CONTROL 10ML LL (SYRINGE) ×2 IMPLANT
TOWEL OR 17X24 6PK STRL BLUE (TOWEL DISPOSABLE) ×4 IMPLANT
TRAY FOLEY CATH 14FR (SET/KITS/TRAYS/PACK) ×1 IMPLANT
WATER STERILE IRR 1000ML POUR (IV SOLUTION) ×1 IMPLANT

## 2012-09-24 NOTE — Anesthesia Preprocedure Evaluation (Addendum)
Anesthesia Evaluation  Patient identified by MRN, date of birth, ID band Patient awake    Reviewed: Allergy & Precautions, H&P , NPO status , Patient's Chart, lab work & pertinent test results, reviewed documented beta blocker date and time   History of Anesthesia Complications Negative for: history of anesthetic complications  Airway Mallampati: III TM Distance: >3 FB Neck ROM: full    Dental  (+) Teeth Intact   Pulmonary sleep apnea (currently does not have a machine) and Continuous Positive Airway Pressure Ventilation ,  breath sounds clear to auscultation        Cardiovascular hypertension (CHTN), Rhythm:regular Rate:Normal     Neuro/Psych  Headaches, negative psych ROS   GI/Hepatic negative GI ROS, Neg liver ROS,   Endo/Other  Morbid obesityPCOS  Renal/GU Renal disease (h/o kidney stone)     Musculoskeletal   Abdominal   Peds  Hematology negative hematology ROS (+)   Anesthesia Other Findings   Reproductive/Obstetrics (+) Pregnancy                          Anesthesia Physical Anesthesia Plan  ASA: III  Anesthesia Plan: Epidural   Post-op Pain Management:    Induction:   Airway Management Planned:   Additional Equipment:   Intra-op Plan:   Post-operative Plan:   Informed Consent: I have reviewed the patients History and Physical, chart, labs and discussed the procedure including the risks, benefits and alternatives for the proposed anesthesia with the patient or authorized representative who has indicated his/her understanding and acceptance.     Plan Discussed with:   Anesthesia Plan Comments:         Anesthesia Quick Evaluation

## 2012-09-24 NOTE — Progress Notes (Signed)
[name] is a 26 y.o. G1P0000 at [redacted]w[redacted]d  Subjective: Back pressure with contractions but no rectal pressure  Objective: BP 139/86  Pulse 97  Temp 98.5 F (36.9 C) (Oral)  Resp 18  Ht 5\' 7"  (1.702 m)  Wt 128.822 kg (284 lb)  BMI 44.48 kg/m2  SpO2 100%  LMP 12/12/2011 I/O last 3 completed shifts: In: -  Out: 2450 [Urine:1000; Emesis/NG output:1450]    FHT:  FHR: 130 bpm, variability: moderate,  accelerations:  Abscent,  decelerations:  Present variable UC:   regular, every 2.5 minutes SVE:   Dilation: 9.5 Effacement (%): 90 Station: -2 Exam by: Wynelle Bourgeois, CNM  Labs: Lab Results  Component Value Date   WBC 9.0 09/24/2012   HGB 11.5* 09/24/2012   HCT 34.1* 09/24/2012   MCV 86.5 09/24/2012   PLT 214 09/24/2012    Assessment / Plan: Induction of labor due to chronic HTN,  progressing well on pitocin -BPs improved, continue to monitor -Occiput posterior position -Dr. Tomi Bamberger to evaluate, possible section  Labor: Progressing normally Fetal Wellbeing:  Category II Pain Control:  Epidural Anticipated MOD:  NSVD; possible section  Kolten Ryback 09/24/2012, 7:11 PM

## 2012-09-24 NOTE — Progress Notes (Signed)
DEKISHA MESMER is a 26 y.o. G1P0000 at [redacted]w[redacted]d Subjective: Intermittent N/V  Objective: BP 186/101  Pulse 97  Temp 98.5 F (36.9 C) (Oral)  Resp 18  Ht 5\' 7"  (1.702 m)  Wt 128.822 kg (284 lb)  BMI 44.48 kg/m2  SpO2 100%  LMP 12/12/2011   Total I/O In: -  Out: 2450 [Urine:1000; Emesis/NG output:1450]  FHT:  FHR: 140 bpm, variability: moderate,  accelerations:  Present,  decelerations:  Absent UC:   regular, every 2-4 minutes SVE:   Dilation: 9 Effacement (%): 90 Station: 0;+1 Exam by:: Dr Erin Fulling  Labs: Lab Results  Component Value Date   WBC 9.0 09/24/2012   HGB 11.5* 09/24/2012   HCT 34.1* 09/24/2012   MCV 86.5 09/24/2012   PLT 214 09/24/2012    Assessment / Plan: Induction of labor due to chronic HTN,  progressing well on pitocin BP's elevated systolics >180  -one-time dose of Labetalol 10 mg  -monitor, consider repeat labetalol and/or PRN dosing  Labor: Progressing normally, SROM Fetal Wellbeing:  Category I Pain Control:  Epidural Anticipated MOD:  NSVD  Olaf Mesa 09/24/2012, 6:24 PM

## 2012-09-24 NOTE — Transfer of Care (Signed)
Immediate Anesthesia Transfer of Care Note  Patient: [name]  Procedure(s) Performed: Procedure(s) (LRB) with comments: CESAREAN SECTION (N/A)  Patient Location: PACU  Anesthesia Type:Spinal  Level of Consciousness: awake, alert  and oriented  Airway & Oxygen Therapy: Patient Spontanous Breathing  Post-op Assessment: Report given to PACU RN and Post -op Vital signs reviewed and stable  Post vital signs: Reviewed and stable  Complications: No apparent anesthesia complications

## 2012-09-24 NOTE — Op Note (Signed)
[name]  PROCEDURE DATE: 09/24/2012  PREOPERATIVE DIAGNOSIS: Intrauterine pregnancy at [redacted]w[redacted]d gestation; failure to progress: arrest of dilation  POSTOPERATIVE DIAGNOSIS: The same  PROCEDURE: Primary Low Transverse Cesarean Section  SURGEON:  Dr. Eber Jones L. Harraway-Smith  ASSISTANT:  Napoleon Form, MD   INDICATIONS: [name] is a 26 y.o. G1P0000 at [redacted]w[redacted]d here for cesarean section secondary to the indications listed under preoperative diagnosis; please see preoperative note for further details.  The patient was admitted for induction of labor for chronic hypertension. She underwent cervical ripening with cytotec and induction with pitocin. She dilated to 9 cm and -1 station but remained there for several hours. After discussion with the patient, she decided to proceed with cesarean delivery. The risks of cesarean section were discussed with the patient including but were not limited to: bleeding which may require transfusion or reoperation; infection which may require antibiotics; injury to bowel, bladder, ureters or other surrounding organs; injury to the fetus; need for additional procedures including hysterectomy in the event of a life-threatening hemorrhage; placental abnormalities wth subsequent pregnancies, incisional problems, thromboembolic phenomenon and other postoperative/anesthesia complications.   The patient concurred with the proposed plan, giving informed written consent for the procedure.    FINDINGS:  Viable female infant in cephalic presentation.  Apgars 2 and 8, cord pH 7.26.  Clear amniotic fluid.  Intact placenta, three vessel cord.  Normal uterus, fallopian tubes and ovaries bilaterally.  ANESTHESIA: Epidural INTRAVENOUS FLUIDS: 1000 ml ESTIMATED BLOOD LOSS: 700 ml URINE OUTPUT:  200 ml SPECIMENS: Placenta sent to L&D COMPLICATIONS: None immediate  PROCEDURE IN DETAIL:  The patient preoperatively received intravenous antibiotics and had sequential  compression devices applied to her lower extremities.  She was then taken to the operating room where epidural anesthesia was redosed and was found to be adequate. She was then placed in a dorsal supine position with a leftward tilt, and prepped and draped in a sterile manner.  A foley catheter was placed into her bladder and attached to constant gravity.  After an adequate timeout was performed, a Pfannenstiel skin incision was made with scalpel and carried through to the underlying layer of fascia. The fascia was incised in the midline, and this incision was extended bilaterally using the Mayo scissors.  Kocher clamps were applied to the superior aspect of the fascial incision and the underlying rectus muscles were dissected off bluntly. A similar process was carried out on the inferior aspect of the fascial incision. The rectus muscles were separated in the midline bluntly and the peritoneum was entered bluntly. Attention was turned to the lower uterine segment where a low transverse hysterotomy incision was made with a scalpel and extended bilaterally bluntly and with bandage scissors.  The infant was successfully delivered, the cord was clamped and cut and the infant was handed over to awaiting neonatology team. Uterine massage was then administered, and the placenta delivered intact with a three-vessel cord. The uterus was then exteriorized and cleared of clot and debris.  The hysterotomy was closed with 0 Vicryl in a running locked fashion, and an imbricating layer was also placed with the same suture. The uterus was returned to the pelvis. The pelvis was cleared of all clot and debris. Hemostasis was confirmed on all surfaces.  The peritoneum and the muscles were reapproximated using 0 Vicryl in 1 interrupted suture. The fascia was then closed using 0 Vicryl in a running fashion.  The subcutaneous layer was irrigated, then reapproximated with 3-0 vicryl in a  running fashion, and the skin was closed with a  4-0 Vicryl subcuticular stitch. The patient tolerated the procedure well. Sponge, lap, instrument and needle counts were correct x 2.  She was taken to the recovery room in stable condition.   Napoleon Form, MD 09/24/2012 9:04 PM

## 2012-09-24 NOTE — Progress Notes (Addendum)
[name] is a 26 y.o. G1P0000 at [redacted]w[redacted]d Subjective: Discomfort with contractions, otherwise doing well  Objective: BP 171/92  Pulse 86  Temp 98.6 F (37 C) (Oral)  Resp 20  Ht 5\' 7"  (1.702 m)  Wt 128.822 kg (284 lb)  BMI 44.48 kg/m2  SpO2 100%  LMP 12/12/2011   Total I/O In: -  Out: 650 [Emesis/NG output:650]  FHT:  FHR: 150 bpm, variability: moderate,  accelerations:  Abscent,  decelerations:  Absent UC:   Difficult to monitor, IUPC placed SVE:   Dilation: 8 Effacement (%): 90 Station: -1 Exam by:: Erline Hau RNC  Labs: Lab Results  Component Value Date   WBC 9.0 09/24/2012   HGB 11.5* 09/24/2012   HCT 34.1* 09/24/2012   MCV 86.5 09/24/2012   PLT 214 09/24/2012    Assessment / Plan: Spontaneous labor, progressing normally; IUPC placed to better monitor contractions (placed x2)  Labor: Previously progressing on pitocin, stopped for ?decels Fetal Wellbeing:  Category II Pain Control:  Epidural Anticipated MOD:  NSVD  Darrol Brandenburg 09/24/2012, 1:27 PM

## 2012-09-24 NOTE — Anesthesia Postprocedure Evaluation (Signed)
Anesthesia Post Note  Patient: [name]  Procedure(s) Performed: Procedure(s) (LRB): CESAREAN SECTION (N/A)  Anesthesia type: Epidural  Patient location: PACU  Post pain: Pain level controlled  Post assessment: Post-op Vital signs reviewed  Last Vitals:  Filed Vitals:   09/24/12 2215  BP: 138/83  Pulse:   Temp:   Resp: 16    Post vital signs: stable  Level of consciousness: awake  Complications: No apparent anesthesia complications

## 2012-09-24 NOTE — Progress Notes (Signed)
Clelia Croft, CNM called notified of SROM, SVE and FHR decels. No further orders at this time

## 2012-09-24 NOTE — Progress Notes (Signed)
GLENDER AUGUSTA is a 26 y.o. G1P0000 at [redacted]w[redacted]d   Subjective: Breathing with ctx; good relief from fentanyl  Objective: BP 137/68  Pulse 74  Temp 98.3 F (36.8 C) (Oral)  Resp 18  Ht 5\' 7"  (1.702 m)  Wt 128.822 kg (284 lb)  BMI 44.48 kg/m2  LMP 12/12/2011      FHT:  FHR: 130 bpm, variability: moderate,  accelerations:  Present,  decelerations:  Absent UC:   Difficult to trace- appear to be q 3-4 mins with 76mu/min Pitocin SVE:   Dilation: 5 Effacement (%): 50 Station: -3 Exam by:: Clelia Croft, CNM- unchanged  Labs: Lab Results  Component Value Date   WBC 9.0 09/24/2012   HGB 11.5* 09/24/2012   HCT 34.1* 09/24/2012   MCV 86.5 09/24/2012   PLT 214 09/24/2012    Assessment / Plan: IOL process  Continue to increase Pitocin to achieve active labor  Cam Hai 09/24/2012, 4:44 AM

## 2012-09-24 NOTE — Progress Notes (Signed)
Angel [name] is a 26 y.o. G1P0000 at [redacted]w[redacted]d by LMP admitted for induction of labor due to Hypertension.  Subjective: Pt c/o nausea and emesis.  Has had issues with comfort all day.  Objective: BP 169/81  Pulse 92  Temp 98.2 F (36.8 C) (Oral)  Resp 20  Ht 5\' 7"  (1.702 m)  Wt 128.822 kg (284 lb)  BMI 44.48 kg/m2  SpO2 100%  LMP 12/12/2011   Total I/O In: -  Out: 650 [Emesis/NG output:650]  FHT:  FHR: 120-130's bpm, variability: moderate,  accelerations:  Present,  decelerations:  Present variable decels occ. UC:   regular, every 2-3 minutes.  SVE:   Dilation: 9 Effacement (%): 90 Station: 0;+1 Exam by:: Dr Angel [name]  Labs: Lab Results  Component Value Date   WBC 9.0 09/24/2012   HGB 11.5* 09/24/2012   HCT 34.1* 09/24/2012   MCV 86.5 09/24/2012   PLT 214 09/24/2012    Assessment / Plan: IOL slow progression.  Recently restarted on pitocin.  Still at a high station the caput is a 0 to +1 station.  Labor: contineu to increase pitocin.  IUPC in place. Not adequate by MVU's Preeclampsia:  no signs or symptoms of toxicity Fetal Wellbeing:  Category II Pain Control:  Epidural I/D:  n/a Anticipated MOD:  NSVD  [name], Angel [name] 09/24/2012, 3:53 PM

## 2012-09-24 NOTE — Progress Notes (Signed)
[name] is a 26 y.o. G1P0000 at [redacted]w[redacted]d byw ultrasound admitted for [facility], at 40 weeks Pt was on antihypertensives prior to pregnancy, but not during preg.   Subjective:some pressure sx lite bloody show after foley bulb   Objective: BP 144/67  Pulse 83  Temp 98.7 F (37.1 C) (Oral)  Resp 18  Ht 5\' 7"  (1.702 m)  Wt 284 lb (128.822 kg)  BMI 44.48 kg/m2  LMP 12/12/2011      FHT:  FHR: 145 bpm, variability: moderate,  accelerations:  Present,  decelerations:  Absent UC:   irregular, every 5-7 minutes SVE:   Dilation: 5 Effacement (%): 50 Station: -3 Exam by:: Dr. Bobbye Morton reaches to outer edge of cervix  Labs: Lab Results  Component Value Date   WBC 8.4 09/23/2012   HGB 11.8* 09/23/2012   HCT 34.7* 09/23/2012   MCV 86.8 09/23/2012   PLT 234 09/23/2012    Assessment / Plan: induction of labor s/p cytotec and foley,   Labor: needs pitocin Preeclampsia:  no signs or symptoms of toxicity Fetal Wellbeing:  Category I Pain Control:  Labor support without medications I/D:   Anticipated MOD:  uncertain prognosis due to narrow pelvis. Will initiate pitocin and monitor  [name] V 09/24/2012, 2:17 AM

## 2012-09-24 NOTE — Anesthesia Procedure Notes (Signed)
Epidural Patient location during procedure: OB Start time: 09/24/2012 5:44 AM  Staffing Performed by: anesthesiologist   Preanesthetic Checklist Completed: patient identified, site marked, surgical consent, pre-op evaluation, timeout performed, IV checked, risks and benefits discussed and monitors and equipment checked  Epidural Patient position: sitting Prep: site prepped and draped and DuraPrep Patient monitoring: continuous pulse ox and blood pressure Approach: midline Injection technique: LOR air  Needle:  Needle type: Tuohy  Needle gauge: 17 G Needle length: 9 cm and 9 Needle insertion depth: 7.5 cm Catheter type: closed end flexible Catheter size: 19 Gauge Catheter at skin depth: 12.5 cm Test dose: negative  Assessment Events: blood not aspirated, injection not painful, no injection resistance, negative IV test and no paresthesia  Additional Notes Discussed risk of headache, infection, bleeding, nerve injury and failed or incomplete block.  Patient voices understanding and wishes to proceed. Reason for block:procedure for pain

## 2012-09-24 NOTE — Progress Notes (Signed)
[name] is a 26 y.o. G1P0000 at [redacted]w[redacted]d   Subjective: Coping well with ctx; received Nubain earlier  Objective: BP 142/85  Pulse 75  Temp 98.7 F (37.1 C) (Oral)  Resp 18  Ht 5\' 7"  (1.702 m)  Wt 128.822 kg (284 lb)  BMI 44.48 kg/m2  LMP 12/12/2011      FHT:  FHR: 130 bpm, variability: minimal ,  accelerations:  Abscent,  decelerations:  Absent- currently no accels, most likely from medication UC:   Difficult to trace, but seem to be approx 5 mins apart per pt SVE:   Dilation: 2 Effacement (%): 70 Station: -3 Exam by:: Pincus Badder, CNM- foley balloon inserted without difficulty  Labs: Lab Results  Component Value Date   WBC 8.4 09/23/2012   HGB 11.8* 09/23/2012   HCT 34.7* 09/23/2012   MCV 86.8 09/23/2012   PLT 234 09/23/2012    Assessment / Plan: IOL process CHTN- stable without meds  Leave foley in place until it spontaneously comes out  SHAW, [facility] 09/24/2012, 12:01 AM

## 2012-09-25 ENCOUNTER — Encounter (HOSPITAL_COMMUNITY): Payer: Self-pay

## 2012-09-25 LAB — CBC
Hemoglobin: 10.3 g/dL — ABNORMAL LOW (ref 12.0–15.0)
MCH: 30.1 pg (ref 26.0–34.0)
MCV: 86.4 fL (ref 78.0–100.0)
RBC: 3.46 MIL/uL — ABNORMAL LOW (ref 3.87–5.11)

## 2012-09-25 NOTE — Anesthesia Postprocedure Evaluation (Signed)
  Anesthesia Post-op Note  Patient: [place] N Favero  Procedure(s) Performed: Procedure(s) (LRB) with comments: CESAREAN SECTION (N/A)  Patient Location: Mother/Baby  Anesthesia Type:Epidural  Level of Consciousness: awake  Airway and Oxygen Therapy: Patient Spontanous Breathing  Post-op Pain: none  Post-op Assessment: Patient's Cardiovascular Status Stable, Respiratory Function Stable, Patent Airway, No signs of Nausea or vomiting, Adequate PO intake, Pain level controlled, No headache, No backache, No residual numbness and No residual motor weakness  Post-op Vital Signs: Reviewed and stable  Complications: No apparent anesthesia complications

## 2012-09-25 NOTE — Progress Notes (Signed)
Subjective: Postpartum Day 1 Cesarean Delivery Patient reports tolerating PO and + flatus.    Objective: Vital signs in last 24 hours: Temp:  [97.9 F (36.6 C)-101.2 F (38.4 C)] 98.4 F (36.9 C) (11/19 0540) Pulse Rate:  [71-114] 93  (11/19 0540) Resp:  [16-28] 18  (11/19 0540) BP: (115-196)/(64-112) 135/81 mmHg (11/19 0540) SpO2:  [97 %-100 %] 98 % (11/19 0540)  Physical Exam:  General: alert, cooperative, no distress and moderately obese Lochia: appropriate Uterine Fundus: firm Incision: Moderate amount of bloody discharge on dressing. Will apply pressure dressing.     Basename 09/25/12 0510 09/24/12 2115  HGB 10.3* 12.5  HCT 29.9* 36.4    Assessment/Plan: Status post Cesarean section. Doing well postoperatively.  Continue current care.  Tawnya Crook 09/25/2012, 6:46 AM

## 2012-09-25 NOTE — Progress Notes (Signed)
UR chart review completed.  

## 2012-09-25 NOTE — Addendum Note (Signed)
Addendum  created 09/25/12 1040 by Suella Grove, CRNA   Modules edited:Notes Section

## 2012-09-25 NOTE — Anesthesia Postprocedure Evaluation (Signed)
  Anesthesia Post-op Note  Patient: [name]  Procedure(s) Performed: Procedure(s) (LRB) with comments: CESAREAN SECTION (N/A)  Patient Location: PACU  Anesthesia Type:Epidural  Level of Consciousness: awake  Airway and Oxygen Therapy: Patient Spontanous Breathing  Post-op Pain: none  Post-op Assessment: Patient's Cardiovascular Status Stable, Respiratory Function Stable, Patent Airway, No signs of Nausea or vomiting, Adequate PO intake, Pain level controlled, No headache, No backache, No residual numbness and No residual motor weakness  Post-op Vital Signs: Reviewed and stable  Complications: No apparent anesthesia complications

## 2012-09-25 NOTE — Addendum Note (Signed)
Addendum  created 09/25/12 1041 by Suella Grove, CRNA   Modules edited:Charges VN, Notes Section

## 2012-09-26 NOTE — Plan of Care (Signed)
Problem: Phase II Progression Outcomes Goal: Incision intact & without signs/symptoms of infection Outcome: Completed/Met Date Met:  09/26/12 Steri strips and internal sutures steri's have started to come off after shower.

## 2012-09-26 NOTE — Progress Notes (Signed)
Subjective: Postpartum Day 2: Cesarean Delivery Patient reports tolerating PO and + flatus.    Objective: Vital signs in last 24 hours: Temp:  [97.5 F (36.4 C)-98.5 F (36.9 C)] 98.5 F (36.9 C) (11/19 2020) Pulse Rate:  [98-112] 99  (11/19 2020) Resp:  [18-20] 20  (11/19 2020) BP: (101-120)/(66-82) 102/69 mmHg (11/19 2020) SpO2:  [98 %-100 %] 100 % (11/19 2020)  Physical Exam:  General: alert, cooperative, no distress and moderately obese Lochia: appropriate Uterine Fundus: firm Incision: no significant drainage, no dehiscence DVT Evaluation: No evidence of DVT seen on physical exam.   Basename 09/25/12 0510 09/24/12 2115  HGB 10.3* 12.5  HCT 29.9* 36.4    Assessment/Plan: Status post Cesarean section. Doing well postoperatively.  Continue current care. Planning breast feeding, outpt circ. Uncertain of birth control; possibly going to try Mirena but reports previous birth control methods have "increased her blood pressure."  Street, [employer] 09/26/2012, 7:28 AM

## 2012-09-27 MED ORDER — DOCUSATE SODIUM 100 MG PO CAPS: 100.0000 mg | ORAL_CAPSULE | Freq: Two times a day (BID) | ORAL | Status: DC | PRN

## 2012-09-27 MED ORDER — IBUPROFEN 600 MG PO TABS: 600.0000 mg | ORAL_TABLET | Freq: Four times a day (QID) | ORAL | Status: DC | PRN

## 2012-09-27 MED ORDER — OXYCODONE-ACETAMINOPHEN 5-325 MG PO TABS: 1.0000 | ORAL_TABLET | ORAL | Status: DC | PRN

## 2012-09-27 NOTE — Progress Notes (Signed)
I saw and examined patient and agree with above note.  Home tomorrow.  Napoleon Form, MD

## 2012-09-27 NOTE — Discharge Summary (Signed)
Obstetric Discharge Summary  BENJAMIN MERRIHEW is a 26 y.o. G1P1001 who presented for IOL at [redacted]w[redacted]d for chronic HTN. She underwent cervical ripening with cytotec and induction with pitocin. She had arrest of descent and dilation and was taken for primary cesarean section.   Reason for Admission: Induction of labor for chronic HTN Prenatal Procedures: none Intrapartum Procedures: cesarean: low cervical, transverse Postpartum Procedures: none Complications-Operative and Postpartum: none Hemoglobin  Date Value Range Status  09/25/2012 10.3* 12.0 - 15.0 g/dL Final     DELTA CHECK NOTED     REPEATED TO VERIFY     HCT  Date Value Range Status  09/25/2012 29.9* 36.0 - 46.0 % Final    Physical Exam:  General: alert, cooperative and no distress Lochia: appropriate Uterine Fundus: firm Incision: no dehiscence, no significant erythema, slight clear drainage present DVT Evaluation: No evidence of DVT seen on physical exam. Negative Homan's sign. No cords or calf tenderness.  Discharge Diagnoses: Term Pregnancy-delivered  Discharge Information: Date: 09/27/2012 Activity: pelvic rest Diet: routine Medications: PNV, Ibuprofen, Colace and Percocet Condition: stable Instructions: refer to practice specific booklet Discharge to: home Follow-up Information    Follow up with [facility]. In 1 week. (For incision check, 4 weeks for postpartum visit. Call  as needed if symptoms worsen)    Contact information:   [address] [place] [address] [phone]         Newborn Data: Live born female  Birth Weight: 7 lb 6 oz (3345 g) APGAR: 2, 8  Home with mother.  Napoleon Form 09/27/2012, 7:50 AM

## 2012-10-08 NOTE — Op Note (Signed)
I was scrubbed and precent for the entire procedure.  There were no complications.  Son Barkan L. Harraway-Smith, M.D., Evern Core

## 2013-02-14 ENCOUNTER — Encounter: Payer: Self-pay | Admitting: Obstetrics & Gynecology

## 2013-02-14 ENCOUNTER — Ambulatory Visit (INDEPENDENT_AMBULATORY_CARE_PROVIDER_SITE_OTHER): Payer: Medicaid Other | Admitting: Obstetrics & Gynecology

## 2013-02-14 VITALS — BP 140/100 | Ht 67.0 in | Wt 275.0 lb

## 2013-02-14 DIAGNOSIS — I10 Essential (primary) hypertension: Secondary | ICD-10-CM | POA: Insufficient documentation

## 2013-02-14 DIAGNOSIS — I1A0 Resistant hypertension: Secondary | ICD-10-CM

## 2013-02-14 HISTORY — DX: Essential (primary) hypertension: I10

## 2013-02-14 HISTORY — DX: Resistant hypertension: I1A.0

## 2013-02-14 NOTE — Progress Notes (Signed)
Patient ID: [name], female   DOB: 21-Jan-1986, 27 y.o.   MRN: [id number] [name] is in today for followup her blood pressure she is on Norvasc 10 mg a day Lisinopril 10 mg a day and hydrochlorothiazide 25 mg a day. Her blood pressure today is 140/100 and she did take her blood pressure medicine this morning. This certainly means w ande cannot stop her blood pressure medicine or alter it at this point, we were see her back in 3 months for routine evaluation.   patient is without other complaints or problems and again will see her back in 3 months  and we will do her yearly at that time as well

## 2013-02-14 NOTE — Patient Instructions (Signed)

## 2013-02-28 ENCOUNTER — Encounter: Payer: Self-pay | Admitting: *Deleted

## 2013-02-28 DIAGNOSIS — G4730 Sleep apnea, unspecified: Secondary | ICD-10-CM

## 2013-03-01 ENCOUNTER — Ambulatory Visit (INDEPENDENT_AMBULATORY_CARE_PROVIDER_SITE_OTHER): Payer: Medicaid Other | Admitting: Adult Health

## 2013-03-01 ENCOUNTER — Encounter: Payer: Self-pay | Admitting: Adult Health

## 2013-03-01 VITALS — BP 136/82 | Ht 67.0 in | Wt 279.0 lb

## 2013-03-01 DIAGNOSIS — Z30430 Encounter for insertion of intrauterine contraceptive device: Secondary | ICD-10-CM | POA: Insufficient documentation

## 2013-03-01 DIAGNOSIS — B49 Unspecified mycosis: Secondary | ICD-10-CM

## 2013-03-01 DIAGNOSIS — N899 Noninflammatory disorder of vagina, unspecified: Secondary | ICD-10-CM

## 2013-03-01 DIAGNOSIS — B379 Candidiasis, unspecified: Secondary | ICD-10-CM

## 2013-03-01 DIAGNOSIS — Z1389 Encounter for screening for other disorder: Secondary | ICD-10-CM

## 2013-03-01 DIAGNOSIS — Z975 Presence of (intrauterine) contraceptive device: Secondary | ICD-10-CM

## 2013-03-01 DIAGNOSIS — N898 Other specified noninflammatory disorders of vagina: Secondary | ICD-10-CM

## 2013-03-01 HISTORY — DX: Candidiasis, unspecified: B37.9

## 2013-03-01 HISTORY — DX: Presence of (intrauterine) contraceptive device: Z97.5

## 2013-03-01 LAB — POCT WET PREP (WET MOUNT)

## 2013-03-01 LAB — POCT URINALYSIS DIPSTICK
Ketones, UA: NEGATIVE
Protein, UA: NEGATIVE
Urobilinogen, UA: 0.2
pH, UA: 7

## 2013-03-01 MED ORDER — FLUCONAZOLE 150 MG PO TABS: ORAL_TABLET | ORAL | Status: DC

## 2013-03-01 NOTE — Progress Notes (Signed)
Subjective:     Patient ID: [name], female   DOB: 1986/06/22, 27 y.o.   MRN: [id number]  HPI [name] is a 27 year old black female in complaining of a vaginal discharge and itch x 4 days. No new sex partners.  Review of Systems Patient denies any headaches, blurred vision, shortness of breath, chest pain, abdominal pain, problems with bowel movements, urination, or intercourse. + complaints in HPI. Reviewed past medical,surgical, social and family history. Reviewed medications and allergies.      Objective:   Physical Exam Blood pressure 136/82, height 5\' 7"  (1.702 m), weight 279 lb (126.554 kg), last menstrual period 12/08/2012, not currently breastfeeding. skin warm and dry. Pelvic: external genitalia normal in appearance, vagina: white grainy discharge and vaginal side wall irritation, cervix slightly bulbous with IUD strings visible, negative CMT, uterus felt to be NSSC, non tender, no masses felt, no adnexal masses or tenderness noted. Wet prep: positive for yeast, no clue cells.    Assessment:      Vaginal discharge  Yeast infection    Plan:    Take diflucan No sex for a few days Check GC/CHL Follow up labs by phone in 3 days

## 2013-03-01 NOTE — Patient Instructions (Addendum)
No sex for 3 days Take diflucan Follow up in 3 days on results Sign up for my chart

## 2013-03-02 LAB — GC/CHLAMYDIA PROBE AMP: GC Probe RNA: NEGATIVE

## 2013-03-04 ENCOUNTER — Telehealth: Payer: Self-pay | Admitting: Adult Health

## 2013-03-04 NOTE — Telephone Encounter (Signed)
Called pt. both GC/CHL were negative, she is glad.

## 2013-03-14 ENCOUNTER — Encounter (HOSPITAL_COMMUNITY): Payer: Self-pay | Admitting: Pharmacy Technician

## 2013-03-14 NOTE — H&P (Signed)
  NTS SOAP Note  Vital Signs:  Vitals as of: 03/14/2013: Systolic 159: Diastolic 102: Heart Rate 67: Temp 96.48F: Height 32ft 7in: Weight 281Lbs 0 Ounces: Pain Level 6: BMI 44.01  BMI : 44.01 kg/m2  Subjective: This 27 Years 27 Months old Female presents for of    ABDOMINAL ISSUES: ,Was diagnosed with cholelithiasis last year, but became pregnant.  Has since delivered and is now having increasing nausea, right upper quadrant abdominal pain, and fatty food intolerance.  No fever, chills, jaundice. U/S of gallbladder in past shows cholelithiasis with normal common bile duct.  Review of Symptoms:  Constitutional:  fatigue Head:unremarkable    Eyes:unremarkable   sinus problems Cardiovascular:  unremarkable   Respiratory:  dyspnea Gastrointestin    abdominal pain,nausea,vomiting,heartburn Genitourinary:    dysuria   back pain dry skin Hematolgic/Lymphatic:unremarkable       seasonal allergies   Past Medical History:    Reviewed   Past Medical History  Surgical History: c section, T and A Medical Problems: morbid obesity, HTN Allergies: nkda Medications: norvasc, lexapro, HCTZ, lisinopril   Social History:Reviewed  Social History  Preferred Language: English Race:  Black or African American Ethnicity: Not Hispanic / Latino Age: 27 Years 6 Months Marital Status:  S Alcohol: socially Recreational drug(s):  No   Smoking Status: Light tobacoo smoker reviewed on 03/14/2013 Started Date: 11/07/2004 Packs per week: 2.00 Functional Status reviewed on mm/dd/yyyy ------------------------------------------------ Bathing: Normal Cooking: Normal Dressing: Normal Driving: Normal Eating: Normal Managing Meds: Normal Oral Care: Normal Shopping: Normal Toileting: Normal Transferring: Normal Walking: Normal Cognitive Status reviewed on mm/dd/yyyy ------------------------------------------------ Attention: Normal Decision Making:  Normal Language: Normal Memory: Normal Motor: Normal Perception: Normal Problem Solving: Normal Visual and Spatial: Normal   Family History:  Reviewed   Family History  Is there a family history of:CAD, HTN, DM    Objective Information: General:  Well appearing, morbidly obese BF in no distress.   no scleral icterus Heart:  RRR, no murmur Lungs:    CTA bilaterally, no wheezes, rhonchi, rales.  Breathing unlabored. Abdomen:Soft, tender in right upper quadrant to deep palpation, ND, no HSM, no masses.  Assessment:Biliary colic, cholelithiasis  Diagnosis &amp; Procedure Smart Code   Plan:Scheduled for laparoscopic cholecystectomy on 03/22/13.   Patient Education:Alternative treatments to surgery were discussed with patient (and family).  Risks and benefits  of procedure including bleeding, infection, hepatobiliary injury, and the possibility of an open procedure were fully explained to the patient (and family) who gave informed consent. Patient/family questions were addressed.  Follow-up:Pending Surgery

## 2013-03-18 NOTE — Patient Instructions (Addendum)
[name]  03/18/2013   Your procedure is scheduled on:  03/22/2013  Report to [facility] at  800  AM.  Call this number if you have problems the morning of surgery: [phone]   Remember:   Do not eat food or drink liquids after midnight.   Take these medicines the morning of surgery with A SIP OF WATER: norvasc,flexaril,lexapro,hydrodiuril,lisinopril   Do not wear jewelry, make-up or nail polish.  Do not wear lotions, powders, or perfumes.   Do not shave 48 hours prior to surgery. Men may shave face and neck.  Do not bring valuables to the hospital.  Contacts, dentures or bridgework may not be worn into surgery.  Leave suitcase in the car. After surgery it may be brought to your room.  For patients admitted to the hospital, checkout time is 11:00 AM the day of discharge.   Patients discharged the day of surgery will not be allowed to drive  home.  Name and phone number of your driver: family  Special Instructions: Shower using CHG 2 nights before surgery and the night before surgery.  If you shower the day of surgery use CHG.  Use special wash - you have one bottle of CHG for all showers.  You should use approximately 1/3 of the bottle for each shower.   Please read over the following fact sheets that you were given: Pain Booklet, Coughing and Deep Breathing, MRSA Information, Surgical Site Infection Prevention, Anesthesia Post-op Instructions and Care and Recovery After Surgery Laparoscopic Cholecystectomy Laparoscopic cholecystectomy is surgery to remove the gallbladder. The gallbladder is located slightly to the right of center in the abdomen, behind the liver. It is a concentrating and storage sac for the bile produced in the liver. Bile aids in the digestion and absorption of fats. Gallbladder disease (cholecystitis) is an inflammation of your gallbladder. This condition is usually caused by a buildup of gallstones (cholelithiasis) in your gallbladder. Gallstones can  block the flow of bile, resulting in inflammation and pain. In severe cases, emergency surgery may be required. When emergency surgery is not required, you will have time to prepare for the procedure. Laparoscopic surgery is an alternative to open surgery. Laparoscopic surgery usually has a shorter recovery time. Your common bile duct may also need to be examined and explored. Your caregiver will discuss this with you if he or she feels this should be done. If stones are found in the common bile duct, they may be removed. LET YOUR CAREGIVER KNOW ABOUT:  Allergies to food or medicine.  Medicines taken, including vitamins, herbs, eyedrops, over-the-counter medicines, and creams.  Use of steroids (by mouth or creams).  Previous problems with anesthetics or numbing medicines.  History of bleeding problems or blood clots.  Previous surgery.  Other health problems, including diabetes and kidney problems.  Possibility of pregnancy, if this applies. RISKS AND COMPLICATIONS All surgery is associated with risks. Some problems that may occur following this procedure include:  Infection.  Damage to the common bile duct, nerves, arteries, veins, or other internal organs such as the stomach or intestines.  Bleeding.  A stone may remain in the common bile duct. BEFORE THE PROCEDURE  Do not take aspirin for 3 days prior to surgery or blood thinners for 1 week prior to surgery.  Do not eat or drink anything after midnight the night before surgery.  Let your caregiver know if you develop a cold or other infectious problem prior to surgery.  You should be present 60 minutes before the procedure or as directed. PROCEDURE  You will be given medicine that makes you sleep (general anesthetic). When you are asleep, your surgeon will make several small cuts (incisions) in your abdomen. One of these incisions is used to insert a small, lighted scope (laparoscope) into the abdomen. The laparoscope helps  the surgeon see into your abdomen. Carbon dioxide gas will be pumped into your abdomen. The gas allows more room for the surgeon to perform your surgery. Other operating instruments are inserted through the other incisions. Laparoscopic procedures may not be appropriate when:  There is major scarring from previous surgery.  The gallbladder is extremely inflamed.  There are bleeding disorders or unexpected cirrhosis of the liver.  A pregnancy is near term.  Other conditions make the laparoscopic procedure impossible. If your surgeon feels it is not safe to continue with a laparoscopic procedure, he or she will perform an open abdominal procedure. In this case, the surgeon will make an incision to open the abdomen. This gives the surgeon a larger view and field to work within. This may allow the surgeon to perform procedures that sometimes cannot be performed with a laparoscope alone. Open surgery has a longer recovery time. AFTER THE PROCEDURE  You will be taken to the recovery area where a nurse will watch and check your progress.  You may be allowed to go home the same day.  Do not resume physical activities until directed by your caregiver.  You may resume a normal diet and activities as directed. Document Released: 10/24/2005 Document Revised: 01/16/2012 Document Reviewed: 04/08/2011 [facility] Patient Information 2013 [place], [place]. PATIENT INSTRUCTIONS POST-ANESTHESIA  IMMEDIATELY FOLLOWING SURGERY:  Do not drive or operate machinery for the first twenty four hours after surgery.  Do not make any important decisions for twenty four hours after surgery or while taking narcotic pain medications or sedatives.  If you develop intractable nausea and vomiting or a severe headache please notify your doctor immediately.  FOLLOW-UP:  Please make an appointment with your surgeon as instructed. You do not need to follow up with anesthesia unless specifically instructed to do so.  WOUND CARE  INSTRUCTIONS (if applicable):  Keep a dry clean dressing on the anesthesia/puncture wound site if there is drainage.  Once the wound has quit draining you may leave it open to air.  Generally you should leave the bandage intact for twenty four hours unless there is drainage.  If the epidural site drains for more than 36-48 hours please call the anesthesia department.  QUESTIONS?:  Please feel free to call your physician or the hospital operator if you have any questions, and they will be happy to assist you.

## 2013-03-19 ENCOUNTER — Encounter (HOSPITAL_COMMUNITY)
Admission: RE | Admit: 2013-03-19 | Discharge: 2013-03-19 | Disposition: A | Discharge status: Home / Self Care | Payer: Medicaid Other | Source: Ambulatory Visit

## 2013-03-28 ENCOUNTER — Encounter (HOSPITAL_COMMUNITY)
Admission: RE | Admit: 2013-03-28 | Discharge: 2013-03-28 | Disposition: A | Discharge status: Home / Self Care | Payer: Medicaid Other | Source: Ambulatory Visit | Attending: General Surgery | Admitting: General Surgery

## 2013-03-28 ENCOUNTER — Encounter (HOSPITAL_COMMUNITY): Payer: Self-pay

## 2013-03-28 HISTORY — DX: Personal history of urinary calculi: Z87.442

## 2013-03-28 LAB — CBC WITH DIFFERENTIAL/PLATELET
Basophils Absolute: 0 10*3/uL (ref 0.0–0.1)
Basophils Relative: 0 % (ref 0–1)
Eosinophils Absolute: 0.1 10*3/uL (ref 0.0–0.7)
Eosinophils Relative: 1 % (ref 0–5)
MCH: 29.4 pg (ref 26.0–34.0)
MCHC: 33.8 g/dL (ref 30.0–36.0)
MCV: 86.8 fL (ref 78.0–100.0)
Neutrophils Relative %: 35 % — ABNORMAL LOW (ref 43–77)
Platelets: 275 10*3/uL (ref 150–400)
RDW: 12.6 % (ref 11.5–15.5)

## 2013-03-28 LAB — SURGICAL PCR SCREEN: MRSA, PCR: NEGATIVE

## 2013-03-28 LAB — BASIC METABOLIC PANEL
CO2: 27 mEq/L (ref 19–32)
Calcium: 9.6 mg/dL (ref 8.4–10.5)
Creatinine, Ser: 0.6 mg/dL (ref 0.50–1.10)
GFR calc non Af Amer: >90 mL/min (ref >90)
Glucose, Bld: 87 mg/dL (ref 70–99)

## 2013-03-28 LAB — HEPATIC FUNCTION PANEL
ALT: 12 U/L (ref 0–35)
Albumin: 4 g/dL (ref 3.5–5.2)
Alkaline Phosphatase: 122 U/L — ABNORMAL HIGH (ref 39–117)
Total Bilirubin: 0.3 mg/dL (ref 0.3–1.2)
Total Protein: 7.2 g/dL (ref 6.0–8.3)

## 2013-03-28 MED ORDER — CHLORHEXIDINE GLUCONATE 4 % EX LIQD: 1.0000 "application " | Freq: Once | CUTANEOUS | Status: DC

## 2013-03-28 NOTE — Patient Instructions (Addendum)
[name]  03/28/2013   Your procedure is scheduled on:   04/05/2013   Report to [facility] at  615  AM.  Call this number if you have problems the morning of surgery: [phone]   Remember:   Do not eat food or drink liquids after midnight.   Take these medicines the morning of surgery with A SIP OF WATER: norvasc,flexaril,lexapro,hydrodiuril,lisinopril   Do not wear jewelry, make-up or nail polish.  Do not wear lotions, powders, or perfumes.   Do not shave 48 hours prior to surgery. Men may shave face and neck.  Do not bring valuables to the hospital.  Contacts, dentures or bridgework may not be worn into surgery.  Leave suitcase in the car. After surgery it may be brought to your room.  For patients admitted to the hospital, checkout time is 11:00 AM the day of discharge.   Patients discharged the day of surgery will not be allowed to drive  home.  Name and phone number of your driver: family  Special Instructions: Shower using CHG 2 nights before surgery and the night before surgery.  If you shower the day of surgery use CHG.  Use special wash - you have one bottle of CHG for all showers.  You should use approximately 1/3 of the bottle for each shower.   Please read over the following fact sheets that you were given: Pain Booklet, Coughing and Deep Breathing, MRSA Information, Surgical Site Infection Prevention, Anesthesia Post-op Instructions and Care and Recovery After Surgery Laparoscopic Cholecystectomy Laparoscopic cholecystectomy is surgery to remove the gallbladder. The gallbladder is located slightly to the right of center in the abdomen, behind the liver. It is a concentrating and storage sac for the bile produced in the liver. Bile aids in the digestion and absorption of fats. Gallbladder disease (cholecystitis) is an inflammation of your gallbladder. This condition is usually caused by a buildup of gallstones (cholelithiasis) in your gallbladder. Gallstones can  block the flow of bile, resulting in inflammation and pain. In severe cases, emergency surgery may be required. When emergency surgery is not required, you will have time to prepare for the procedure. Laparoscopic surgery is an alternative to open surgery. Laparoscopic surgery usually has a shorter recovery time. Your common bile duct may also need to be examined and explored. Your caregiver will discuss this with you if he or she feels this should be done. If stones are found in the common bile duct, they may be removed. LET YOUR CAREGIVER KNOW ABOUT:  Allergies to food or medicine.  Medicines taken, including vitamins, herbs, eyedrops, over-the-counter medicines, and creams.  Use of steroids (by mouth or creams).  Previous problems with anesthetics or numbing medicines.  History of bleeding problems or blood clots.  Previous surgery.  Other health problems, including diabetes and kidney problems.  Possibility of pregnancy, if this applies. RISKS AND COMPLICATIONS All surgery is associated with risks. Some problems that may occur following this procedure include:  Infection.  Damage to the common bile duct, nerves, arteries, veins, or other internal organs such as the stomach or intestines.  Bleeding.  A stone may remain in the common bile duct. BEFORE THE PROCEDURE  Do not take aspirin for 3 days prior to surgery or blood thinners for 1 week prior to surgery.  Do not eat or drink anything after midnight the night before surgery.  Let your caregiver know if you develop a cold or other infectious problem prior to surgery.  You should be present 60 minutes before the procedure or as directed. PROCEDURE  You will be given medicine that makes you sleep (general anesthetic). When you are asleep, your surgeon will make several small cuts (incisions) in your abdomen. One of these incisions is used to insert a small, lighted scope (laparoscope) into the abdomen. The laparoscope helps  the surgeon see into your abdomen. Carbon dioxide gas will be pumped into your abdomen. The gas allows more room for the surgeon to perform your surgery. Other operating instruments are inserted through the other incisions. Laparoscopic procedures may not be appropriate when:  There is major scarring from previous surgery.  The gallbladder is extremely inflamed.  There are bleeding disorders or unexpected cirrhosis of the liver.  A pregnancy is near term.  Other conditions make the laparoscopic procedure impossible. If your surgeon feels it is not safe to continue with a laparoscopic procedure, he or she will perform an open abdominal procedure. In this case, the surgeon will make an incision to open the abdomen. This gives the surgeon a larger view and field to work within. This may allow the surgeon to perform procedures that sometimes cannot be performed with a laparoscope alone. Open surgery has a longer recovery time. AFTER THE PROCEDURE  You will be taken to the recovery area where a nurse will watch and check your progress.  You may be allowed to go home the same day.  Do not resume physical activities until directed by your caregiver.  You may resume a normal diet and activities as directed. Document Released: 10/24/2005 Document Revised: 01/16/2012 Document Reviewed: 04/08/2011 [facility] Patient Information 2014 [place], [place]. PATIENT INSTRUCTIONS POST-ANESTHESIA  IMMEDIATELY FOLLOWING SURGERY:  Do not drive or operate machinery for the first twenty four hours after surgery.  Do not make any important decisions for twenty four hours after surgery or while taking narcotic pain medications or sedatives.  If you develop intractable nausea and vomiting or a severe headache please notify your doctor immediately.  FOLLOW-UP:  Please make an appointment with your surgeon as instructed. You do not need to follow up with anesthesia unless specifically instructed to do so.  WOUND CARE  INSTRUCTIONS (if applicable):  Keep a dry clean dressing on the anesthesia/puncture wound site if there is drainage.  Once the wound has quit draining you may leave it open to air.  Generally you should leave the bandage intact for twenty four hours unless there is drainage.  If the epidural site drains for more than 36-48 hours please call the anesthesia department.  QUESTIONS?:  Please feel free to call your physician or the hospital operator if you have any questions, and they will be happy to assist you.

## 2013-03-29 ENCOUNTER — Inpatient Hospital Stay (HOSPITAL_COMMUNITY): Admission: RE | Admit: 2013-03-29 | Payer: Self-pay | Source: Ambulatory Visit

## 2013-04-05 ENCOUNTER — Ambulatory Visit (HOSPITAL_COMMUNITY): Payer: Medicaid Other | Admitting: Anesthesiology

## 2013-04-05 ENCOUNTER — Encounter (HOSPITAL_COMMUNITY): Payer: Self-pay | Admitting: *Deleted

## 2013-04-05 ENCOUNTER — Ambulatory Visit (HOSPITAL_COMMUNITY)
Admission: RE | Admit: 2013-04-05 | Discharge: 2013-04-05 | Disposition: A | Discharge status: Home / Self Care | Payer: Medicaid Other | Source: Ambulatory Visit | Attending: General Surgery | Admitting: General Surgery

## 2013-04-05 ENCOUNTER — Encounter (HOSPITAL_COMMUNITY): Payer: Self-pay | Admitting: Anesthesiology

## 2013-04-05 ENCOUNTER — Encounter (HOSPITAL_COMMUNITY)
Admission: RE | Disposition: A | Discharge status: Home / Self Care | Payer: Self-pay | Source: Ambulatory Visit | Attending: General Surgery

## 2013-04-05 DIAGNOSIS — Z87891 Personal history of nicotine dependence: Secondary | ICD-10-CM | POA: Insufficient documentation

## 2013-04-05 DIAGNOSIS — I10 Essential (primary) hypertension: Secondary | ICD-10-CM | POA: Insufficient documentation

## 2013-04-05 DIAGNOSIS — K8010 Calculus of gallbladder with chronic cholecystitis without obstruction: Secondary | ICD-10-CM | POA: Insufficient documentation

## 2013-04-05 DIAGNOSIS — Z79899 Other long term (current) drug therapy: Secondary | ICD-10-CM | POA: Insufficient documentation

## 2013-04-05 DIAGNOSIS — K219 Gastro-esophageal reflux disease without esophagitis: Secondary | ICD-10-CM | POA: Insufficient documentation

## 2013-04-05 DIAGNOSIS — E282 Polycystic ovarian syndrome: Secondary | ICD-10-CM | POA: Insufficient documentation

## 2013-04-05 DIAGNOSIS — Z6841 Body Mass Index (BMI) 40.0 and over, adult: Secondary | ICD-10-CM | POA: Insufficient documentation

## 2013-04-05 DIAGNOSIS — G4730 Sleep apnea, unspecified: Secondary | ICD-10-CM | POA: Insufficient documentation

## 2013-04-05 HISTORY — PX: CHOLECYSTECTOMY: SHX55

## 2013-04-05 SURGERY — LAPAROSCOPIC CHOLECYSTECTOMY
Anesthesia: General | Site: Abdomen | Wound class: Clean Contaminated

## 2013-04-05 MED ORDER — ONDANSETRON HCL 4 MG/2ML IJ SOLN: 4.0000 mg | Freq: Once | INTRAMUSCULAR | Status: DC | PRN

## 2013-04-05 MED ORDER — OXYCODONE-ACETAMINOPHEN 5-325 MG PO TABS
1.0000 | ORAL_TABLET | Freq: Once | ORAL | Status: AC
  Administered 2013-04-05: 1 via ORAL

## 2013-04-05 MED ORDER — KETOROLAC TROMETHAMINE 30 MG/ML IJ SOLN
30.0000 mg | Freq: Once | INTRAMUSCULAR | Status: AC
  Administered 2013-04-05: 30 mg via INTRAVENOUS

## 2013-04-05 MED ORDER — FENTANYL CITRATE 0.05 MG/ML IJ SOLN
INTRAMUSCULAR | Status: AC
  Filled 2013-04-05: qty 2

## 2013-04-05 MED ORDER — MIDAZOLAM HCL 2 MG/2ML IJ SOLN
INTRAMUSCULAR | Status: AC
  Filled 2013-04-05: qty 2

## 2013-04-05 MED ORDER — OXYCODONE-ACETAMINOPHEN 7.5-325 MG PO TABS: 1.0000 | ORAL_TABLET | ORAL | Status: AC | PRN

## 2013-04-05 MED ORDER — ROCURONIUM BROMIDE 50 MG/5ML IV SOLN
INTRAVENOUS | Status: AC
  Filled 2013-04-05: qty 1

## 2013-04-05 MED ORDER — PROPOFOL 10 MG/ML IV BOLUS
INTRAVENOUS | Status: DC | PRN
  Administered 2013-04-05: 180 mg via INTRAVENOUS

## 2013-04-05 MED ORDER — FENTANYL CITRATE 0.05 MG/ML IJ SOLN
INTRAMUSCULAR | Status: AC
  Filled 2013-04-05: qty 5

## 2013-04-05 MED ORDER — GLYCOPYRROLATE 0.2 MG/ML IJ SOLN
INTRAMUSCULAR | Status: AC
  Filled 2013-04-05: qty 1

## 2013-04-05 MED ORDER — FENTANYL CITRATE 0.05 MG/ML IJ SOLN
25.0000 ug | INTRAMUSCULAR | Status: DC | PRN
Start: 2013-04-05 — End: 2013-04-05
  Administered 2013-04-05 (×3): 50 ug via INTRAVENOUS

## 2013-04-05 MED ORDER — KETOROLAC TROMETHAMINE 30 MG/ML IJ SOLN
INTRAMUSCULAR | Status: AC
  Filled 2013-04-05: qty 1

## 2013-04-05 MED ORDER — ENOXAPARIN SODIUM 40 MG/0.4ML ~~LOC~~ SOLN
SUBCUTANEOUS | Status: AC
  Filled 2013-04-05: qty 0.4

## 2013-04-05 MED ORDER — SUCCINYLCHOLINE CHLORIDE 20 MG/ML IJ SOLN
INTRAMUSCULAR | Status: DC | PRN
  Administered 2013-04-05: 120 mg via INTRAVENOUS

## 2013-04-05 MED ORDER — NEOSTIGMINE METHYLSULFATE 1 MG/ML IJ SOLN
INTRAMUSCULAR | Status: DC | PRN
  Administered 2013-04-05: 4 mg via INTRAVENOUS

## 2013-04-05 MED ORDER — BUPIVACAINE HCL (PF) 0.5 % IJ SOLN
INTRAMUSCULAR | Status: AC
  Filled 2013-04-05: qty 30

## 2013-04-05 MED ORDER — LACTATED RINGERS IV SOLN
INTRAVENOUS | Status: DC | PRN
  Administered 2013-04-05 (×2): via INTRAVENOUS

## 2013-04-05 MED ORDER — ARTIFICIAL TEARS OP OINT
TOPICAL_OINTMENT | OPHTHALMIC | Status: AC
  Filled 2013-04-05: qty 3.5

## 2013-04-05 MED ORDER — BUPIVACAINE HCL (PF) 0.5 % IJ SOLN
INTRAMUSCULAR | Status: DC | PRN
  Administered 2013-04-05: 10 mL

## 2013-04-05 MED ORDER — LIDOCAINE HCL (PF) 1 % IJ SOLN
INTRAMUSCULAR | Status: AC
  Filled 2013-04-05: qty 5

## 2013-04-05 MED ORDER — GLYCOPYRROLATE 0.2 MG/ML IJ SOLN
INTRAMUSCULAR | Status: DC | PRN
  Administered 2013-04-05: 0.6 mg via INTRAVENOUS

## 2013-04-05 MED ORDER — LIDOCAINE HCL (CARDIAC) 20 MG/ML IV SOLN
INTRAVENOUS | Status: DC | PRN
  Administered 2013-04-05: 50 mg via INTRAVENOUS

## 2013-04-05 MED ORDER — ROCURONIUM BROMIDE 100 MG/10ML IV SOLN
INTRAVENOUS | Status: DC | PRN
  Administered 2013-04-05: 35 mg via INTRAVENOUS
  Administered 2013-04-05: 10 mg via INTRAVENOUS

## 2013-04-05 MED ORDER — OXYCODONE-ACETAMINOPHEN 5-325 MG PO TABS
ORAL_TABLET | ORAL | Status: AC
  Filled 2013-04-05: qty 1

## 2013-04-05 MED ORDER — ONDANSETRON HCL 4 MG/2ML IJ SOLN
INTRAMUSCULAR | Status: AC
  Filled 2013-04-05: qty 2

## 2013-04-05 MED ORDER — FENTANYL CITRATE 0.05 MG/ML IJ SOLN
INTRAMUSCULAR | Status: DC | PRN
  Administered 2013-04-05 (×2): 50 ug via INTRAVENOUS
  Administered 2013-04-05: 150 ug via INTRAVENOUS
  Administered 2013-04-05 (×2): 50 ug via INTRAVENOUS

## 2013-04-05 MED ORDER — DEXTROSE 5 % IV SOLN
3.0000 g | INTRAVENOUS | Status: AC
  Administered 2013-04-05: 3 g via INTRAVENOUS
  Filled 2013-04-05: qty 3000

## 2013-04-05 MED ORDER — MIDAZOLAM HCL 5 MG/5ML IJ SOLN
INTRAMUSCULAR | Status: DC | PRN
  Administered 2013-04-05: 2 mg via INTRAVENOUS

## 2013-04-05 MED ORDER — LACTATED RINGERS IV SOLN
INTRAVENOUS | Status: DC
  Administered 2013-04-05: 07:00:00 via INTRAVENOUS

## 2013-04-05 MED ORDER — ONDANSETRON HCL 4 MG/2ML IJ SOLN
4.0000 mg | Freq: Once | INTRAMUSCULAR | Status: AC
  Administered 2013-04-05: 4 mg via INTRAVENOUS

## 2013-04-05 MED ORDER — ENOXAPARIN SODIUM 40 MG/0.4ML ~~LOC~~ SOLN
40.0000 mg | Freq: Once | SUBCUTANEOUS | Status: AC
  Administered 2013-04-05: 40 mg via SUBCUTANEOUS

## 2013-04-05 MED ORDER — SODIUM CHLORIDE 0.9 % IR SOLN
Status: DC | PRN
  Administered 2013-04-05: 3000 mL

## 2013-04-05 MED ORDER — SODIUM CHLORIDE 0.9 % IR SOLN
Status: DC | PRN
  Administered 2013-04-05: 1000 mL

## 2013-04-05 MED ORDER — HEMOSTATIC AGENTS (NO CHARGE) OPTIME
TOPICAL | Status: DC | PRN
  Administered 2013-04-05: 1 via TOPICAL

## 2013-04-05 MED ORDER — MIDAZOLAM HCL 2 MG/2ML IJ SOLN
1.0000 mg | INTRAMUSCULAR | Status: DC | PRN
  Administered 2013-04-05: 2 mg via INTRAVENOUS

## 2013-04-05 MED ORDER — DIPHENHYDRAMINE HCL 50 MG/ML IJ SOLN
INTRAMUSCULAR | Status: AC
  Filled 2013-04-05: qty 1

## 2013-04-05 MED ORDER — SUCCINYLCHOLINE CHLORIDE 20 MG/ML IJ SOLN
INTRAMUSCULAR | Status: AC
  Filled 2013-04-05: qty 1

## 2013-04-05 MED ORDER — CEFAZOLIN SODIUM-DEXTROSE 2-3 GM-% IV SOLR
INTRAVENOUS | Status: AC
  Filled 2013-04-05: qty 50

## 2013-04-05 MED ORDER — PROPOFOL 10 MG/ML IV EMUL
INTRAVENOUS | Status: AC
  Filled 2013-04-05: qty 20

## 2013-04-05 MED ORDER — DIPHENHYDRAMINE HCL 50 MG/ML IJ SOLN
25.0000 mg | Freq: Once | INTRAMUSCULAR | Status: AC
  Administered 2013-04-05: 25 mg via INTRAVENOUS

## 2013-04-05 MED ORDER — GLYCOPYRROLATE 0.2 MG/ML IJ SOLN
0.2000 mg | Freq: Once | INTRAMUSCULAR | Status: AC
Start: 2013-04-05 — End: 2013-04-05
  Administered 2013-04-05: 0.2 mg via INTRAVENOUS

## 2013-04-05 MED ORDER — CEFAZOLIN SODIUM 1-5 GM-% IV SOLN
INTRAVENOUS | Status: AC
  Filled 2013-04-05: qty 50

## 2013-04-05 MED ORDER — GLYCOPYRROLATE 0.2 MG/ML IJ SOLN
INTRAMUSCULAR | Status: AC
  Filled 2013-04-05: qty 3

## 2013-04-05 SURGICAL SUPPLY — 30 items
APPLIER CLIP LAPSCP 10X32 DD (CLIP) ×2 IMPLANT
BAG HAMPER (MISCELLANEOUS) ×2 IMPLANT
BAG SPEC RTRVL LRG 6X4 10 (ENDOMECHANICALS) ×1
CLOTH BEACON ORANGE TIMEOUT ST (SAFETY) ×2 IMPLANT
COVER LIGHT HANDLE STERIS (MISCELLANEOUS) ×4 IMPLANT
DECANTER SPIKE VIAL GLASS SM (MISCELLANEOUS) ×2 IMPLANT
DURAPREP 26ML APPLICATOR (WOUND CARE) ×2 IMPLANT
ELECT REM PT RETURN 9FT ADLT (ELECTROSURGICAL) ×2
ELECTRODE REM PT RTRN 9FT ADLT (ELECTROSURGICAL) ×1 IMPLANT
FILTER SMOKE EVAC LAPAROSHD (FILTER) ×2 IMPLANT
FORMALIN 10 PREFIL 120ML (MISCELLANEOUS) ×2 IMPLANT
GLOVE BIO SURGEON STRL SZ7.5 (GLOVE) ×2 IMPLANT
GOWN STRL REIN XL XLG (GOWN DISPOSABLE) ×6 IMPLANT
HEMOSTAT SNOW SURGICEL 2X4 (HEMOSTASIS) ×2 IMPLANT
INST SET LAPROSCOPIC AP (KITS) ×2 IMPLANT
IV NS IRRIG 3000ML ARTHROMATIC (IV SOLUTION) ×1 IMPLANT
KIT ROOM TURNOVER APOR (KITS) ×2 IMPLANT
KIT TROCAR LAP CHOLE (TROCAR) ×2 IMPLANT
MANIFOLD NEPTUNE II (INSTRUMENTS) ×2 IMPLANT
NS IRRIG 1000ML POUR BTL (IV SOLUTION) ×2 IMPLANT
PACK LAP CHOLE LZT030E (CUSTOM PROCEDURE TRAY) ×2 IMPLANT
PAD ARMBOARD 7.5X6 YLW CONV (MISCELLANEOUS) ×2 IMPLANT
POUCH SPECIMEN RETRIEVAL 10MM (ENDOMECHANICALS) ×2 IMPLANT
SET BASIN LINEN APH (SET/KITS/TRAYS/PACK) ×2 IMPLANT
SET TUBE IRRIG SUCTION NO TIP (IRRIGATION / IRRIGATOR) ×1 IMPLANT
SPONGE GAUZE 2X2 8PLY STRL LF (GAUZE/BANDAGES/DRESSINGS) ×8 IMPLANT
STAPLER VISISTAT (STAPLE) ×2 IMPLANT
SUT VICRYL 0 UR6 27IN ABS (SUTURE) ×2 IMPLANT
WARMER LAPAROSCOPE (MISCELLANEOUS) ×2 IMPLANT
YANKAUER SUCT 12FT TUBE ARGYLE (SUCTIONS) ×2 IMPLANT

## 2013-04-05 NOTE — Transfer of Care (Signed)
Immediate Anesthesia Transfer of Care Note  Patient: [name]  Procedure(s) Performed: Procedure(s): LAPAROSCOPIC CHOLECYSTECTOMY (N/A)  Patient Location: PACU  Anesthesia Type:General  Level of Consciousness: sedated and patient cooperative  Airway & Oxygen Therapy: Patient Spontanous Breathing and Patient connected to face mask oxygen  Post-op Assessment: Report given to PACU RN and Post -op Vital signs reviewed and stable  Post vital signs: Reviewed and stable  Complications: No apparent anesthesia complications

## 2013-04-05 NOTE — Anesthesia Postprocedure Evaluation (Signed)
  Anesthesia Post-op Note  Patient: [name]  Procedure(s) Performed: Procedure(s): LAPAROSCOPIC CHOLECYSTECTOMY (N/A)  Patient Location: PACU  Anesthesia Type:General  Level of Consciousness: sedated and patient cooperative  Airway and Oxygen Therapy: Patient Spontanous Breathing and Patient connected to face mask oxygen  Post-op Pain: mild  Post-op Assessment: Post-op Vital signs reviewed, Patient's Cardiovascular Status Stable, Respiratory Function Stable, Patent Airway and No signs of Nausea or vomiting  Post-op Vital Signs: Reviewed and stable  Complications: No apparent anesthesia complications

## 2013-04-05 NOTE — Interval H&P Note (Signed)
History and Physical Interval Note:  04/05/2013 7:32 AM  [name]  has presented today for surgery, with the diagnosis of cholelithiasis  The various methods of treatment have been discussed with the patient and family. After consideration of risks, benefits and other options for treatment, the patient has consented to  Procedure(s): LAPAROSCOPIC CHOLECYSTECTOMY (N/A) as a surgical intervention .  The patient's history has been reviewed, patient examined, no change in status, stable for surgery.  I have reviewed the patient's chart and labs.  Questions were answered to the patient's satisfaction.     Franky Macho A

## 2013-04-05 NOTE — Anesthesia Preprocedure Evaluation (Signed)
Anesthesia Evaluation  Patient identified by MRN, date of birth, ID band Patient awake    Reviewed: Allergy & Precautions, H&P , NPO status , Patient's Chart, lab work & pertinent test results  Airway Mallampati: III TM Distance: >3 FB Neck ROM: Full    Dental  (+) Teeth Intact   Pulmonary sleep apnea and Continuous Positive Airway Pressure Ventilation , former smoker,  breath sounds clear to auscultation        Cardiovascular hypertension, Pt. on medications Rhythm:Regular     Neuro/Psych  Headaches,    GI/Hepatic GERD-  Controlled,  Endo/Other  Morbid obesityPolycystic ovary syndrome  Renal/GU      Musculoskeletal   Abdominal (+) + obese,   Peds  Hematology   Anesthesia Other Findings   Reproductive/Obstetrics                           Anesthesia Physical Anesthesia Plan  ASA: III  Anesthesia Plan: General   Post-op Pain Management:    Induction: Intravenous, Rapid sequence and Cricoid pressure planned  Airway Management Planned: Oral ETT  Additional Equipment:   Intra-op Plan:   Post-operative Plan: Extubation in OR  Informed Consent: I have reviewed the patients History and Physical, chart, labs and discussed the procedure including the risks, benefits and alternatives for the proposed anesthesia with the patient or authorized representative who has indicated his/her understanding and acceptance.     Plan Discussed with:   Anesthesia Plan Comments:         Anesthesia Quick Evaluation

## 2013-04-05 NOTE — Anesthesia Procedure Notes (Signed)
Procedure Name: Intubation Date/Time: 04/05/2013 7:48 AM Performed by: Carolyne Littles, AMY L Pre-anesthesia Checklist: Patient identified, Patient being monitored, Timeout performed, Emergency Drugs available and Suction available Patient Re-evaluated:Patient Re-evaluated prior to inductionOxygen Delivery Method: Circle System Utilized Preoxygenation: Pre-oxygenation with 100% oxygen Intubation Type: IV induction Ventilation: Mask ventilation without difficulty Laryngoscope Size: 3 and Miller Grade View: Grade I Tube type: Oral Tube size: 7.0 mm Number of attempts: 1 Airway Equipment and Method: stylet Placement Confirmation: ETT inserted through vocal cords under direct vision,  positive ETCO2 and breath sounds checked- equal and bilateral Secured at: 21 cm Tube secured with: Tape Dental Injury: Teeth and Oropharynx as per pre-operative assessment

## 2013-04-05 NOTE — Preoperative (Signed)
Beta Blockers   Reason not to administer Beta Blockers:Not Applicable 

## 2013-04-05 NOTE — Op Note (Signed)
Patient:  [name]  DOB:  [birth year]  MRN:  [id number]   Preop Diagnosis:  Biliary colic, cholelithiasis  Postop Diagnosis:  Same  Procedure:  Laparoscopic cholecystectomy  Surgeon:  Franky Macho, M.D.  Anes:  General endotracheal  Indications:  Patient is a 27 year old black female presents with biliary colic secondary to cholelithiasis. The risks and benefits of the procedure including bleeding, infection, hepatobiliary, and the possibility of an open procedure were fully explained to the patient, who gave informed consent.  Procedure note:  The patient was placed in the supine position. After induction of general endotracheal anesthesia, the abdomen was prepped and draped using usual sterile technique with DuraPrep. Surgical site confirmation was performed.  A supraumbilical incision was made down to the fascia. A Veress needle was introduced into the abdominal cavity and confirmation of placement was done using the saline drop test. The abdomen was then insufflated to 16 mm mercury pressure. An 11 mm trocar was introduced into the abdominal cavity under direct visualization without difficulty. The patient was placed in reverse Trendelenburg position and additional 11 mm trocar was placed the epigastric region and 5 mm trochars were placed in the right upper quadrant and right flank regions. The liver was inspected and noted within normal limits. The gallbladder was retracted in a dynamic fashion ordered to expose the triangle of Calot. The cystic duct was first identified. Its juncture to the infundibulum was fully identified. Endoclips were placed proximally distally on the cystic duct, and the cystic duct was divided. This was likewise done to the cystic artery. The gallbladder was then freed away from the gallbladder fossa using Bovie electrocautery. The gallbladder was delivered through the epigastric trocar site using an Endo Catch bag. The gallbladder fossa was inspected and  no abnormal bleeding or bile leakage was noted. Surgicel is placed the gallbladder fossa. All fluid and air were then evacuated from the abdominal cavity prior to removal of the trochars.  All wounds were irrigated with normal saline. All wounds were injected with 0.5% Sensorcaine. The supraumbilical fascia as well as epigastric fascia were reapproximated using 0 Vicryl interrupted sutures. All skin incisions were closed using staples. Betadine ointment and dressed a dressings were applied.  All tape and needle counts were correct at the end of the procedure. The patient was extubated and transferred to PACU in stable condition.  Complications:  None  EBL:  Minimal  Specimen:  Gallbladder

## 2013-04-05 NOTE — Discharge Instructions (Signed)
Laparoscopic Cholecystectomy °Care After °Refer to this sheet in the next few weeks. These instructions provide you with information on caring for yourself after your procedure. Your caregiver may also give you more specific instructions. Your treatment has been planned according to current medical practices, but problems sometimes occur. Call your caregiver if you have any problems or questions after your procedure. °HOME CARE INSTRUCTIONS  °· Change bandages (dressings) as directed by your caregiver. °· Keep the wound dry and clean. The wound may be washed gently with soap and water. Gently blot or dab the area dry. °· Do not take baths or use swimming pools or hot tubs for 10 days, or as instructed by your caregiver. °· Only take over-the-counter or prescription medicines for pain, discomfort, or fever as directed by your caregiver. °· Continue your normal diet as directed by your caregiver. °· Do not lift anything heavier than 25 pounds (11.5 kg), or as directed by your caregiver. °· Do not play contact sports for 1 week, or as directed by your caregiver. °SEEK MEDICAL CARE IF:  °· There is redness, swelling, or increasing pain in the wound. °· You notice yellowish-white fluid (pus) coming from the wound. °· There is drainage from the wound that lasts longer than 1 day. °· There is a bad smell coming from the wound or dressing. °· The surgical cut (incision) breaks open. °SEEK IMMEDIATE MEDICAL CARE IF:  °· You develop a rash. °· You have difficulty breathing. °· You develop chest pain. °· You develop any reaction or side effects to medicines given. °· You have a fever. °· You have increasing pain in the shoulders (shoulder strap areas). °· You have dizzy episodes or faint while standing. °· You develop severe abdominal pain. °· You feel sick to your stomach (nauseous) or throw up (vomit) and this lasts for more than 1 day. °MAKE SURE YOU:  °· Understand these instructions. °· Will watch your condition. °· Will  get help right away if you are not doing well or get worse. °Document Released: 10/24/2005 Document Revised: 01/16/2012 Document Reviewed: 04/08/2011 °[employer]® Patient Information ©2014 [employer]. ° °

## 2013-04-06 ENCOUNTER — Emergency Department (HOSPITAL_COMMUNITY)
Admission: EM | Admit: 2013-04-06 | Discharge: 2013-04-06 | Disposition: A | Discharge status: Home / Self Care | Payer: Medicaid Other | Attending: Emergency Medicine | Admitting: Emergency Medicine

## 2013-04-06 ENCOUNTER — Encounter (HOSPITAL_COMMUNITY): Payer: Self-pay | Admitting: *Deleted

## 2013-04-06 DIAGNOSIS — Z8719 Personal history of other diseases of the digestive system: Secondary | ICD-10-CM | POA: Insufficient documentation

## 2013-04-06 DIAGNOSIS — Z8709 Personal history of other diseases of the respiratory system: Secondary | ICD-10-CM | POA: Insufficient documentation

## 2013-04-06 DIAGNOSIS — Z87891 Personal history of nicotine dependence: Secondary | ICD-10-CM | POA: Insufficient documentation

## 2013-04-06 DIAGNOSIS — Z8619 Personal history of other infectious and parasitic diseases: Secondary | ICD-10-CM | POA: Insufficient documentation

## 2013-04-06 DIAGNOSIS — Z975 Presence of (intrauterine) contraceptive device: Secondary | ICD-10-CM | POA: Insufficient documentation

## 2013-04-06 DIAGNOSIS — R1310 Dysphagia, unspecified: Secondary | ICD-10-CM | POA: Insufficient documentation

## 2013-04-06 DIAGNOSIS — E669 Obesity, unspecified: Secondary | ICD-10-CM | POA: Insufficient documentation

## 2013-04-06 DIAGNOSIS — R10819 Abdominal tenderness, unspecified site: Secondary | ICD-10-CM | POA: Insufficient documentation

## 2013-04-06 DIAGNOSIS — J029 Acute pharyngitis, unspecified: Secondary | ICD-10-CM | POA: Insufficient documentation

## 2013-04-06 DIAGNOSIS — Z9889 Other specified postprocedural states: Secondary | ICD-10-CM | POA: Insufficient documentation

## 2013-04-06 DIAGNOSIS — Z8742 Personal history of other diseases of the female genital tract: Secondary | ICD-10-CM | POA: Insufficient documentation

## 2013-04-06 DIAGNOSIS — I10 Essential (primary) hypertension: Secondary | ICD-10-CM | POA: Insufficient documentation

## 2013-04-06 DIAGNOSIS — Z87442 Personal history of urinary calculi: Secondary | ICD-10-CM | POA: Insufficient documentation

## 2013-04-06 DIAGNOSIS — G4730 Sleep apnea, unspecified: Secondary | ICD-10-CM | POA: Insufficient documentation

## 2013-04-06 DIAGNOSIS — Z79899 Other long term (current) drug therapy: Secondary | ICD-10-CM | POA: Insufficient documentation

## 2013-04-06 DIAGNOSIS — L299 Pruritus, unspecified: Secondary | ICD-10-CM | POA: Insufficient documentation

## 2013-04-06 NOTE — ED Notes (Signed)
Pt presents to er with c/o elevated blood pressure,headache,  sore throat, itching all over, states that she was in daysurgery yesterday, had her gallbladder removed, started itching all over while here, was given benadryl while she was still at surgical center, denies taking any additional benadryl. States that her blood pressure was elevated yesterday but that she thought is was because she had not taken her blood pressure medication due to having surgery.

## 2013-04-06 NOTE — ED Provider Notes (Signed)
History    This chart was scribed for Vida Roller, MD by Leone Payor, ED Scribe. This patient was seen in room APA02/APA02 and the patient's care was started 11:39 AM.   CSN: [id number]  Arrival date & time 04/06/13  1059   First MD Initiated Contact with Patient 04/06/13 1130      Chief Complaint  Patient presents with  . Hypertension     HPI  HPI Comments: [name] is a 27 y.o. female who presents to the Emergency Department complaining of HTN starting yesterday has been mild to moderate elevation, persistent, not terribly improved with her blood pressure medications. Pt had her gallbladder removed yesterday and has had itching all over her body and sore throat after the procedure. States her BP was in the 170's systolic yesterday and is slowly coming down to 150's systolic today. She has not eaten anything today. She was given benedryl at the surgical center but has not taken any today. She denies taking pain medications today because she was concerned about her high BP.      Past Medical History  Diagnosis Date  . Hypertension   . Polycystic ovarian syndrome   . Bronchitis   . Abnormal vaginal bleeding   . Sleep apnea   . Trichomonas contact, treated   . Obese   . Headache(784.0)   . Gallstone   . IUD (intrauterine device) in place 03/01/2013  . Yeast infection 03/01/2013  . History of kidney stones     Past Surgical History  Procedure Laterality Date  . Tonsillectomy    . Cesarean section  09/24/2012    Procedure: CESAREAN SECTION;  Surgeon: Willodean Rosenthal, MD;  Location: [facility];  Service: Gynecology;  Laterality: N/A;  . Cholecystectomy      Family History  Problem Relation Age of Onset  . Diabetes Mother   . Asthma Brother   . Heart murmur Brother   . Diabetes Maternal Aunt   . Hypertension Maternal Aunt   . Diabetes Maternal Uncle   . Diabetes Maternal Grandmother   . Diabetes Maternal Grandfather   . Hypertension Maternal  Grandfather   . Stroke Maternal Grandfather     History  Substance Use Topics  . Smoking status: Former Smoker    Types: Cigarettes    Quit date: 01/17/2012  . Smokeless tobacco: Never Used     Comment: 1 pack of cigarettes weekly when she was smoking  . Alcohol Use: Yes     Comment: social drink-monthly    OB History   Grav Para Term Preterm Abortions TAB SAB Ect Mult Living   1 1 1  0 0 0 0 0 0 1      Review of Systems  All other systems reviewed and are negative.    Allergies  Review of patient's allergies indicates no known allergies.  Home Medications   Current Outpatient Rx  Name  Route  Sig  Dispense  Refill  . amLODipine (NORVASC) 10 MG tablet   Oral   Take 10 mg by mouth daily.         . cyclobenzaprine (FLEXERIL) 10 MG tablet   Oral   Take 10 mg by mouth every 6 (six) hours as needed. Takes for muscle spasms         . escitalopram (LEXAPRO) 20 MG tablet   Oral   Take 20 mg by mouth daily.         . hydrochlorothiazide (HYDRODIURIL) 25 MG tablet  Oral   Take 25 mg by mouth daily.         Marland Kitchen lisinopril (PRINIVIL,ZESTRIL) 10 MG tablet   Oral   Take 10 mg by mouth daily.         Marland Kitchen oxyCODONE-acetaminophen (PERCOCET) 7.5-325 MG per tablet   Oral   Take 1-2 tablets by mouth every 4 (four) hours as needed for pain.   50 tablet   0   . levonorgestrel (MIRENA) 20 MCG/24HR IUD   Intrauterine   1 each by Intrauterine route once.           BP 146/86  Pulse 98  Temp(Src) 97.5 F (36.4 C) (Oral)  Resp 18  SpO2 100%  LMP 11/07/2012  Physical Exam  Nursing note and vitals reviewed. Constitutional: She appears well-developed and well-nourished. No distress.  HENT:  Head: Normocephalic and atraumatic.  Mouth/Throat: Oropharynx is clear and moist. No oropharyngeal exudate.  Eyes: Conjunctivae and EOM are normal. Pupils are equal, round, and reactive to light. Right eye exhibits no discharge. Left eye exhibits no discharge. No scleral  icterus.  Neck: Normal range of motion. Neck supple. No JVD present. No thyromegaly present.  Cardiovascular: Normal rate, regular rhythm, normal heart sounds and intact distal pulses.  Exam reveals no gallop and no friction rub.   No murmur heard. Pulmonary/Chest: Effort normal and breath sounds normal. No respiratory distress. She has no wheezes. She has no rales.  Abdominal: Soft. Bowel sounds are normal. She exhibits no distension and no mass. There is no tenderness.  Mild mid and right upper quadrant abdominal tenderness, no guarding, no peritoneal signs  Musculoskeletal: Normal range of motion. She exhibits no edema and no tenderness.  Lymphadenopathy:    She has no cervical adenopathy.  Neurological: She is alert. Coordination normal.  Skin: Skin is warm and dry. No rash noted. No erythema.  No drainage from postop wounds  Psychiatric: She has a normal mood and affect. Her behavior is normal.    ED Course  Procedures (including critical care time)  DIAGNOSTIC STUDIES: Oxygen Saturation is 100% on room air, normal by my interpretation.    COORDINATION OF CARE: 11:39 AM Discussed treatment plan with pt at bedside and pt agreed to plan.   Labs Reviewed - No data to display No results found.   1. Hypertension       MDM  The patient has mild hypertension, she has no focal symptoms, she can be safely discharged without any further evaluation. She likely has ongoing mild hypertension from postanesthesia medications including Benadryl which he took in the hospital prior to being discharged. She has not had her medications yet today and has not taken any pain medications.   Meds given in ED:  Medications - No data to display  New Prescriptions   No medications on file      I personally performed the services described in this documentation, which was scribed in my presence. The recorded information has been reviewed and is accurate.        Vida Roller,  MD 04/06/13 [phone]

## 2013-04-08 ENCOUNTER — Encounter (HOSPITAL_COMMUNITY): Payer: Self-pay | Admitting: General Surgery

## 2013-05-16 ENCOUNTER — Other Ambulatory Visit: Payer: Medicaid Other | Admitting: Obstetrics & Gynecology

## 2013-06-18 ENCOUNTER — Ambulatory Visit: Payer: Medicaid Other | Admitting: Obstetrics & Gynecology

## 2013-07-04 ENCOUNTER — Other Ambulatory Visit: Payer: Self-pay | Admitting: Adult Health

## 2014-01-27 ENCOUNTER — Encounter (INDEPENDENT_AMBULATORY_CARE_PROVIDER_SITE_OTHER): Payer: Self-pay

## 2014-01-27 ENCOUNTER — Other Ambulatory Visit: Payer: Medicaid Other | Admitting: Obstetrics & Gynecology

## 2014-01-31 ENCOUNTER — Other Ambulatory Visit (HOSPITAL_COMMUNITY)
Admission: RE | Admit: 2014-01-31 | Discharge: 2014-01-31 | Disposition: A | Discharge status: Home / Self Care | Payer: Medicaid Other | Source: Ambulatory Visit | Attending: Obstetrics & Gynecology | Admitting: Obstetrics & Gynecology

## 2014-01-31 ENCOUNTER — Encounter: Payer: Self-pay | Admitting: Obstetrics & Gynecology

## 2014-01-31 ENCOUNTER — Ambulatory Visit (INDEPENDENT_AMBULATORY_CARE_PROVIDER_SITE_OTHER): Payer: Medicaid Other | Admitting: Obstetrics & Gynecology

## 2014-01-31 VITALS — BP 156/100 | Ht 67.0 in | Wt 287.0 lb

## 2014-01-31 DIAGNOSIS — Z01419 Encounter for gynecological examination (general) (routine) without abnormal findings: Secondary | ICD-10-CM | POA: Insufficient documentation

## 2014-01-31 DIAGNOSIS — Z113 Encounter for screening for infections with a predominantly sexual mode of transmission: Secondary | ICD-10-CM | POA: Insufficient documentation

## 2014-01-31 DIAGNOSIS — Z0000 Encounter for general adult medical examination without abnormal findings: Secondary | ICD-10-CM

## 2014-01-31 DIAGNOSIS — Z7251 High risk heterosexual behavior: Secondary | ICD-10-CM

## 2014-01-31 MED ORDER — ESCITALOPRAM OXALATE 20 MG PO TABS: 20.0000 mg | ORAL_TABLET | Freq: Every day | ORAL | Status: DC

## 2014-01-31 NOTE — Progress Notes (Signed)
Patient ID: [name], female   DOB: Jan 21, 1986, 28 y.o.   MRN: [id number] Subjective:     [name] is a 28 y.o. female here for a routine exam.  Patient's last menstrual period was 01/23/2014. G1P1001 Birth Control Method:  mirena Menstrual Calendar(currently): minimal spotting  Current complaints: none.   Current acute medical issues:  none   Recent Gynecologic History Patient's last menstrual period was 01/23/2014. Last Pap: 2014,  normal Last mammogram: ,    Past Medical History  Diagnosis Date  . Hypertension   . Polycystic ovarian syndrome   . Bronchitis   . Abnormal vaginal bleeding   . Sleep apnea   . Trichomonas contact, treated   . Obese   . Headache(784.0)   . Gallstone   . IUD (intrauterine device) in place 03/01/2013  . Yeast infection 03/01/2013  . History of kidney stones     Past Surgical History  Procedure Laterality Date  . Tonsillectomy    . Cesarean section  09/24/2012    Procedure: CESAREAN SECTION;  Surgeon: Willodean Rosenthal, MD;  Location: [facility];  Service: Gynecology;  Laterality: N/A;  . Cholecystectomy    . Cholecystectomy N/A 04/05/2013    Procedure: LAPAROSCOPIC CHOLECYSTECTOMY;  Surgeon: Dalia Heading, MD;  Location: [facility];  Service: General;  Laterality: N/A;    OB History   Grav Para Term Preterm Abortions TAB SAB Ect Mult Living   1 1 1  0 0 0 0 0 0 1      History   Social History  . Marital Status: Single    Spouse Name: N/A    Number of Children: N/A  . Years of Education: N/A   Social History Main Topics  . Smoking status: Former Smoker    Types: Cigarettes    Quit date: 01/17/2012  . Smokeless tobacco: Never Used     Comment: 1 pack of cigarettes weekly when she was smoking  . Alcohol Use: Yes     Comment: social drink-monthly  . Drug Use: No  . Sexual Activity: Yes    Birth Control/ Protection: IUD   Other Topics Concern  . None   Social History Narrative  . None    Family History   Problem Relation Age of Onset  . Diabetes Mother   . Asthma Brother   . Heart murmur Brother   . Diabetes Maternal Aunt   . Hypertension Maternal Aunt   . Diabetes Maternal Uncle   . Diabetes Maternal Grandmother   . Diabetes Maternal Grandfather   . Hypertension Maternal Grandfather   . Stroke Maternal Grandfather      Review of Systems  Review of Systems  Constitutional: Negative for fever, chills, weight loss, malaise/fatigue and diaphoresis.  HENT: Negative for hearing loss, ear pain, nosebleeds, congestion, sore throat, neck pain, tinnitus and ear discharge.   Eyes: Negative for blurred vision, double vision, photophobia, pain, discharge and redness.  Respiratory: Negative for cough, hemoptysis, sputum production, shortness of breath, wheezing and stridor.   Cardiovascular: Negative for chest pain, palpitations, orthopnea, claudication, leg swelling and PND.  Gastrointestinal: negative for abdominal pain. Negative for heartburn, nausea, vomiting, diarrhea, constipation, blood in stool and melena.  Genitourinary: Negative for dysuria, urgency, frequency, hematuria and flank pain.  Musculoskeletal: Negative for myalgias, back pain, joint pain and falls.  Skin: Negative for itching and rash.  Neurological: Negative for dizziness, tingling, tremors, sensory change, speech change, focal weakness, seizures, loss of consciousness, weakness and headaches.  Endo/Heme/Allergies:  Negative for environmental allergies and polydipsia. Does not bruise/bleed easily.  Psychiatric/Behavioral: Negative for depression, suicidal ideas, hallucinations, memory loss and substance abuse. The patient is not nervous/anxious and does not have insomnia.        Objective:    Physical Exam  Vitals reviewed. Constitutional: She is oriented to person, place, and time. She appears well-developed and well-nourished.  HENT:  Head: Normocephalic and atraumatic.        Right Ear: External ear normal.  Left  Ear: External ear normal.  Nose: Nose normal.  Mouth/Throat: Oropharynx is clear and moist.  Eyes: Conjunctivae and EOM are normal. Pupils are equal, round, and reactive to light. Right eye exhibits no discharge. Left eye exhibits no discharge. No scleral icterus.  Neck: Normal range of motion. Neck supple. No tracheal deviation present. No thyromegaly present.  Cardiovascular: Normal rate, regular rhythm, normal heart sounds and intact distal pulses.  Exam reveals no gallop and no friction rub.   No murmur heard. Respiratory: Effort normal and breath sounds normal. No respiratory distress. She has no wheezes. She has no rales. She exhibits no tenderness.  GI: Soft. Bowel sounds are normal. She exhibits no distension and no mass. There is no tenderness. There is no rebound and no guarding.  Genitourinary:  Breasts no masses skin changes or nipple changes bilaterally      Vulva is normal without lesions Vagina is pink moist without discharge Cervix normal in appearance and pap is done Uterus is normal size shape and contour Adnexa is negative with normal sized ovaries   Musculoskeletal: Normal range of motion. She exhibits no edema and no tenderness.  Neurological: She is alert and oriented to person, place, and time. She has normal reflexes. She displays normal reflexes. No cranial nerve deficit. She exhibits normal muscle tone. Coordination normal.  Skin: Skin is warm and dry. No rash noted. No erythema. No pallor.  Psychiatric: She has a normal mood and affect. Her behavior is normal. Judgment and thought content normal.       Assessment:    Healthy female exam.    Plan:    Contraception: IUD. Follow up in: 1 year.

## 2014-02-01 LAB — HIV ANTIBODY (ROUTINE TESTING W REFLEX): HIV: NONREACTIVE

## 2014-02-01 LAB — RPR

## 2014-02-03 LAB — HSV 2 ANTIBODY, IGG: HSV 2 Glycoprotein G Ab, IgG: <0.1 IV

## 2014-02-13 ENCOUNTER — Telehealth: Payer: Self-pay | Admitting: Obstetrics & Gynecology

## 2014-02-13 NOTE — Telephone Encounter (Signed)
Pt states saw Dr. Despina HiddenEure 01/31/2014 was restarted on Lexapro 20 mg continues to have anxiety and insomnia. Please advise.

## 2014-02-17 ENCOUNTER — Telehealth: Payer: Self-pay | Admitting: Obstetrics & Gynecology

## 2014-02-17 MED ORDER — ZOLPIDEM TARTRATE 10 MG PO TABS: 10.0000 mg | ORAL_TABLET | Freq: Every evening | ORAL | Status: DC | PRN

## 2014-02-17 NOTE — Telephone Encounter (Signed)
[facility] [place]

## 2014-07-04 ENCOUNTER — Ambulatory Visit: Payer: Medicaid Other | Admitting: Obstetrics & Gynecology

## 2014-07-09 ENCOUNTER — Encounter: Payer: Self-pay | Admitting: *Deleted

## 2014-07-09 ENCOUNTER — Ambulatory Visit: Payer: Medicaid Other | Admitting: Obstetrics & Gynecology

## 2014-08-05 ENCOUNTER — Telehealth: Payer: Self-pay | Admitting: Obstetrics & Gynecology

## 2014-08-05 ENCOUNTER — Encounter: Payer: Self-pay | Admitting: Obstetrics & Gynecology

## 2014-08-05 ENCOUNTER — Ambulatory Visit (INDEPENDENT_AMBULATORY_CARE_PROVIDER_SITE_OTHER): Payer: Medicaid Other | Admitting: Obstetrics & Gynecology

## 2014-08-05 VITALS — BP 128/80 | Ht 67.0 in | Wt 277.5 lb

## 2014-08-05 DIAGNOSIS — M545 Low back pain, unspecified: Secondary | ICD-10-CM

## 2014-08-05 MED ORDER — CYCLOBENZAPRINE HCL 10 MG PO TABS: 10.0000 mg | ORAL_TABLET | Freq: Four times a day (QID) | ORAL | Status: DC | PRN

## 2014-08-05 NOTE — Progress Notes (Signed)
Patient ID: Angel DolphinBrittany N Eschmann, female   DOB: 03/02/1986, 28 y.o.   MRN: [id number] Pt has right back spasm  20 cc trigger point injections is performed, right low back and around ischial spine rx flexeril

## 2014-08-05 NOTE — Telephone Encounter (Signed)
Pt states needs a note stating was seen in our office today and can return to work on 08/06/2014. Pt informed will leave note at front desk for pick up.

## 2014-09-08 ENCOUNTER — Encounter: Payer: Self-pay | Admitting: Obstetrics & Gynecology

## 2014-11-11 ENCOUNTER — Emergency Department (HOSPITAL_COMMUNITY)
Admission: EM | Admit: 2014-11-11 | Discharge: 2014-11-11 | Disposition: A | Discharge status: Home / Self Care | Payer: Medicaid Other | Attending: Emergency Medicine | Admitting: Emergency Medicine

## 2014-11-11 ENCOUNTER — Encounter (HOSPITAL_COMMUNITY): Payer: Self-pay | Admitting: *Deleted

## 2014-11-11 DIAGNOSIS — Z8719 Personal history of other diseases of the digestive system: Secondary | ICD-10-CM | POA: Insufficient documentation

## 2014-11-11 DIAGNOSIS — Z87891 Personal history of nicotine dependence: Secondary | ICD-10-CM | POA: Insufficient documentation

## 2014-11-11 DIAGNOSIS — Z975 Presence of (intrauterine) contraceptive device: Secondary | ICD-10-CM | POA: Insufficient documentation

## 2014-11-11 DIAGNOSIS — Z8669 Personal history of other diseases of the nervous system and sense organs: Secondary | ICD-10-CM | POA: Insufficient documentation

## 2014-11-11 DIAGNOSIS — Z87442 Personal history of urinary calculi: Secondary | ICD-10-CM | POA: Insufficient documentation

## 2014-11-11 DIAGNOSIS — I10 Essential (primary) hypertension: Secondary | ICD-10-CM | POA: Insufficient documentation

## 2014-11-11 DIAGNOSIS — Z87448 Personal history of other diseases of urinary system: Secondary | ICD-10-CM | POA: Insufficient documentation

## 2014-11-11 DIAGNOSIS — L089 Local infection of the skin and subcutaneous tissue, unspecified: Secondary | ICD-10-CM | POA: Insufficient documentation

## 2014-11-11 DIAGNOSIS — Z79899 Other long term (current) drug therapy: Secondary | ICD-10-CM | POA: Insufficient documentation

## 2014-11-11 DIAGNOSIS — Z8619 Personal history of other infectious and parasitic diseases: Secondary | ICD-10-CM | POA: Insufficient documentation

## 2014-11-11 DIAGNOSIS — E669 Obesity, unspecified: Secondary | ICD-10-CM | POA: Insufficient documentation

## 2014-11-11 MED ORDER — SULFAMETHOXAZOLE-TRIMETHOPRIM 800-160 MG PO TABS: 1.0000 | ORAL_TABLET | Freq: Two times a day (BID) | ORAL | Status: AC

## 2014-11-11 MED ORDER — OXYCODONE-ACETAMINOPHEN 5-325 MG PO TABS
1.0000 | ORAL_TABLET | Freq: Once | ORAL | Status: AC
  Administered 2014-11-11: 1 via ORAL
  Filled 2014-11-11: qty 1

## 2014-11-11 MED ORDER — OXYCODONE-ACETAMINOPHEN 5-325 MG PO TABS: 1.0000 | ORAL_TABLET | ORAL | Status: DC | PRN

## 2014-11-11 MED ORDER — SULFAMETHOXAZOLE-TRIMETHOPRIM 800-160 MG PO TABS
1.0000 | ORAL_TABLET | Freq: Once | ORAL | Status: AC
  Administered 2014-11-11: 1 via ORAL
  Filled 2014-11-11: qty 1

## 2014-11-11 NOTE — ED Provider Notes (Signed)
CSN: [id number]     Arrival date & time 11/11/14  0003 History   First MD Initiated Contact with Patient 11/11/14 0044     Chief Complaint  Patient presents with  . Foot Pain     (Consider location/radiation/quality/duration/timing/severity/associated sxs/prior Treatment) Patient is a 29 y.o. female presenting with lower extremity pain. The history is provided by the patient.  Foot Pain This is a new problem. The current episode started yesterday. The problem occurs constantly. The problem has been gradually worsening. Associated symptoms include a rash.   [name] is a 29 y.o. female who presents to the ED with right foot pain. She states that last night she felt a burning sensation in her foot and then when she looked at it she noted a few red areas. As the night progressed the pain got worse. Today she was wearing boots and noted that they felt tight on the right foot. When she tried to take the boot off she had a hard time due to the swelling in her foot. Now the foot is red, swollen and tender. There are red areas that she wonders if they are insect bites. She was outside yesterday with flat open shoes.  Patient with hx of HTN but is off her medications since she lost her insurance a month ago.   Past Medical History  Diagnosis Date  . Hypertension   . Polycystic ovarian syndrome   . Bronchitis   . Abnormal vaginal bleeding   . Sleep apnea   . Trichomonas contact, treated   . Obese   . Headache(784.0)   . Gallstone   . IUD (intrauterine device) in place 03/01/2013  . Yeast infection 03/01/2013  . History of kidney stones    Past Surgical History  Procedure Laterality Date  . Tonsillectomy    . Cesarean section  09/24/2012    Procedure: CESAREAN SECTION;  Surgeon: Willodean Rosenthal, MD;  Location: [facility];  Service: Gynecology;  Laterality: N/A;  . Cholecystectomy    . Cholecystectomy N/A 04/05/2013    Procedure: LAPAROSCOPIC CHOLECYSTECTOMY;  Surgeon: Dalia Heading, MD;  Location: [facility];  Service: General;  Laterality: N/A;   Family History  Problem Relation Age of Onset  . Diabetes Mother   . Asthma Brother   . Heart murmur Brother   . Diabetes Maternal Aunt   . Hypertension Maternal Aunt   . Diabetes Maternal Uncle   . Diabetes Maternal Grandmother   . Diabetes Maternal Grandfather   . Hypertension Maternal Grandfather   . Stroke Maternal Grandfather    History  Substance Use Topics  . Smoking status: Former Smoker    Types: Cigarettes    Quit date: 01/17/2012  . Smokeless tobacco: Never Used     Comment: 1 pack of cigarettes weekly when she was smoking  . Alcohol Use: Yes     Comment: social drink-monthly   OB History    Gravida Para Term Preterm AB TAB SAB Ectopic Multiple Living   0 0 0 0 0 0 1     Review of Systems  Musculoskeletal:       Right foot redness, swelling and pain.   Skin: Positive for rash.  all other systems negtive    Allergies  Review of patient's allergies indicates no known allergies.  Home Medications   Prior to Admission medications   Medication Sig Start Date End Date Taking? Authorizing Provider  levonorgestrel (MIRENA) 20 MCG/24HR IUD 1 each by Intrauterine  route once.   Yes Historical Provider, MD  amLODipine (NORVASC) 10 MG tablet Take 10 mg by mouth daily.    Historical Provider, MD  cyclobenzaprine (FLEXERIL) 10 MG tablet Take 1 tablet (10 mg total) by mouth every 6 (six) hours as needed. Takes for muscle spasms 08/05/14   Lazaro Arms, MD  escitalopram (LEXAPRO) 20 MG tablet Take 1 tablet (20 mg total) by mouth daily. 01/31/14   Lazaro Arms, MD  hydrochlorothiazide (HYDRODIURIL) 25 MG tablet Take 25 mg by mouth daily.    Historical Provider, MD  lisinopril (PRINIVIL,ZESTRIL) 10 MG tablet Take 10 mg by mouth daily.    Historical Provider, MD  oxyCODONE-acetaminophen (ROXICET) 5-325 MG per tablet Take 1-2 tablets by mouth every 4 (four) hours as needed. 11/11/14   Hope Orlene Och, NP    sulfamethoxazole-trimethoprim (BACTRIM DS,SEPTRA DS) 800-160 MG per tablet Take 1 tablet by mouth 2 (two) times daily. 11/11/14 11/18/14  Hope Orlene Och, NP  zolpidem (AMBIEN) 10 MG tablet Take 1 tablet (10 mg total) by mouth at bedtime as needed for sleep. 02/17/14 08/05/14  Lazaro Arms, MD   BP 139/98 mmHg  Pulse 93  Temp(Src) 98.4 F (36.9 C) (Oral)  Resp 20  Ht  (1.702 m)  Wt 267 lb (121.11 kg)  BMI 41.81 kg/m2  SpO2 100% Physical Exam  Constitutional: She is oriented to person, place, and time. She appears well-developed and well-nourished. No distress.  HENT:  Head: Normocephalic.  Eyes: Conjunctivae and EOM are normal.  Neck: Neck supple.  Cardiovascular: Normal rate.   Pulmonary/Chest: Effort normal.  Abdominal: Soft. There is no tenderness.  Musculoskeletal: Normal range of motion.       Right foot: There is tenderness and swelling. There is normal range of motion, normal capillary refill, no deformity and no laceration.       Feet:  There are red raised area to the dorsum of the right foot. The foot is swollen with erythema from the base of the toes to the ankle. Pedal pulse 2+, adequate circulation.   Neurological: She is alert and oriented to person, place, and time. No cranial nerve deficit.  Skin: Skin is warm and dry.  Psychiatric: She has a normal mood and affect. Her behavior is normal.  Nursing note and vitals reviewed.   ED Course  Procedures  Dr. Preston Fleeting in to examine the patient.   MDM  29 y.o. female with erythema, swelling and tenderness to the dorsum of the right foot. Stable for discharge without fever or neurovascular deficits. Will treat for pain and infection. She will follow up in 2 days for recheck or sooner for any problems.    Medication List    TAKE these medications        oxyCODONE-acetaminophen 5-325 MG per tablet  Commonly known as:  ROXICET  Take 1-2 tablets by mouth every 4 (four) hours as needed.     sulfamethoxazole-trimethoprim  800-160 MG per tablet  Commonly known as:  BACTRIM DS,SEPTRA DS  Take 1 tablet by mouth 2 (two) times daily.      ASK your doctor about these medications        amLODipine 10 MG tablet  Commonly known as:  NORVASC  Take 10 mg by mouth daily.     cyclobenzaprine 10 MG tablet  Commonly known as:  FLEXERIL  Take 1 tablet (10 mg total) by mouth every 6 (six) hours as needed. Takes for muscle spasms  escitalopram 20 MG tablet  Commonly known as:  LEXAPRO  Take 1 tablet (20 mg total) by mouth daily.     hydrochlorothiazide 25 MG tablet  Commonly known as:  HYDRODIURIL  Take 25 mg by mouth daily.     levonorgestrel 20 MCG/24HR IUD  Commonly known as:  MIRENA  1 each by Intrauterine route once.     lisinopril 10 MG tablet  Commonly known as:  PRINIVIL,ZESTRIL  Take 10 mg by mouth daily.     zolpidem 10 MG tablet  Commonly known as:  AMBIEN  Take 1 tablet (10 mg total) by mouth at bedtime as needed for sleep.        Final diagnoses:  Skin infection      Janne NapoleonHope M Neese, NP 11/11/14 0130  Dione Boozeavid Glick, MD 11/11/14 [phone]

## 2014-11-11 NOTE — ED Notes (Signed)
Pt c/o redness, swelling and pain to right foot; pt states it started off yesterday as a small red dot and has gotten progressively worse

## 2014-11-11 NOTE — Discharge Instructions (Signed)
We are treating you for pain and infection. You will need to return in 2 days for recheck and sooner for worsening symptoms.   YOUR BLOOD PRESSURE IS ELEVATED TONIGHT AND IT IS IMPORTANT THAT YOU FOLLOW UP WITH A PRIMARY CARE DOCTOR TO CONTINUE YOUR MEDICATIONS.

## 2014-11-12 ENCOUNTER — Emergency Department (HOSPITAL_COMMUNITY)
Admission: EM | Admit: 2014-11-12 | Discharge: 2014-11-12 | Discharge status: Against Medical Advice | Payer: Medicaid Other | Attending: Emergency Medicine | Admitting: Emergency Medicine

## 2014-11-12 ENCOUNTER — Encounter (HOSPITAL_COMMUNITY): Payer: Self-pay | Admitting: *Deleted

## 2014-11-12 DIAGNOSIS — I10 Essential (primary) hypertension: Secondary | ICD-10-CM | POA: Insufficient documentation

## 2014-11-12 DIAGNOSIS — E669 Obesity, unspecified: Secondary | ICD-10-CM | POA: Insufficient documentation

## 2014-11-12 DIAGNOSIS — Z4800 Encounter for change or removal of nonsurgical wound dressing: Secondary | ICD-10-CM | POA: Insufficient documentation

## 2014-11-12 NOTE — ED Notes (Signed)
Pt not in room when MD entered to assess.  Was informed by registration that pt left department.

## 2014-11-12 NOTE — ED Notes (Signed)
Pt states she was here on Monday and was told to come back and get it checked in 2 days.Pt states she has been taking antibiotics and percocet.

## 2014-11-12 NOTE — ED Notes (Signed)
Pt has +2 edema and some redness noted to right lower extremeity.

## 2014-12-25 ENCOUNTER — Other Ambulatory Visit: Payer: Self-pay | Admitting: *Deleted

## 2014-12-25 MED ORDER — ZOLPIDEM TARTRATE 10 MG PO TABS: 10.0000 mg | ORAL_TABLET | Freq: Every evening | ORAL | Status: DC | PRN

## 2015-02-13 ENCOUNTER — Encounter: Payer: Self-pay | Admitting: Obstetrics & Gynecology

## 2015-02-13 ENCOUNTER — Ambulatory Visit (INDEPENDENT_AMBULATORY_CARE_PROVIDER_SITE_OTHER): Payer: Medicaid Other | Admitting: Obstetrics & Gynecology

## 2015-02-13 ENCOUNTER — Other Ambulatory Visit (HOSPITAL_COMMUNITY)
Admission: RE | Admit: 2015-02-13 | Discharge: 2015-02-13 | Disposition: A | Discharge status: Home / Self Care | Payer: Medicaid Other | Source: Ambulatory Visit | Attending: Obstetrics & Gynecology | Admitting: Obstetrics & Gynecology

## 2015-02-13 VITALS — BP 130/90 | HR 68 | Ht 67.0 in | Wt 287.0 lb

## 2015-02-13 DIAGNOSIS — Z113 Encounter for screening for infections with a predominantly sexual mode of transmission: Secondary | ICD-10-CM | POA: Diagnosis present

## 2015-02-13 DIAGNOSIS — Z01419 Encounter for gynecological examination (general) (routine) without abnormal findings: Secondary | ICD-10-CM

## 2015-02-13 DIAGNOSIS — Z0000 Encounter for general adult medical examination without abnormal findings: Secondary | ICD-10-CM | POA: Diagnosis not present

## 2015-02-13 NOTE — Addendum Note (Signed)
Addended by: Criss AlvinePULLIAM, CHRYSTAL G on: 02/13/2015 09:52 AM   Modules accepted: Orders

## 2015-02-13 NOTE — Progress Notes (Signed)
Patient ID: [name], female   DOB: 12-Aug-1986, 29 y.o.   MRN: [id number] Subjective:     [name] is a 29 y.o. female here for a routine exam.  No LMP recorded. Patient is not currently having periods (Reason: IUD). G1P1001 Birth Control Method:  mirena Menstrual Calendar(currently): irregular secondary to #1  Current complaints: none.   Current acute medical issues:  hypertension   Recent Gynecologic History No LMP recorded. Patient is not currently having periods (Reason: IUD). Last Pap: 2013,  normal Last mammogram: ,    Past Medical History  Diagnosis Date  . Hypertension   . Polycystic ovarian syndrome   . Bronchitis   . Abnormal vaginal bleeding   . Sleep apnea   . Trichomonas contact, treated   . Obese   . Headache(784.0)   . Gallstone   . IUD (intrauterine device) in place 03/01/2013  . Yeast infection 03/01/2013  . History of kidney stones     Past Surgical History  Procedure Laterality Date  . Tonsillectomy    . Cesarean section  09/24/2012    Procedure: CESAREAN SECTION;  Surgeon: Willodean Rosenthal, MD;  Location: [facility];  Service: Gynecology;  Laterality: N/A;  . Cholecystectomy    . Cholecystectomy N/A 04/05/2013    Procedure: LAPAROSCOPIC CHOLECYSTECTOMY;  Surgeon: Dalia Heading, MD;  Location: [facility];  Service: General;  Laterality: N/A;    OB History    Gravida Para Term Preterm AB TAB SAB Ectopic Multiple Living   0 0 0 0 0 0 1      History   Social History  . Marital Status: Single    Spouse Name: N/A  . Number of Children: N/A  . Years of Education: N/A   Social History Main Topics  . Smoking status: Former Smoker    Types: Cigarettes    Quit date: 01/17/2012  . Smokeless tobacco: Never Used     Comment: 1 pack of cigarettes weekly when she was smoking  . Alcohol Use: Yes     Comment: social drink-monthly  . Drug Use: No  . Sexual Activity: Yes    Birth Control/ Protection: IUD   Other Topics Concern   . None   Social History Narrative    Family History  Problem Relation Age of Onset  . Diabetes Mother   . Asthma Brother   . Heart murmur Brother   . Diabetes Maternal Aunt   . Hypertension Maternal Aunt   . Diabetes Maternal Uncle   . Diabetes Maternal Grandmother   . Cancer - Lung Maternal Grandmother   . Diabetes Maternal Grandfather   . Hypertension Maternal Grandfather   . Stroke Maternal Grandfather      Current outpatient prescriptions:  .  amLODipine (NORVASC) 10 MG tablet, Take 10 mg by mouth daily., Disp: , Rfl:  .  cyclobenzaprine (FLEXERIL) 10 MG tablet, Take 1 tablet (10 mg total) by mouth every 6 (six) hours as needed. Takes for muscle spasms, Disp: 60 tablet, Rfl: 3 .  escitalopram (LEXAPRO) 20 MG tablet, Take 1 tablet (20 mg total) by mouth daily., Disp: 30 tablet, Rfl: 11 .  hydrochlorothiazide (HYDRODIURIL) 25 MG tablet, Take 25 mg by mouth daily., Disp: , Rfl:  .  levonorgestrel (MIRENA) 20 MCG/24HR IUD, 1 each by Intrauterine route once., Disp: , Rfl:  .  lisinopril (PRINIVIL,ZESTRIL) 10 MG tablet, Take 10 mg by mouth daily., Disp: , Rfl:  .  oxyCODONE-acetaminophen (ROXICET) 5-325 MG per  tablet, Take 1-2 tablets by mouth every 4 (four) hours as needed. (Patient not taking: Reported on 02/13/2015), Disp: 20 tablet, Rfl: 0 .  zolpidem (AMBIEN) 10 MG tablet, Take 1 tablet (10 mg total) by mouth at bedtime as needed for sleep. (Patient not taking: Reported on 02/13/2015), Disp: 30 tablet, Rfl: 3  Review of Systems  Review of Systems  Constitutional: Negative for fever, chills, weight loss, malaise/fatigue and diaphoresis.  HENT: Negative for hearing loss, ear pain, nosebleeds, congestion, sore throat, neck pain, tinnitus and ear discharge.   Eyes: Negative for blurred vision, double vision, photophobia, pain, discharge and redness.  Respiratory: Negative for cough, hemoptysis, sputum production, shortness of breath, wheezing and stridor.   Cardiovascular: Negative  for chest pain, palpitations, orthopnea, claudication, leg swelling and PND.  Gastrointestinal: negative for abdominal pain. Negative for heartburn, nausea, vomiting, diarrhea, constipation, blood in stool and melena.  Genitourinary: Negative for dysuria, urgency, frequency, hematuria and flank pain.  Musculoskeletal: Negative for myalgias, back pain, joint pain and falls.  Skin: Negative for itching and rash.  Neurological: Negative for dizziness, tingling, tremors, sensory change, speech change, focal weakness, seizures, loss of consciousness, weakness and headaches.  Endo/Heme/Allergies: Negative for environmental allergies and polydipsia. Does not bruise/bleed easily.  Psychiatric/Behavioral: Negative for depression, suicidal ideas, hallucinations, memory loss and substance abuse. The patient is not nervous/anxious and does not have insomnia.        Objective:  Blood pressure 130/90, pulse 68, height 5\' 7"  (1.702 m), weight 287 lb (130.182 kg).   Physical Exam  Vitals reviewed. Constitutional: She is oriented to person, place, and time. She appears well-developed and well-nourished.  HENT:  Head: Normocephalic and atraumatic.        Right Ear: External ear normal.  Left Ear: External ear normal.  Nose: Nose normal.  Mouth/Throat: Oropharynx is clear and moist.  Eyes: Conjunctivae and EOM are normal. Pupils are equal, round, and reactive to light. Right eye exhibits no discharge. Left eye exhibits no discharge. No scleral icterus.  Neck: Normal range of motion. Neck supple. No tracheal deviation present. No thyromegaly present.  Cardiovascular: Normal rate, regular rhythm, normal heart sounds and intact distal pulses.  Exam reveals no gallop and no friction rub.   No murmur heard. Respiratory: Effort normal and breath sounds normal. No respiratory distress. She has no wheezes. She has no rales. She exhibits no tenderness.  GI: Soft. Bowel sounds are normal. She exhibits no distension  and no mass. There is no tenderness. There is no rebound and no guarding.  Genitourinary:  Breasts no masses skin changes or nipple changes bilaterally      Vulva is normal without lesions Vagina is pink moist without discharge Cervix normal in appearance and pap is done Uterus is normal size shape and contour Adnexa is negative with normal sized ovaries   Musculoskeletal: Normal range of motion. She exhibits no edema and no tenderness.  Neurological: She is alert and oriented to person, place, and time. She has normal reflexes. She displays normal reflexes. No cranial nerve deficit. She exhibits normal muscle tone. Coordination normal.  Skin: Skin is warm and dry. No rash noted. No erythema. No pallor.  Psychiatric: She has a normal mood and affect. Her behavior is normal. Judgment and thought content normal.       Assessment:    Healthy female exam.    Plan:    Contraception: IUD. Follow up in: 1 year.

## 2015-02-16 LAB — CYTOLOGY - PAP

## 2015-03-31 ENCOUNTER — Other Ambulatory Visit: Payer: Self-pay | Admitting: Obstetrics & Gynecology

## 2015-04-21 ENCOUNTER — Encounter: Payer: Self-pay | Admitting: Obstetrics & Gynecology

## 2015-04-21 ENCOUNTER — Other Ambulatory Visit: Payer: Self-pay | Admitting: *Deleted

## 2015-04-21 ENCOUNTER — Ambulatory Visit (INDEPENDENT_AMBULATORY_CARE_PROVIDER_SITE_OTHER): Payer: Medicaid Other | Admitting: Obstetrics & Gynecology

## 2015-04-21 ENCOUNTER — Telehealth: Payer: Self-pay | Admitting: *Deleted

## 2015-04-21 VITALS — BP 142/90 | HR 84 | Wt 289.0 lb

## 2015-04-21 DIAGNOSIS — I10 Essential (primary) hypertension: Secondary | ICD-10-CM

## 2015-04-21 MED ORDER — HYDROCHLOROTHIAZIDE 25 MG PO TABS: 25.0000 mg | ORAL_TABLET | Freq: Every day | ORAL | Status: DC

## 2015-04-21 MED ORDER — LISINOPRIL 10 MG PO TABS: 10.0000 mg | ORAL_TABLET | Freq: Every day | ORAL | Status: DC

## 2015-04-21 MED ORDER — AMLODIPINE BESYLATE 10 MG PO TABS: 10.0000 mg | ORAL_TABLET | Freq: Every day | ORAL | Status: DC

## 2015-04-21 NOTE — Telephone Encounter (Addendum)
[name] at Dr. Gerilyn Pilgrim office states PCP will need to make referral for Sleep Apnea for pt medicaid to cover visit. No PCP listed in pt chart. Spoke with pt and she states she does not have a PCP. Pt informed for her MCD to cover referral appt she would need a PCP to make the referral per [facility]. Pt verbalized understanding.

## 2015-04-21 NOTE — Progress Notes (Signed)
Patient ID: [name], female   DOB: [birth year], 29 y.o.   MRN: [id number] Chief Complaint  Patient presents with  . need medications refill    medication expired. no medicine since April 2016.c/c headaches and b/p up.    Blood pressure 142/90, pulse 84, weight 289 lb (131.09 kg).  BP meds refilled: Norvasc, HCTZ, lisinopril  Needs to be referred to Dr Gerilyn Pilgrim for probable sleep apnea  No other complaints

## 2015-09-21 ENCOUNTER — Emergency Department (HOSPITAL_COMMUNITY): Payer: Medicaid Other

## 2015-09-21 ENCOUNTER — Encounter (HOSPITAL_COMMUNITY): Payer: Self-pay | Admitting: Emergency Medicine

## 2015-09-21 ENCOUNTER — Emergency Department (HOSPITAL_COMMUNITY)
Admission: EM | Admit: 2015-09-21 | Discharge: 2015-09-21 | Disposition: A | Discharge status: Home / Self Care | Payer: Medicaid Other | Attending: Emergency Medicine | Admitting: Emergency Medicine

## 2015-09-21 DIAGNOSIS — Z8709 Personal history of other diseases of the respiratory system: Secondary | ICD-10-CM | POA: Diagnosis not present

## 2015-09-21 DIAGNOSIS — I10 Essential (primary) hypertension: Secondary | ICD-10-CM | POA: Insufficient documentation

## 2015-09-21 DIAGNOSIS — Z975 Presence of (intrauterine) contraceptive device: Secondary | ICD-10-CM | POA: Diagnosis not present

## 2015-09-21 DIAGNOSIS — Z8619 Personal history of other infectious and parasitic diseases: Secondary | ICD-10-CM | POA: Diagnosis not present

## 2015-09-21 DIAGNOSIS — Z8719 Personal history of other diseases of the digestive system: Secondary | ICD-10-CM | POA: Diagnosis not present

## 2015-09-21 DIAGNOSIS — E669 Obesity, unspecified: Secondary | ICD-10-CM | POA: Insufficient documentation

## 2015-09-21 DIAGNOSIS — Z8669 Personal history of other diseases of the nervous system and sense organs: Secondary | ICD-10-CM | POA: Diagnosis not present

## 2015-09-21 DIAGNOSIS — R0789 Other chest pain: Secondary | ICD-10-CM | POA: Diagnosis not present

## 2015-09-21 DIAGNOSIS — Z79899 Other long term (current) drug therapy: Secondary | ICD-10-CM | POA: Diagnosis not present

## 2015-09-21 DIAGNOSIS — Z8742 Personal history of other diseases of the female genital tract: Secondary | ICD-10-CM | POA: Insufficient documentation

## 2015-09-21 DIAGNOSIS — Z87891 Personal history of nicotine dependence: Secondary | ICD-10-CM | POA: Insufficient documentation

## 2015-09-21 DIAGNOSIS — R079 Chest pain, unspecified: Secondary | ICD-10-CM | POA: Diagnosis present

## 2015-09-21 DIAGNOSIS — Z87442 Personal history of urinary calculi: Secondary | ICD-10-CM | POA: Diagnosis not present

## 2015-09-21 LAB — COMPREHENSIVE METABOLIC PANEL
ALK PHOS: 87 U/L (ref 38–126)
ALT: 12 U/L — AB (ref 14–54)
ANION GAP: 6 (ref 5–15)
AST: 18 U/L (ref 15–41)
Albumin: 3.8 g/dL (ref 3.5–5.0)
BILIRUBIN TOTAL: 0.3 mg/dL (ref 0.3–1.2)
BUN: 9 mg/dL (ref 6–20)
CALCIUM: 8.7 mg/dL — AB (ref 8.9–10.3)
CO2: 22 mmol/L (ref 22–32)
CREATININE: 0.7 mg/dL (ref 0.44–1.00)
Chloride: 110 mmol/L (ref 101–111)
Glucose, Bld: 111 mg/dL — ABNORMAL HIGH (ref 65–99)
Potassium: 3.7 mmol/L (ref 3.5–5.1)
SODIUM: 138 mmol/L (ref 135–145)
TOTAL PROTEIN: 7 g/dL (ref 6.5–8.1)

## 2015-09-21 LAB — LIPASE, BLOOD: LIPASE: 20 U/L (ref 11–51)

## 2015-09-21 LAB — CBC WITH DIFFERENTIAL/PLATELET
Basophils Absolute: 0 10*3/uL (ref 0.0–0.1)
Basophils Relative: 0 %
EOS ABS: 0.2 10*3/uL (ref 0.0–0.7)
Eosinophils Relative: 2 %
HEMATOCRIT: 37.2 % (ref 36.0–46.0)
HEMOGLOBIN: 12.6 g/dL (ref 12.0–15.0)
LYMPHS ABS: 3.5 10*3/uL (ref 0.7–4.0)
LYMPHS PCT: 48 %
MCH: 29.9 pg (ref 26.0–34.0)
MCHC: 33.9 g/dL (ref 30.0–36.0)
MCV: 88.2 fL (ref 78.0–100.0)
MONOS PCT: 7 %
Monocytes Absolute: 0.5 10*3/uL (ref 0.1–1.0)
NEUTROS PCT: 43 %
Neutro Abs: 3.2 10*3/uL (ref 1.7–7.7)
Platelets: 225 10*3/uL (ref 150–400)
RBC: 4.22 MIL/uL (ref 3.87–5.11)
RDW: 13.2 % (ref 11.5–15.5)
WBC: 7.4 10*3/uL (ref 4.0–10.5)

## 2015-09-21 LAB — D-DIMER, QUANTITATIVE (NOT AT ARMC)

## 2015-09-21 LAB — TROPONIN I

## 2015-09-21 MED ORDER — DICLOFENAC SODIUM 75 MG PO TBEC: 75.0000 mg | DELAYED_RELEASE_TABLET | Freq: Two times a day (BID) | ORAL | Status: DC

## 2015-09-21 MED ORDER — GI COCKTAIL ~~LOC~~
30.0000 mL | Freq: Once | ORAL | Status: AC
  Administered 2015-09-21: 30 mL via ORAL
  Filled 2015-09-21: qty 30

## 2015-09-21 MED ORDER — NITROGLYCERIN 0.4 MG SL SUBL: 0.4000 mg | SUBLINGUAL_TABLET | SUBLINGUAL | Status: DC | PRN

## 2015-09-21 NOTE — ED Notes (Signed)
PA at bedside.

## 2015-09-21 NOTE — Discharge Instructions (Signed)

## 2015-09-21 NOTE — ED Provider Notes (Signed)
CSN: [id number]     Arrival date & time 09/21/15  0847 History   First MD Initiated Contact with Patient 09/21/15 [phone]     Chief Complaint  Patient presents with  . Chest Pain     (Consider location/radiation/quality/duration/timing/severity/associated sxs/prior Treatment) Patient is a 29 y.o. female presenting with chest pain. The history is provided by the patient. No language interpreter was used.  Chest Pain Pain location:  L chest and R chest Pain quality: aching   Pain radiates to:  Does not radiate Pain radiates to the back: no   Pain severity:  Moderate Onset quality:  Gradual Timing:  Constant Progression:  Worsening Chronicity:  New Relieved by:  Nothing Worsened by:  Nothing tried Ineffective treatments:  None tried Associated symptoms: not vomiting   Risk factors: hypertension and smoking     Past Medical History  Diagnosis Date  . Hypertension   . Polycystic ovarian syndrome   . Bronchitis   . Abnormal vaginal bleeding   . Sleep apnea   . Trichomonas contact, treated   . Obese   . Headache(784.0)   . Gallstone   . IUD (intrauterine device) in place 03/01/2013  . Yeast infection 03/01/2013  . History of kidney stones    Past Surgical History  Procedure Laterality Date  . Tonsillectomy    . Cesarean section  09/24/2012    Procedure: CESAREAN SECTION;  Surgeon: Willodean Rosenthalarolyn Harraway-Smith, MD;  Location: [facility];  Service: Gynecology;  Laterality: N/A;  . Cholecystectomy    . Cholecystectomy N/A 04/05/2013    Procedure: LAPAROSCOPIC CHOLECYSTECTOMY;  Surgeon: Dalia HeadingMark A Jenkins, MD;  Location: [facility];  Service: General;  Laterality: N/A;   Family History  Problem Relation Age of Onset  . Diabetes Mother   . Asthma Brother   . Heart murmur Brother   . Diabetes Maternal Aunt   . Hypertension Maternal Aunt   . Diabetes Maternal Uncle   . Diabetes Maternal Grandmother   . Cancer - Lung Maternal Grandmother   . Diabetes Maternal Grandfather   . Hypertension  Maternal Grandfather   . Stroke Maternal Grandfather    Social History  Substance Use Topics  . Smoking status: Former Smoker    Types: Cigarettes    Quit date: 01/17/2012  . Smokeless tobacco: Never Used     Comment: 1 pack of cigarettes weekly when she was smoking  . Alcohol Use: Yes     Comment: social drink-monthly   OB History    Gravida Para Term Preterm AB TAB SAB Ectopic Multiple Living   1 1 1  0 0 0 0 0 0 1     Review of Systems  Cardiovascular: Positive for chest pain.  Gastrointestinal: Negative for vomiting.  All other systems reviewed and are negative.     Allergies  Review of patient's allergies indicates no known allergies.  Home Medications   Prior to Admission medications   Medication Sig Start Date End Date Taking? Authorizing Provider  levonorgestrel (MIRENA) 20 MCG/24HR IUD 1 each by Intrauterine route once.   Yes Historical Provider, MD  amLODipine (NORVASC) 10 MG tablet Take 1 tablet (10 mg total) by mouth daily. 04/21/15   Lazaro ArmsLuther H Eure, MD  escitalopram (LEXAPRO) 20 MG tablet TAKE ONE TABLET BY MOUTH ONCE DAILY 04/01/15   Lazaro ArmsLuther H Eure, MD  hydrochlorothiazide (HYDRODIURIL) 25 MG tablet Take 1 tablet (25 mg total) by mouth daily. 04/21/15   Lazaro ArmsLuther H Eure, MD  lisinopril (PRINIVIL,ZESTRIL) 10 MG tablet Take 1  tablet (10 mg total) by mouth daily. 04/21/15   Lazaro Arms, MD   BP 139/93 mmHg  Pulse 68  Temp(Src) 98.1 F (36.7 C) (Oral)  Resp 19  Ht  (1.676 m)  Wt 280 lb (127.007 kg)  BMI 45.21 kg/m2  SpO2 99% Physical Exam  Constitutional: She appears well-developed and well-nourished.  HENT:  Head: Normocephalic.  Left Ear: External ear normal.  Nose: Nose normal.  Mouth/Throat: Oropharynx is clear and moist.  Eyes: Conjunctivae are normal. Pupils are equal, round, and reactive to light.  Neck: Normal range of motion. Neck supple.  Cardiovascular: Normal rate and normal heart sounds.   Pulmonary/Chest: Effort normal.  Musculoskeletal:  Normal range of motion.  Neurological: She is alert.  Skin: Skin is warm.  Psychiatric: She has a normal mood and affect.  Nursing note and vitals reviewed.   ED Course  Procedures (including critical care time) Labs Review Labs Reviewed  COMPREHENSIVE METABOLIC PANEL - Abnormal; Notable for the following:    Glucose, Bld 111 (*)    Calcium 8.7 (*)    ALT 12 (*)    All other components within normal limits  CBC WITH DIFFERENTIAL/PLATELET  LIPASE, BLOOD  D-DIMER, QUANTITATIVE (NOT AT [facility])  TROPONIN I    Imaging Review Dg Chest 2 View  09/21/2015  CLINICAL DATA:  PT c/o intermittent center sharp chest pain x1 day with SOB with pain and swelling to right foot. PT states hx of HTN. PT denies any pain at this time or pain or tenderness upon palpation. EXAM: CHEST  2 VIEW COMPARISON:  07/24/2010 FINDINGS: Mild enlargement of the cardiac silhouette, stable. No mediastinal or hilar masses or evidence of adenopathy. Clear lungs.  No pleural effusion or pneumothorax. Skeletal structures are unremarkable. IMPRESSION: No active cardiopulmonary disease. Electronically Signed   By: Amie Portland M.D.   On: 09/21/2015 10:22   I have personally reviewed and evaluated these images and lab results as part of my medical decision-making.   EKG Interpretation   Date/Time:  Monday September 21 2015 08:55:30 EST Ventricular Rate:  74 PR Interval:  181 QRS Duration: 87 QT Interval:  385 QTC Calculation: 427 R Axis:   34 Text Interpretation:  Sinus rhythm Confirmed by ZAMMIT  MD, JOSEPH ([id number])  on 09/21/2015 10:33:58 AM      MDM troponin is negative. Ddimer negative, lft's normal.    I doubt cardiac or pulmonary etiology of pain.   I will treat with voltaren.   Pt is advised to see her MD for rechedk   Final diagnoses:  Chest wall pain    Voltaren avs     Elson Areas, PA-C 09/21/15 1137  Bethann Berkshire, MD 09/21/15 [phone]

## 2015-09-21 NOTE — ED Notes (Signed)
PT c/o intermittent center sharp chest pain x1 day with SOB with pain and swelling to right foot. PT states hx of HTN. PT denies any pain at this time or pain or tenderness upon palpation.

## 2015-11-24 ENCOUNTER — Ambulatory Visit: Payer: Medicaid Other | Admitting: Obstetrics & Gynecology

## 2015-12-01 ENCOUNTER — Ambulatory Visit (INDEPENDENT_AMBULATORY_CARE_PROVIDER_SITE_OTHER): Payer: Medicaid Other | Admitting: Adult Health

## 2015-12-01 ENCOUNTER — Encounter: Payer: Self-pay | Admitting: Adult Health

## 2015-12-01 VITALS — BP 120/74 | HR 65 | Ht 67.0 in | Wt 287.0 lb

## 2015-12-01 DIAGNOSIS — A499 Bacterial infection, unspecified: Secondary | ICD-10-CM

## 2015-12-01 DIAGNOSIS — N898 Other specified noninflammatory disorders of vagina: Secondary | ICD-10-CM

## 2015-12-01 DIAGNOSIS — N760 Acute vaginitis: Secondary | ICD-10-CM | POA: Diagnosis not present

## 2015-12-01 DIAGNOSIS — B9689 Other specified bacterial agents as the cause of diseases classified elsewhere: Secondary | ICD-10-CM | POA: Insufficient documentation

## 2015-12-01 DIAGNOSIS — Z975 Presence of (intrauterine) contraceptive device: Secondary | ICD-10-CM

## 2015-12-01 HISTORY — DX: Other specified noninflammatory disorders of vagina: N89.8

## 2015-12-01 LAB — POCT WET PREP (WET MOUNT)
CLUE CELLS WET PREP WHIFF POC: POSITIVE
WBC, Wet Prep HPF POC: POSITIVE

## 2015-12-01 MED ORDER — METRONIDAZOLE 500 MG PO TABS: 500.0000 mg | ORAL_TABLET | Freq: Two times a day (BID) | ORAL | Status: DC

## 2015-12-01 NOTE — Patient Instructions (Signed)
Bacterial Vaginosis Bacterial vaginosis is a vaginal infection that occurs when the normal balance of bacteria in the vagina is disrupted. It results from an overgrowth of certain bacteria. This is the most common vaginal infection in women of childbearing age. Treatment is important to prevent complications, especially in pregnant women, as it can cause a premature delivery. CAUSES  Bacterial vaginosis is caused by an increase in harmful bacteria that are normally present in smaller amounts in the vagina. Several different kinds of bacteria can cause bacterial vaginosis. However, the reason that the condition develops is not fully understood. RISK FACTORS Certain activities or behaviors can put you at an increased risk of developing bacterial vaginosis, including:  Having a new sex partner or multiple sex partners.  Douching.  Using an intrauterine device (IUD) for contraception. Women do not get bacterial vaginosis from toilet seats, bedding, swimming pools, or contact with objects around them. SIGNS AND SYMPTOMS  Some women with bacterial vaginosis have no signs or symptoms. Common symptoms include:  Grey vaginal discharge.  A fishlike odor with discharge, especially after sexual intercourse.  Itching or burning of the vagina and vulva.  Burning or pain with urination. DIAGNOSIS  Your health care provider will take a medical history and examine the vagina for signs of bacterial vaginosis. A sample of vaginal fluid may be taken. Your health care provider will look at this sample under a microscope to check for bacteria and abnormal cells. A vaginal pH test may also be done.  TREATMENT  Bacterial vaginosis may be treated with antibiotic medicines. These may be given in the form of a pill or a vaginal cream. A second round of antibiotics may be prescribed if the condition comes back after treatment. Because bacterial vaginosis increases your risk for sexually transmitted diseases, getting  treated can help reduce your risk for chlamydia, gonorrhea, HIV, and herpes. HOME CARE INSTRUCTIONS   Only take over-the-counter or prescription medicines as directed by your health care provider.  If antibiotic medicine was prescribed, take it as directed. Make sure you finish it even if you start to feel better.  Tell all sexual partners that you have a vaginal infection. They should see their health care provider and be treated if they have problems, such as a mild rash or itching.  During treatment, it is important that you follow these instructions:  Avoid sexual activity or use condoms correctly.  Do not douche.  Avoid alcohol as directed by your health care provider.  Avoid breastfeeding as directed by your health care provider. SEEK MEDICAL CARE IF:   Your symptoms are not improving after 3 days of treatment.  You have increased discharge or pain.  You have a fever. MAKE SURE YOU:   Understand these instructions.  Will watch your condition.  Will get help right away if you are not doing well or get worse. FOR MORE INFORMATION  Centers for Disease Control and Prevention, Division of STD Prevention: [employer] American Sexual Health Association (ASHA): www.ashastd.org    This information is not intended to replace advice given to you by your health care provider. Make sure you discuss any questions you have with your health care provider.   Document Released: 10/24/2005 Document Revised: 11/14/2014 Document Reviewed: 06/05/2013 Elsevier Interactive Patient Education 2016 [employer]. No sex no alcohol  Take flagyl Return in 3 months for physical.

## 2015-12-01 NOTE — Progress Notes (Signed)
Subjective:     Patient ID: [name], female   DOB: 1986-09-27, 30 y.o.   MRN: [id number]  HPI [name] is a 30 year old black female in complaining of vaginal discharge,odor and itch for over a week.She has an IUD.   Review of Systems Patient denies any headaches, hearing loss, fatigue, blurred vision, shortness of breath, chest pain, abdominal pain, problems with bowel movements, urination, or intercourse. No joint pain or mood swings.See HPI for positives. Reviewed past medical,surgical, social and family history. Reviewed medications and allergies.     Objective:   Physical Exam BP 120/74 mmHg  Pulse 65  Ht  (1.702 m)  Wt 287 lb (130.182 kg)  BMI 44.94 kg/m2 Skin warm and dry.Pelvic: external genitalia is normal in appearance no lesions, vagina: pinkish discharge with odor,urethra has no lesions or masses noted, cervix:smooth and bulbous,+IUD strings, uterus: normal size, shape and contour, non tender, no masses felt, adnexa: no masses or tenderness noted. Bladder is non tender and no masses felt. Wet prep: + for clue cells and +WBCs. GC/CHL obtained.     Assessment:     Vaginal discharge BV  IUD in place    Plan:    GC/CHL sent  Rx flagyl 500 mg 1 bid x 7 days, no alcohol, review handout on BV   Return in 3 months for physical

## 2015-12-02 LAB — GC/CHLAMYDIA PROBE AMP
CHLAMYDIA, DNA PROBE: NEGATIVE
Neisseria gonorrhoeae by PCR: NEGATIVE

## 2015-12-10 ENCOUNTER — Telehealth: Payer: Self-pay | Admitting: Adult Health

## 2015-12-10 NOTE — Telephone Encounter (Signed)
Spoke with pt. Pt was prescribed Flagyl. Pt states she is still having irritation and itching and discharge. Pt hasn't missed any doses. I spoke with JAG and she advised to keep taking med and let us know Monday if she is still having symptoms. Advised it can take a few days after finishing med to feel better. Pt voiced understanding. JSY

## 2015-12-14 ENCOUNTER — Telehealth: Payer: Self-pay | Admitting: *Deleted

## 2015-12-14 MED ORDER — METRONIDAZOLE 0.75 % VA GEL: 1.0000 | Freq: Every day | VAGINAL | Status: DC

## 2015-12-14 NOTE — Telephone Encounter (Signed)
Pt continues to c/o vaginal irritation and itching and vaginal discharge with odor. Pt states completed Flagyl on 12/09/2015.

## 2015-12-14 NOTE — Telephone Encounter (Signed)
Left message will call in metrogel to try , if still with symptoms, make appt

## 2015-12-28 ENCOUNTER — Ambulatory Visit: Payer: Medicaid Other | Admitting: Obstetrics & Gynecology

## 2016-01-01 ENCOUNTER — Ambulatory Visit: Payer: Medicaid Other | Admitting: Obstetrics & Gynecology

## 2016-01-27 ENCOUNTER — Ambulatory Visit: Payer: Medicaid Other | Admitting: Obstetrics & Gynecology

## 2016-02-01 ENCOUNTER — Encounter: Payer: Self-pay | Admitting: Obstetrics & Gynecology

## 2016-02-01 ENCOUNTER — Ambulatory Visit (INDEPENDENT_AMBULATORY_CARE_PROVIDER_SITE_OTHER): Payer: Medicaid Other | Admitting: Obstetrics & Gynecology

## 2016-02-01 VITALS — BP 130/90 | HR 82 | Ht 67.0 in | Wt 297.0 lb

## 2016-02-01 DIAGNOSIS — F329 Major depressive disorder, single episode, unspecified: Secondary | ICD-10-CM | POA: Diagnosis not present

## 2016-02-01 DIAGNOSIS — Z975 Presence of (intrauterine) contraceptive device: Secondary | ICD-10-CM | POA: Diagnosis not present

## 2016-02-01 DIAGNOSIS — F419 Anxiety disorder, unspecified: Secondary | ICD-10-CM | POA: Diagnosis not present

## 2016-02-01 DIAGNOSIS — F32A Depression, unspecified: Secondary | ICD-10-CM

## 2016-02-01 MED ORDER — ALPRAZOLAM 0.5 MG PO TABS: 0.5000 mg | ORAL_TABLET | Freq: Two times a day (BID) | ORAL | Status: DC

## 2016-02-01 MED ORDER — ESCITALOPRAM OXALATE 20 MG PO TABS: 20.0000 mg | ORAL_TABLET | Freq: Every day | ORAL | Status: DC

## 2016-02-01 NOTE — Progress Notes (Signed)
Patient ID: [name], female   DOB: 06-13-1986, 30 y.o.   MRN: [id number]      Chief Complaint  Patient presents with  . anxiety getting worse    Blood pressure 130/90, pulse 82, height 5\' 7"  (1.702 m), weight 297 lb (134.718 kg).  30 y.o. G1P1001 No LMP recorded. Patient is not currently having periods (Reason: IUD). The current method of family planning is IUD.  Subjective Patient had been increasing problems with feeling panicky depressed withdrawing anhedonia many sex drive No suicidal thoughts or ideations [place] phobia  Objective   Pertinent ROS   Labs or studies     Impression Diagnoses this Encounter::   ICD-9-CM ICD-10-CM   1. Depression 311 F32.9   2. Anxiety 300.00 F41.9     Established relevant diagnosis(es):   Plan/Recommendations: Meds ordered this encounter  Medications  . escitalopram (LEXAPRO) 20 MG tablet    Sig: Take 1 tablet (20 mg total) by mouth daily.    Dispense:  30 tablet    Refill:  11  . ALPRAZolam (XANAX) 0.5 MG tablet    Sig: Take 1 tablet (0.5 mg total) by mouth 2 (two) times daily.    Dispense:  60 tablet    Refill:  5    Labs or Scans Ordered: No orders of the defined types were placed in this encounter.    Management:: Medications as above Lexapro and Xanax in follow-up in 1 month for follow-up  Follow up Return in about 1 month (around 03/03/2016) for Follow up, with Dr Despina HiddenEure.        Face to face time:  15 minutes  Greater than 50% of the visit time was spent in counseling and coordination of care with the patient.  The summary and outline of the counseling and care coordination is summarized in the note above.   All questions were answered.

## 2016-02-29 ENCOUNTER — Ambulatory Visit: Payer: Medicaid Other | Admitting: Obstetrics & Gynecology

## 2016-03-01 ENCOUNTER — Ambulatory Visit (INDEPENDENT_AMBULATORY_CARE_PROVIDER_SITE_OTHER): Payer: Medicaid Other | Admitting: Obstetrics & Gynecology

## 2016-03-01 ENCOUNTER — Encounter: Payer: Self-pay | Admitting: Obstetrics & Gynecology

## 2016-03-01 VITALS — BP 120/80 | HR 70 | Wt 297.3 lb

## 2016-03-01 DIAGNOSIS — F419 Anxiety disorder, unspecified: Secondary | ICD-10-CM | POA: Diagnosis not present

## 2016-03-01 DIAGNOSIS — F329 Major depressive disorder, single episode, unspecified: Secondary | ICD-10-CM | POA: Diagnosis not present

## 2016-03-01 DIAGNOSIS — G4700 Insomnia, unspecified: Secondary | ICD-10-CM

## 2016-03-01 DIAGNOSIS — F32A Depression, unspecified: Secondary | ICD-10-CM

## 2016-03-01 DIAGNOSIS — Z975 Presence of (intrauterine) contraceptive device: Secondary | ICD-10-CM | POA: Diagnosis not present

## 2016-03-01 MED ORDER — ZOLPIDEM TARTRATE 10 MG PO TABS: 10.0000 mg | ORAL_TABLET | Freq: Every evening | ORAL | Status: DC | PRN

## 2016-03-01 NOTE — Progress Notes (Signed)
Patient ID: [name], female   DOB: 1986-08-20, 30 y.o.   MRN: [id number]      Chief Complaint  Patient presents with  . Follow-up    taking Xanax    Blood pressure 120/80, pulse 70, weight 297 lb 4.8 oz (134.854 kg).  30 y.o. G1P1001 No LMP recorded. Patient is not currently having periods (Reason: IUD). The current method of family planning is IUD.  Subjective Pt was started on xanax last month for panic attacks and some agoraphobia, improved modertely Using biofeedback techniques as well with success  Objective Na  Pertinent ROS No suicidal ideations or plans Sleep is poor No SOB chest pain  Labs or studies     Impression Diagnoses this Encounter::   ICD-9-CM ICD-10-CM   1. Anxiety 300.00 F41.9   2. Depression 311 F32.9   3. Insomnia 780.52 G47.00     Established relevant diagnosis(es):   Plan/Recommendations: Meds ordered this encounter  Medications  . zolpidem (AMBIEN) 10 MG tablet    Sig: Take 1 tablet (10 mg total) by mouth at bedtime as needed for sleep.    Dispense:  30 tablet    Refill:  3    Labs or Scans Ordered: No orders of the defined types were placed in this encounter.    Management:: Will add ambien 10 qhs prn  Follow up Return in about 3 months (around 05/31/2016) for Follow up, with Dr Despina HiddenEure.        Face to face time:  15 minutes  Greater than 50% of the visit time was spent in counseling and coordination of care with the patient.  The summary and outline of the counseling and care coordination is summarized in the note above.   All questions were answered.

## 2016-04-05 ENCOUNTER — Other Ambulatory Visit: Payer: Self-pay | Admitting: Obstetrics & Gynecology

## 2016-04-23 ENCOUNTER — Encounter (HOSPITAL_COMMUNITY): Payer: Self-pay | Admitting: Emergency Medicine

## 2016-04-23 ENCOUNTER — Emergency Department (HOSPITAL_COMMUNITY)
Admission: EM | Admit: 2016-04-23 | Discharge: 2016-04-23 | Disposition: A | Discharge status: Home / Self Care | Payer: Medicaid Other | Attending: Emergency Medicine | Admitting: Emergency Medicine

## 2016-04-23 DIAGNOSIS — Z79899 Other long term (current) drug therapy: Secondary | ICD-10-CM | POA: Insufficient documentation

## 2016-04-23 DIAGNOSIS — E669 Obesity, unspecified: Secondary | ICD-10-CM | POA: Diagnosis not present

## 2016-04-23 DIAGNOSIS — I10 Essential (primary) hypertension: Secondary | ICD-10-CM | POA: Insufficient documentation

## 2016-04-23 DIAGNOSIS — Z87891 Personal history of nicotine dependence: Secondary | ICD-10-CM | POA: Diagnosis not present

## 2016-04-23 DIAGNOSIS — R1011 Right upper quadrant pain: Secondary | ICD-10-CM | POA: Diagnosis present

## 2016-04-23 DIAGNOSIS — Z6841 Body Mass Index (BMI) 40.0 and over, adult: Secondary | ICD-10-CM | POA: Insufficient documentation

## 2016-04-23 DIAGNOSIS — R112 Nausea with vomiting, unspecified: Secondary | ICD-10-CM | POA: Insufficient documentation

## 2016-04-23 LAB — CBC WITH DIFFERENTIAL/PLATELET
BASOS PCT: 0 %
Basophils Absolute: 0 10*3/uL (ref 0.0–0.1)
EOS ABS: 0 10*3/uL (ref 0.0–0.7)
Eosinophils Relative: 1 %
HCT: 40.9 % (ref 36.0–46.0)
Hemoglobin: 13.6 g/dL (ref 12.0–15.0)
Lymphocytes Relative: 38 %
Lymphs Abs: 2.5 10*3/uL (ref 0.7–4.0)
MCH: 29.1 pg (ref 26.0–34.0)
MCHC: 33.3 g/dL (ref 30.0–36.0)
MCV: 87.4 fL (ref 78.0–100.0)
MONO ABS: 0.6 10*3/uL (ref 0.1–1.0)
MONOS PCT: 8 %
Neutro Abs: 3.5 10*3/uL (ref 1.7–7.7)
Neutrophils Relative %: 53 %
PLATELETS: 211 10*3/uL (ref 150–400)
RBC: 4.68 MIL/uL (ref 3.87–5.11)
RDW: 13.5 % (ref 11.5–15.5)
WBC: 6.5 10*3/uL (ref 4.0–10.5)

## 2016-04-23 LAB — URINALYSIS, ROUTINE W REFLEX MICROSCOPIC
Bilirubin Urine: NEGATIVE
Glucose, UA: NEGATIVE mg/dL
LEUKOCYTES UA: NEGATIVE
Nitrite: NEGATIVE
PH: 8.5 — AB (ref 5.0–8.0)
PROTEIN: 100 mg/dL — AB
SPECIFIC GRAVITY, URINE: 1.02 (ref 1.005–1.030)

## 2016-04-23 LAB — COMPREHENSIVE METABOLIC PANEL
ALK PHOS: 106 U/L (ref 38–126)
ALT: 21 U/L (ref 14–54)
AST: 30 U/L (ref 15–41)
Albumin: 4.4 g/dL (ref 3.5–5.0)
Anion gap: 9 (ref 5–15)
BUN: 7 mg/dL (ref 6–20)
CALCIUM: 8.8 mg/dL — AB (ref 8.9–10.3)
CHLORIDE: 109 mmol/L (ref 101–111)
CO2: 21 mmol/L — AB (ref 22–32)
CREATININE: 0.63 mg/dL (ref 0.44–1.00)
GFR calc Af Amer: >60 mL/min (ref >60)
GFR calc non Af Amer: >60 mL/min (ref >60)
Glucose, Bld: 128 mg/dL — ABNORMAL HIGH (ref 65–99)
Potassium: 3.2 mmol/L — ABNORMAL LOW (ref 3.5–5.1)
SODIUM: 139 mmol/L (ref 135–145)
Total Bilirubin: 0.4 mg/dL (ref 0.3–1.2)
Total Protein: 7.9 g/dL (ref 6.5–8.1)

## 2016-04-23 LAB — URINE MICROSCOPIC-ADD ON

## 2016-04-23 LAB — PREGNANCY, URINE: Preg Test, Ur: NEGATIVE

## 2016-04-23 LAB — LIPASE, BLOOD: Lipase: 16 U/L (ref 11–51)

## 2016-04-23 MED ORDER — SODIUM CHLORIDE 0.9 % IV BOLUS (SEPSIS)
1000.0000 mL | Freq: Once | INTRAVENOUS | Status: AC
  Administered 2016-04-23: 1000 mL via INTRAVENOUS

## 2016-04-23 MED ORDER — HYDROCODONE-ACETAMINOPHEN 5-325 MG PO TABS: 1.0000 | ORAL_TABLET | ORAL | Status: DC | PRN

## 2016-04-23 MED ORDER — MORPHINE SULFATE (PF) 4 MG/ML IV SOLN
4.0000 mg | Freq: Once | INTRAVENOUS | Status: AC
  Administered 2016-04-23: 4 mg via INTRAVENOUS

## 2016-04-23 MED ORDER — ONDANSETRON HCL 4 MG/2ML IJ SOLN
4.0000 mg | Freq: Once | INTRAMUSCULAR | Status: AC
  Administered 2016-04-23: 4 mg via INTRAVENOUS
  Filled 2016-04-23: qty 2

## 2016-04-23 MED ORDER — MORPHINE SULFATE (PF) 4 MG/ML IV SOLN
4.0000 mg | Freq: Once | INTRAVENOUS | Status: AC
  Administered 2016-04-23: 4 mg via INTRAVENOUS
  Filled 2016-04-23: qty 1

## 2016-04-23 MED ORDER — AMLODIPINE BESYLATE 5 MG PO TABS
10.0000 mg | ORAL_TABLET | Freq: Once | ORAL | Status: AC
Start: 2016-04-23 — End: 2016-04-23
  Administered 2016-04-23: 10 mg via ORAL
  Filled 2016-04-23: qty 2

## 2016-04-23 MED ORDER — PROMETHAZINE HCL 25 MG PO TABS: 25.0000 mg | ORAL_TABLET | Freq: Four times a day (QID) | ORAL | Status: DC | PRN

## 2016-04-23 NOTE — Discharge Instructions (Signed)
Tests showed no life-threatening condition. Prescription for pain medicine and nausea medicine

## 2016-04-23 NOTE — ED Notes (Signed)
MD at bedside to review results

## 2016-04-23 NOTE — ED Notes (Signed)
Pt states she vomited x 1 when she went to the bathroom, MD notified and orders obtained.

## 2016-04-23 NOTE — ED Provider Notes (Addendum)
History  By signing my name below, I, Earmon PhoenixJennifer Waddell, attest that this documentation has been prepared under the direction and in the presence of Donnetta HutchingBrian Zamarion Longest, MD. Electronically Signed: Earmon PhoenixJennifer Waddell, ED Scribe. 04/23/2016. 12:16 PM.  Chief Complaint  Patient presents with  . Emesis   The history is provided by the patient and medical records. No language interpreter was used.    HPI Comments:  Karleen DolphinBrittany N Bedingfield is a morbidly obese 30 y.o. female who presents to the Emergency Department complaining of sharp abdominal pain, worse at RUQ that began yesterday. She reports associated nausea and vomiting. She states this feels similar to the pain she experienced with pancreatitis, gall stones and kidney stones in the past. She has been eating saltine crackers and drinking ginger ale with minimal relief of the symptoms. She denies modifying factors. She denies fever, chills. She reports having a cholecystectomy about three years ago. She denies allergies to any medications.  Past Medical History  Diagnosis Date  . Hypertension   . Polycystic ovarian syndrome   . Bronchitis   . Abnormal vaginal bleeding   . Sleep apnea   . Trichomonas contact, treated   . Obese   . Headache(784.0)   . Gallstone   . IUD (intrauterine device) in place 03/01/2013  . Yeast infection 03/01/2013  . History of kidney stones   . Vaginal discharge 12/01/2015  . Anxiety    Past Surgical History  Procedure Laterality Date  . Tonsillectomy    . Cesarean section  09/24/2012    Procedure: CESAREAN SECTION;  Surgeon: Willodean Rosenthalarolyn Harraway-Smith, MD;  Location: [facility];  Service: Gynecology;  Laterality: N/A;  . Cholecystectomy    . Cholecystectomy N/A 04/05/2013    Procedure: LAPAROSCOPIC CHOLECYSTECTOMY;  Surgeon: Dalia HeadingMark A Jenkins, MD;  Location: [facility];  Service: General;  Laterality: N/A;   Family History  Problem Relation Age of Onset  . Diabetes Mother   . Asthma Brother   . Heart murmur Brother   . Diabetes  Maternal Aunt   . Hypertension Maternal Aunt   . Diabetes Maternal Uncle   . Diabetes Maternal Grandmother   . Cancer - Lung Maternal Grandmother   . Diabetes Maternal Grandfather   . Hypertension Maternal Grandfather   . Stroke Maternal Grandfather    Social History  Substance Use Topics  . Smoking status: Former Smoker    Types: Cigarettes    Quit date: 01/17/2012  . Smokeless tobacco: Never Used     Comment: 1 pack of cigarettes weekly when she was smoking  . Alcohol Use: Yes     Comment: social drink-monthly   OB History    Gravida Para Term Preterm AB TAB SAB Ectopic Multiple Living   1 1 1  0 0 0 0 0 0 1     Review of Systems A complete 10 system review of systems was obtained and all systems are negative except as noted in the HPI and PMH.   Allergies  Review of patient's allergies indicates no known allergies.  Home Medications   Prior to Admission medications   Medication Sig Start Date End Date Taking? Authorizing Provider  ALPRAZolam Prudy Feeler(XANAX) 0.5 MG tablet Take 1 tablet (0.5 mg total) by mouth 2 (two) times daily. 02/01/16  Yes Lazaro ArmsLuther H Eure, MD  cyclobenzaprine (FLEXERIL) 10 MG tablet TAKE ONE TABLET BY MOUTH EVERY 6 HOURS AS NEEDED FOR MUSCLE SPASMS 04/06/16  Yes Lazaro ArmsLuther H Eure, MD  diclofenac (VOLTAREN) 75 MG EC tablet Take 1 tablet (75  mg total) by mouth 2 (two) times daily. Patient taking differently: Take 75 mg by mouth 2 (two) times daily as needed for mild pain.  09/21/15  Yes Lonia Skinner Sofia, PA-C  escitalopram (LEXAPRO) 20 MG tablet Take 1 tablet (20 mg total) by mouth daily. 02/01/16  Yes Lazaro Arms, MD  levonorgestrel (MIRENA) 20 MCG/24HR IUD 1 each by Intrauterine route once.   Yes Historical Provider, MD  zolpidem (AMBIEN) 10 MG tablet Take 1 tablet (10 mg total) by mouth at bedtime as needed for sleep. Patient taking differently: Take 10 mg by mouth at bedtime.  03/01/16 04/23/16 Yes Lazaro Arms, MD  HYDROcodone-acetaminophen (NORCO/VICODIN) 5-325 MG  tablet Take 1-2 tablets by mouth every 4 (four) hours as needed. 04/23/16   Donnetta Hutching, MD  promethazine (PHENERGAN) 25 MG tablet Take 1 tablet (25 mg total) by mouth every 6 (six) hours as needed. 04/23/16   Donnetta Hutching, MD   Triage Vitals: BP 182/118 mmHg  Pulse 69  Temp(Src) 98 F (36.7 C) (Oral)  Resp 20  Ht 5\' 6"  (1.676 m)  Wt 287 lb (130.182 kg)  BMI 46.35 kg/m2  SpO2 100% Physical Exam  Constitutional: She is oriented to person, place, and time. She appears well-developed and well-nourished.  HENT:  Head: Normocephalic and atraumatic.  Eyes: Conjunctivae and EOM are normal. Pupils are equal, round, and reactive to light.  Neck: Normal range of motion. Neck supple.  Cardiovascular: Normal rate and regular rhythm.   Pulmonary/Chest: Effort normal and breath sounds normal.  Abdominal: Soft. Bowel sounds are normal. There is tenderness.  Tenderness to RUQ. Minimally tender to LUQ and minimal bilateral flank area.  Musculoskeletal: Normal range of motion.  Neurological: She is alert and oriented to person, place, and time.  Skin: Skin is warm and dry.  Psychiatric: She has a normal mood and affect. Her behavior is normal.  Nursing note and vitals reviewed.   ED Course  Procedures (including critical care time) DIAGNOSTIC STUDIES: Oxygen Saturation is 100% on RA, normal by my interpretation.   COORDINATION OF CARE: 11:33 AM- Will order IV fluids and labs. Pt verbalizes understanding and agrees to plan.  Medications  sodium chloride 0.9 % bolus 1,000 mL (0 mLs Intravenous Stopped 04/23/16 1300)  ondansetron (ZOFRAN) injection 4 mg (4 mg Intravenous Given 04/23/16 1151)  morphine 4 MG/ML injection 4 mg (4 mg Intravenous Given 04/23/16 1151)  sodium chloride 0.9 % bolus 1,000 mL (0 mLs Intravenous Stopped 04/23/16 1434)  morphine 4 MG/ML injection 4 mg (4 mg Intravenous Given 04/23/16 1301)  amLODipine (NORVASC) tablet 10 mg (10 mg Oral Given 04/23/16 1302)  ondansetron (ZOFRAN)  injection 4 mg (4 mg Intravenous Given 04/23/16 1402)    Labs Review Labs Reviewed  COMPREHENSIVE METABOLIC PANEL - Abnormal; Notable for the following:    Potassium 3.2 (*)    CO2 21 (*)    Glucose, Bld 128 (*)    Calcium 8.8 (*)    All other components within normal limits  URINALYSIS, ROUTINE W REFLEX MICROSCOPIC (NOT AT [facility]) - Abnormal; Notable for the following:    APPearance HAZY (*)    pH 8.5 (*)    Hgb urine dipstick SMALL (*)    Ketones, ur TRACE (*)    Protein, ur 100 (*)    All other components within normal limits  URINE MICROSCOPIC-ADD ON - Abnormal; Notable for the following:    Squamous Epithelial / LPF 6-30 (*)    Bacteria, UA MANY (*)  All other components within normal limits  CBC WITH DIFFERENTIAL/PLATELET  LIPASE, BLOOD  PREGNANCY, URINE    Imaging Review No results found. I have personally reviewed and evaluated these images and lab results as part of my medical decision-making.   EKG Interpretation None      MDM   Final diagnoses:  RUQ abdominal pain    No acute abdomen. Tests showed no life-threatening condition. White count, liver functions, lipase all normal. Discharge medication Norco and Phenergan 25 mg.  Patient has been out of her blood pressure medicine for several months. Rx Norvasc 5 mg daily and lisinopril HCTZ 10/12.5 daily I personally performed the services described in this documentation, which was scribed in my presence. The recorded information has been reviewed and is accurate.      Donnetta Hutching, MD 04/23/16 1411  Donnetta Hutching, MD 04/25/16 [phone]

## 2016-04-23 NOTE — ED Notes (Signed)
Patient c/o constant vomiting since yesterday. Per patient has upper abd pain x1 week in which she contributed to pancreatitis and has made an appointment at Dr Forestine ChuteEure's. Denies any diarrhea or fevers. Patient does report frequency with urination but denies pain. Per patient last normal BM this morning.

## 2016-05-31 ENCOUNTER — Ambulatory Visit: Payer: Medicaid Other | Admitting: Obstetrics & Gynecology

## 2016-06-02 ENCOUNTER — Encounter: Payer: Self-pay | Admitting: Obstetrics & Gynecology

## 2016-06-02 ENCOUNTER — Ambulatory Visit (INDEPENDENT_AMBULATORY_CARE_PROVIDER_SITE_OTHER): Payer: Medicaid Other | Admitting: Obstetrics & Gynecology

## 2016-06-02 ENCOUNTER — Telehealth: Payer: Self-pay | Admitting: Obstetrics & Gynecology

## 2016-06-02 VITALS — BP 110/80 | HR 80 | Ht 67.0 in | Wt 286.0 lb

## 2016-06-02 DIAGNOSIS — Z113 Encounter for screening for infections with a predominantly sexual mode of transmission: Secondary | ICD-10-CM | POA: Diagnosis not present

## 2016-06-02 DIAGNOSIS — F32A Depression, unspecified: Secondary | ICD-10-CM

## 2016-06-02 DIAGNOSIS — F419 Anxiety disorder, unspecified: Secondary | ICD-10-CM | POA: Diagnosis not present

## 2016-06-02 DIAGNOSIS — Z975 Presence of (intrauterine) contraceptive device: Secondary | ICD-10-CM | POA: Diagnosis not present

## 2016-06-02 DIAGNOSIS — F329 Major depressive disorder, single episode, unspecified: Secondary | ICD-10-CM

## 2016-06-02 NOTE — Telephone Encounter (Signed)
Spoke with receptionist at Dr. Tenny Craw' office will send referral form to be completed and faxed back and they will contact pt with an appt date and time.   Pt informed.

## 2016-06-02 NOTE — Progress Notes (Signed)
      Chief Complaint  Patient presents with  . Follow-up    anxiety and want std check    Blood pressure 110/80, pulse 80, height 5\' 7"  (1.702 m), weight 286 lb (129.7 kg).  30 y.o. G1P1001 No LMP recorded. Patient is not currently having periods (Reason: IUD). The current method of family planning is IUD.  Subjective Pt with on going problems with agoraphobia Still having some panic attacks, about 2 times per week No suicidal ideations Is afraid to be alone Has strong family history of psychiatric issues(brother, mother specifically) Taking her xanax and lexapro Sleeping is better  Has a new sexual partner wants STD check but there is no need to do blood work this quickly [employer] chlamydia is reasonable today, otherwise  no specific complaints  Objective   Pertinent ROS No burning with urination, frequency or urgency No nausea, vomiting or diarrhea Nor fever chills or other constitutional symptoms   Labs or studies GC Chlamydia    Impression Diagnoses this Encounter::   ICD-9-CM ICD-10-CM   1. Depression 311 F32.9   2. Anxiety 300.00 F41.9   3. Screening examination for STD (sexually transmitted disease) V74.5 Z11.3 GC/Chlamydia Probe Amp    Established relevant diagnosis(es):   Plan/Recommendations: Meds ordered this encounter  Medications  . zolpidem (AMBIEN) 10 MG tablet    Sig: Take 10 mg by mouth at bedtime as needed for sleep.  Marland Kitchen lisinopril-hydrochlorothiazide (PRINZIDE,ZESTORETIC) 10-12.5 MG tablet    Sig: Take 1 tablet by mouth daily.  Marland Kitchen amLODipine (NORVASC) 5 MG tablet    Sig: Take 5 mg by mouth daily.    Labs or Scans Ordered: Orders Placed This Encounter  Procedures  . GC/Chlamydia Probe Amp    Management:: Refer to Dr Tenny Craw for further evaluation and therapy of her non responsive depression and anxiety disorder, pt aware beyond this level she needs more intensive med management and therapy more than likely  Follow up Return in about 3  months (around 09/02/2016) for yearly, with Dr Despina Hidden.        Face to face time:  15 minutes  Greater than 50% of the visit time was spent in counseling and coordination of care with the patient.  The summary and outline of the counseling and care coordination is summarized in the note above.   All questions were answered.

## 2016-06-04 LAB — GC/CHLAMYDIA PROBE AMP
Chlamydia trachomatis, NAA: NEGATIVE
Neisseria gonorrhoeae by PCR: NEGATIVE

## 2016-06-09 ENCOUNTER — Telehealth: Payer: Self-pay | Admitting: Obstetrics & Gynecology

## 2016-06-10 NOTE — Telephone Encounter (Signed)
Pt informed unable to reach anyone at [facility] office today left a message. Will call when I hear from Dr.Ross's office. Pt verbalized understanding.

## 2016-06-16 NOTE — Telephone Encounter (Signed)
Pt informed referral and required records faxed to Dr. Charlott Rakesoss's office. Pt should hear from Dr. Charlott Rakesoss's office for an appt. Pt verbalized understanding.

## 2016-07-13 ENCOUNTER — Telehealth: Payer: Self-pay | Admitting: Obstetrics & Gynecology

## 2016-07-13 NOTE — Telephone Encounter (Signed)
Pt called stating that she was returning chrystals phone call. Please contact pt

## 2016-07-13 NOTE — Telephone Encounter (Signed)
Pt states took her Xanax and Lexapro today and vomited up. Pt states she has taken before and never had this happen. Pt advised to take again at next appropriate time with something on her stomach.   Pt states she has still not heard from Dr. Charlott Rakesoss's (Behavioral health) office from the referral. Pt states she has called Dr.Ross's office several times and has not heard from them.   Pt also states she is about to be out of both the Lexapro and the Xanax. Please advise.

## 2016-08-23 ENCOUNTER — Other Ambulatory Visit: Payer: Self-pay | Admitting: Obstetrics & Gynecology

## 2016-09-02 ENCOUNTER — Ambulatory Visit: Payer: Medicaid Other | Admitting: Obstetrics & Gynecology

## 2016-10-22 ENCOUNTER — Other Ambulatory Visit: Payer: Self-pay | Admitting: Obstetrics & Gynecology

## 2016-12-22 ENCOUNTER — Ambulatory Visit (INDEPENDENT_AMBULATORY_CARE_PROVIDER_SITE_OTHER): Payer: Medicaid Other | Admitting: Adult Health

## 2016-12-22 ENCOUNTER — Telehealth: Payer: Self-pay | Admitting: *Deleted

## 2016-12-22 ENCOUNTER — Encounter: Payer: Self-pay | Admitting: Adult Health

## 2016-12-22 VITALS — BP 140/92 | HR 76 | Ht 67.0 in | Wt 295.0 lb

## 2016-12-22 DIAGNOSIS — Z1389 Encounter for screening for other disorder: Secondary | ICD-10-CM

## 2016-12-22 DIAGNOSIS — N898 Other specified noninflammatory disorders of vagina: Secondary | ICD-10-CM | POA: Diagnosis not present

## 2016-12-22 DIAGNOSIS — B9689 Other specified bacterial agents as the cause of diseases classified elsewhere: Secondary | ICD-10-CM | POA: Diagnosis not present

## 2016-12-22 DIAGNOSIS — F332 Major depressive disorder, recurrent severe without psychotic features: Secondary | ICD-10-CM | POA: Insufficient documentation

## 2016-12-22 DIAGNOSIS — N760 Acute vaginitis: Secondary | ICD-10-CM

## 2016-12-22 DIAGNOSIS — F329 Major depressive disorder, single episode, unspecified: Secondary | ICD-10-CM

## 2016-12-22 DIAGNOSIS — R319 Hematuria, unspecified: Secondary | ICD-10-CM

## 2016-12-22 DIAGNOSIS — L298 Other pruritus: Secondary | ICD-10-CM

## 2016-12-22 DIAGNOSIS — F32A Depression, unspecified: Secondary | ICD-10-CM | POA: Insufficient documentation

## 2016-12-22 DIAGNOSIS — Z975 Presence of (intrauterine) contraceptive device: Secondary | ICD-10-CM | POA: Diagnosis not present

## 2016-12-22 LAB — POCT WET PREP (WET MOUNT)
Clue Cells Wet Prep Whiff POC: POSITIVE
WBC WET PREP: POSITIVE

## 2016-12-22 LAB — POCT URINALYSIS DIPSTICK
Glucose, UA: NEGATIVE
LEUKOCYTES UA: NEGATIVE
NITRITE UA: NEGATIVE

## 2016-12-22 MED ORDER — METRONIDAZOLE 0.75 % VA GEL: 1.0000 | Freq: Every day | VAGINAL | 1 refills | Status: DC

## 2016-12-22 NOTE — Patient Instructions (Signed)
Call DR Tenny Crawoss

## 2016-12-22 NOTE — Progress Notes (Signed)
Subjective:     Patient ID: [name], female   DOB: [birth year], 31 y.o.   MRN: [id number]  HPI [name] is a 31 year old black female in complaining of vaginal irritation and itching with urination, just finished a period.  Review of Systems +vaginal irritation and itching with urination Reviewed past medical,surgical, social and family history. Reviewed medications and allergies.     Objective:   Physical Exam BP (!) 140/92 (BP Location: Left Arm, Patient Position: Sitting, Cuff Size: Large)   Pulse 76   Ht 5\' 7"  (1.702 m)   Wt 295 lb (133.8 kg)   LMP 12/15/2016 (Exact Date)   BMI 46.20 kg/m urine 1+blood and 1+ protein. Skin warm and dry.Pelvic: external genitalia is normal in appearance no lesions, vagina: tan discharge with odor,urethra has no lesions or masses noted, cervix:smooth and bulbous,+IUD strings, uterus: normal size, shape and contour, non tender, no masses felt, adnexa: no masses or tenderness noted. Bladder is non tender and no masses felt. Wet prep: + for clue cells and +WBCs. PHQ 9 score 22, denies suicidal ideation and is on meds, but has not seen Dr Wanda Plumposs,gave her number to call her for appt.    Assessment:     1. Vaginal irritation   2. Vaginal itching   3. Vaginal discharge   4. BV (bacterial vaginosis)   5. IUD (intrauterine device) in place   6. Depression, unspecified depression type   7.  Hematuria     Plan:    UA C&S and GC/CHL sent on urine Rx metrogel use 1 appl. In vagina at hs x 5 nights and save to use prn with 1 refill Call Dr Tenny Crawoss, number given Follow up prn

## 2016-12-23 LAB — MICROSCOPIC EXAMINATION
CASTS: NONE SEEN /LPF
Epithelial Cells (non renal): >10 /hpf — AB (ref 0–10)

## 2016-12-23 LAB — URINALYSIS, ROUTINE W REFLEX MICROSCOPIC
Bilirubin, UA: NEGATIVE
Glucose, UA: NEGATIVE
Ketones, UA: NEGATIVE
LEUKOCYTES UA: NEGATIVE
Nitrite, UA: NEGATIVE
PH UA: 7.5 (ref 5.0–7.5)
Specific Gravity, UA: 1.023 (ref 1.005–1.030)
Urobilinogen, Ur: 0.2 mg/dL (ref 0.2–1.0)

## 2016-12-24 LAB — URINE CULTURE: Organism ID, Bacteria: NO GROWTH

## 2016-12-24 LAB — GC/CHLAMYDIA PROBE AMP
Chlamydia trachomatis, NAA: NEGATIVE
NEISSERIA GONORRHOEAE BY PCR: NEGATIVE

## 2016-12-26 NOTE — Telephone Encounter (Signed)
Left message that urine culture was no growth growth and GC/CHL were both negative, could try OTC monistat if desired but I would just increase fluids

## 2016-12-26 NOTE — Telephone Encounter (Signed)
Informed patient [facility] was [facility] and should be called for appointment. She also states that she is still having vaginal discomfort, and burning with urination. Please advise.

## 2017-01-03 ENCOUNTER — Ambulatory Visit: Payer: Medicaid Other | Admitting: Obstetrics & Gynecology

## 2017-01-21 ENCOUNTER — Other Ambulatory Visit: Payer: Self-pay | Admitting: Obstetrics & Gynecology

## 2017-01-22 ENCOUNTER — Other Ambulatory Visit: Payer: Self-pay | Admitting: Obstetrics & Gynecology

## 2017-02-01 ENCOUNTER — Telehealth: Payer: Self-pay | Admitting: Adult Health

## 2017-02-01 NOTE — Telephone Encounter (Signed)
Patient called stating she has an open area to her labia which itches and burns. She states she has not had intercourse in several weeks. She has recently shaved and thought it was from shaving but states it looks like a sore. Advised patient it could be a clogged hair follicle, but without looking at it, its hard to diagnose. Advised patient to schedule appointment to have provider look at it. Pt verbalized understanding. Will schedule.

## 2017-02-01 NOTE — Telephone Encounter (Signed)
Pt called stating that she would like to speak with the nurse because she is having some Pain in vaginal area and it is  extremely sensitive. Pt thinks she might have an open wound. Pt states it hurts to wipe and shower. Please contact pt

## 2017-02-06 ENCOUNTER — Ambulatory Visit (INDEPENDENT_AMBULATORY_CARE_PROVIDER_SITE_OTHER): Payer: Medicaid Other | Admitting: Women's Health

## 2017-02-06 ENCOUNTER — Other Ambulatory Visit: Payer: Self-pay | Admitting: Women's Health

## 2017-02-06 ENCOUNTER — Encounter: Payer: Self-pay | Admitting: Women's Health

## 2017-02-06 VITALS — BP 136/96 | HR 64 | Ht 67.0 in | Wt 292.0 lb

## 2017-02-06 DIAGNOSIS — N9089 Other specified noninflammatory disorders of vulva and perineum: Secondary | ICD-10-CM | POA: Diagnosis not present

## 2017-02-06 MED ORDER — VALACYCLOVIR HCL 1 G PO TABS: 1000.0000 mg | ORAL_TABLET | Freq: Every day | ORAL | 0 refills | Status: DC

## 2017-02-06 NOTE — Addendum Note (Signed)
Addended by: Gaylyn Rong A on: 02/06/2017 02:44 PM   Modules accepted: Orders

## 2017-02-06 NOTE — Progress Notes (Signed)
   [facility] Visit  Patient name: [name] MRN [id number]  Date of birth: 02-13-1986  CC & HPI:  [name] is a 31 y.o. G68P1001 African American female presenting today for report of painful scratch on vulva x 1wk. Shaved area last week, then noticed painful area when soap would hit it few days later. No h/o HSV.  No LMP recorded. Patient is not currently having periods (Reason: IUD). The current method of family planning is IUD. Last pap 02/13/15 neg  Pertinent History Reviewed:  Medical & Surgical Hx:   Past medical, surgical, family, and social history reviewed in electronic medical record Medications: Reviewed & Updated - see associated section Allergies: Reviewed in electronic medical record  Objective Findings:  Vitals: BP (!) 136/96 (BP Location: Right Arm, Patient Position: Sitting, Cuff Size: Large)   Pulse 64   Ht  (1.702 m)   Wt 292 lb (132.5 kg)   BMI 45.73 kg/m  Body mass index is 45.73 kg/m.  Physical Examination: General appearance - alert, well appearing, and in no distress Pelvic - ~0.5cm painful round white ulcerated lesion Rt upper inner labia majora, HSV culture obtained  No results found for this or any previous visit (from the past 24 hour(s)).   Assessment & Plan:  A:   Vulvar lesion  P:  HSV culture sent  Rx valacyclovir 1gm daily x 5d  As scheduled in May for pap&physical  Marge Duncans CNM, [facility] 02/06/2017 2:32 PM

## 2017-02-06 NOTE — Patient Instructions (Signed)
Genital Herpes Genital herpes is a common sexually transmitted infection (STI) that is caused by a virus. The virus spreads from person to person through sexual contact. Infection can cause itching, blisters, and sores around the genitals or rectum. Symptoms may last several days and then go away This is called an outbreak. However, the virus remains in your body, so you may have more outbreaks in the future. The time between outbreaks varies and can be months or years. Genital herpes affects men and women. It is particularly concerning for pregnant women because the virus can be passed to the baby during delivery and can cause serious problems. Genital herpes is also a concern for people who have a weak disease-fighting (immune) system. What are the causes? This condition is caused by the herpes simplex virus (HSV) type 1 or type 2. The virus may spread through:  Sexual contact with an infected person, including vaginal, anal, and oral sex.  Contact with fluid from a herpes sore.  The skin. This means that you can get herpes from an infected partner even if he or she does not have a visible sore or does not know that he or she is infected. What increases the risk? You are more likely to develop this condition if:  You have sex with many partners.  You do not use latex condoms during sex. What are the signs or symptoms? Most people do not have symptoms (asymptomatic) or have mild symptoms that may be mistaken for other skin problems. Symptoms may include:  Small red bumps near the genitals, rectum, or mouth. These bumps turn into blisters and then turn into sores.  Flu-like symptoms, including:  Fever.  Body aches.  Swollen lymph nodes.  Headache.  Painful urination.  Pain and itching in the genital area or rectal area.  Vaginal discharge.  Tingling or shooting pain in the legs and buttocks. Generally, symptoms are more severe and last longer during the first (primary)  outbreak. Flu-like symptoms are also more common during the primary outbreak. How is this diagnosed? Genital herpes may be diagnosed based on:  A physical exam.  Your medical history.  Blood tests.  A test of a fluid sample (culture) from an open sore. How is this treated? There is no cure for this condition, but treatment with antiviral medicines that are taken by mouth (orally) can do the following:  Speed up healing and relieve symptoms.  Help to reduce the spread of the virus to sexual partners.  Limit the chance of future outbreaks, or make future outbreaks shorter.  Lessen symptoms of future outbreaks. Your health care provider may also recommend pain relief medicines, such as aspirin or ibuprofen. Follow these instructions at home: Sexual activity   Do not have sexual contact during active outbreaks.  Practice safe sex. Latex condoms and female condoms may help prevent the spread of the herpes virus. General instructions   Keep the affected areas dry and clean.  Take over-the-counter and prescription medicines only as told by your health care provider.  Avoid rubbing or touching blisters and sores. If you do touch blisters or sores:  Wash your hands thoroughly with soap and water.  Do not touch your eyes afterward.  To help relieve pain or itching, you may take the following actions as directed by your health care provider:  Apply a cold, wet cloth (cold compress) to affected areas 4-6 times a day.  Apply a substance that protects your skin and reduces bleeding (astringent).  Apply a   gel that helps relieve pain around sores (lidocaine gel).  Take a warm, shallow bath that cleans the genital area (sitz bath).  Keep all follow-up visits as told by your health care provider. This is important. How is this prevented?  Use condoms. Although anyone can get genital herpes during sexual contact, even with the use of a condom, a condom can provide some  protection.  Avoid having multiple sexual partners.  Talk with your sexual partner about any symptoms either of you may have. Also, talk with your partner about any history of STIs.  Get tested for STIs before you have sex. Ask your partner to do the same.  Do not have sexual contact if you have symptoms of genital herpes. Contact a health care provider if:  Your symptoms are not improving with medicine.  Your symptoms return.  You have new symptoms.  You have a fever.  You have abdominal pain.  You have redness, swelling, or pain in your eye.  You notice new sores on other parts of your body.  You are a woman and experience bleeding between menstrual periods.  You have had herpes and you become pregnant or plan to become pregnant. Summary  Genital herpes is a common sexually transmitted infection (STI) that is caused by the herpes simplex virus (HSV) type 1 or type 2.  These viruses are most often spread through sexual contact with an infected person.  You are more likely to develop this condition if you have sex with many partners or you have unprotected sex.  Most people do not have symptoms (asymptomatic) or have mild symptoms that may be mistaken for other skin problems. Symptoms occur as outbreaks that may happen months or years apart.  There is no cure for this condition, but treatment with oral antiviral medicines can reduce symptoms, reduce the chance of spreading the virus to a partner, prevent future outbreaks, or shorten future outbreaks. This information is not intended to replace advice given to you by your health care provider. Make sure you discuss any questions you have with your health care provider. Document Released: 10/21/2000 Document Revised: 09/23/2016 Document Reviewed: 09/23/2016 Elsevier Interactive Patient Education  2017 [employer]  

## 2017-02-06 NOTE — Addendum Note (Signed)
Addended by: Cheral Marker on: 02/06/2017 02:38 PM   Modules accepted: Orders

## 2017-02-09 ENCOUNTER — Telehealth: Payer: Self-pay | Admitting: *Deleted

## 2017-02-09 ENCOUNTER — Encounter: Payer: Self-pay | Admitting: Women's Health

## 2017-02-09 DIAGNOSIS — B009 Herpesviral infection, unspecified: Secondary | ICD-10-CM | POA: Insufficient documentation

## 2017-02-09 LAB — HERPES SIMPLEX VIRUS CULTURE

## 2017-02-09 NOTE — Telephone Encounter (Signed)
Informed patient of positive herpes culture. States she wants to take the med daily to prevent future outbreaks but wants to know if there are smaller pills. Please advise.

## 2017-02-09 NOTE — Telephone Encounter (Signed)
LMOVM to return call regarding response from [place].   Acyclovir is smaller but she would have to take bid instead of daily. She could also cut the valacylovir (the one she has) in 1/2 if that would be easier. Just let me know which one she wants.

## 2017-02-10 ENCOUNTER — Telehealth: Payer: Self-pay | Admitting: *Deleted

## 2017-02-10 MED ORDER — VALACYCLOVIR HCL 1 G PO TABS: 1000.0000 mg | ORAL_TABLET | Freq: Every day | ORAL | 11 refills | Status: DC

## 2017-02-10 NOTE — Addendum Note (Signed)
Addended by: Shawna Clamp R on: 02/10/2017 11:58 AM   Modules accepted: Orders

## 2017-02-10 NOTE — Telephone Encounter (Signed)
Patient states she will try cutting tab in half and will let us know if that doesn't work for her.

## 2017-02-13 ENCOUNTER — Encounter: Payer: Self-pay | Admitting: Women's Health

## 2017-02-16 ENCOUNTER — Encounter: Payer: Self-pay | Admitting: Adult Health

## 2017-02-16 ENCOUNTER — Ambulatory Visit (INDEPENDENT_AMBULATORY_CARE_PROVIDER_SITE_OTHER): Payer: Medicaid Other | Admitting: Adult Health

## 2017-02-16 VITALS — BP 132/78 | HR 72 | Ht 67.0 in | Wt 295.0 lb

## 2017-02-16 DIAGNOSIS — N898 Other specified noninflammatory disorders of vagina: Secondary | ICD-10-CM | POA: Diagnosis not present

## 2017-02-16 DIAGNOSIS — L298 Other pruritus: Secondary | ICD-10-CM

## 2017-02-16 DIAGNOSIS — Z975 Presence of (intrauterine) contraceptive device: Secondary | ICD-10-CM

## 2017-02-16 DIAGNOSIS — Z8619 Personal history of other infectious and parasitic diseases: Secondary | ICD-10-CM | POA: Diagnosis not present

## 2017-02-16 DIAGNOSIS — B9689 Other specified bacterial agents as the cause of diseases classified elsewhere: Secondary | ICD-10-CM | POA: Diagnosis not present

## 2017-02-16 DIAGNOSIS — N760 Acute vaginitis: Secondary | ICD-10-CM

## 2017-02-16 LAB — POCT WET PREP (WET MOUNT)
Clue Cells Wet Prep Whiff POC: POSITIVE
WBC, Wet Prep HPF POC: POSITIVE

## 2017-02-16 MED ORDER — METRONIDAZOLE 500 MG PO TABS: 500.0000 mg | ORAL_TABLET | Freq: Two times a day (BID) | ORAL | 1 refills | Status: DC

## 2017-02-16 NOTE — Progress Notes (Signed)
Subjective:     Patient ID: [name], female   DOB: 02-07-1986, 31 y.o.   MRN: [id number]  HPI [name] is a 31 year old black female in complaining of vaginal discharge and irritation/itching, and low abdominal pain.She was recently diagnosed with HSV 2.   Review of Systems +vaginal discharge Vaginal irritation/itch Low abdominal pain Reviewed past medical,surgical, social and family history. Reviewed medications and allergies.     Objective:   Physical Exam BP 132/78 (BP Location: Left Arm, Patient Position: Sitting, Cuff Size: Large)   Pulse 72   Ht  (1.702 m)   Wt 295 lb (133.8 kg)   BMI 46.20 kg/m    Skin warm and dry.Pelvic: external genitalia is normal in appearance no lesions, vagina: white discharge with odor,urethra has no lesions or masses noted, cervix:bulbous,+IUD strings. uterus: normal size, shape and contour, non tender, no masses felt, adnexa: no masses or tenderness noted. Bladder is non tender and no masses felt. Wet prep: + for clue cells and +WBCs. Discussed HSV and transmission and gave her handout by [name] on herpes. Use 1 razor for peri area, only water in tub when soaks and use dial antibacterial soap on peri area. Face time 15 minutes with 50 % counseling.   Assessment:     1. Vaginal discharge   2. Vaginal irritation   3. Vaginal itching   4. BV (bacterial vaginosis)   5. History of herpes simplex type 2 infection   6.      IUD in place     Plan:     Meds ordered this encounter  Medications  . metroNIDAZOLE (FLAGYL) 500 MG tablet    Sig: Take 1 tablet (500 mg total) by mouth 2 (two) times daily.    Dispense:  14 tablet    Refill:  1    Order Specific Question:   Supervising Provider    Answer:   [name] [[id number]]  Review handouts on BV and herpes Return as scheduled for physical 5/2

## 2017-02-16 NOTE — Patient Instructions (Signed)
Genital Herpes Genital herpes is a common sexually transmitted infection (STI) that is caused by a virus. The virus spreads from person to person through sexual contact. Infection can cause itching, blisters, and sores around the genitals or rectum. Symptoms may last several days and then go away This is called an outbreak. However, the virus remains in your body, so you may have more outbreaks in the future. The time between outbreaks varies and can be months or years. Genital herpes affects men and women. It is particularly concerning for pregnant women because the virus can be passed to the baby during delivery and can cause serious problems. Genital herpes is also a concern for people who have a weak disease-fighting (immune) system. What are the causes? This condition is caused by the herpes simplex virus (HSV) type 1 or type 2. The virus may spread through:  Sexual contact with an infected person, including vaginal, anal, and oral sex.  Contact with fluid from a herpes sore.  The skin. This means that you can get herpes from an infected partner even if he or she does not have a visible sore or does not know that he or she is infected. What increases the risk? You are more likely to develop this condition if:  You have sex with many partners.  You do not use latex condoms during sex. What are the signs or symptoms? Most people do not have symptoms (asymptomatic) or have mild symptoms that may be mistaken for other skin problems. Symptoms may include:  Small red bumps near the genitals, rectum, or mouth. These bumps turn into blisters and then turn into sores.  Flu-like symptoms, including:  Fever.  Body aches.  Swollen lymph nodes.  Headache.  Painful urination.  Pain and itching in the genital area or rectal area.  Vaginal discharge.  Tingling or shooting pain in the legs and buttocks. Generally, symptoms are more severe and last longer during the first (primary)  outbreak. Flu-like symptoms are also more common during the primary outbreak. How is this diagnosed? Genital herpes may be diagnosed based on:  A physical exam.  Your medical history.  Blood tests.  A test of a fluid sample (culture) from an open sore. How is this treated? There is no cure for this condition, but treatment with antiviral medicines that are taken by mouth (orally) can do the following:  Speed up healing and relieve symptoms.  Help to reduce the spread of the virus to sexual partners.  Limit the chance of future outbreaks, or make future outbreaks shorter.  Lessen symptoms of future outbreaks. Your health care provider may also recommend pain relief medicines, such as aspirin or ibuprofen. Follow these instructions at home: Sexual activity   Do not have sexual contact during active outbreaks.  Practice safe sex. Latex condoms and female condoms may help prevent the spread of the herpes virus. General instructions   Keep the affected areas dry and clean.  Take over-the-counter and prescription medicines only as told by your health care provider.  Avoid rubbing or touching blisters and sores. If you do touch blisters or sores:  Wash your hands thoroughly with soap and water.  Do not touch your eyes afterward.  To help relieve pain or itching, you may take the following actions as directed by your health care provider:  Apply a cold, wet cloth (cold compress) to affected areas 4-6 times a day.  Apply a substance that protects your skin and reduces bleeding (astringent).  Apply a   gel that helps relieve pain around sores (lidocaine gel).  Take a warm, shallow bath that cleans the genital area (sitz bath).  Keep all follow-up visits as told by your health care provider. This is important. How is this prevented?  Use condoms. Although anyone can get genital herpes during sexual contact, even with the use of a condom, a condom can provide some  protection.  Avoid having multiple sexual partners.  Talk with your sexual partner about any symptoms either of you may have. Also, talk with your partner about any history of STIs.  Get tested for STIs before you have sex. Ask your partner to do the same.  Do not have sexual contact if you have symptoms of genital herpes. Contact a health care provider if:  Your symptoms are not improving with medicine.  Your symptoms return.  You have new symptoms.  You have a fever.  You have abdominal pain.  You have redness, swelling, or pain in your eye.  You notice new sores on other parts of your body.  You are a woman and experience bleeding between menstrual periods.  You have had herpes and you become pregnant or plan to become pregnant. Summary  Genital herpes is a common sexually transmitted infection (STI) that is caused by the herpes simplex virus (HSV) type 1 or type 2.  These viruses are most often spread through sexual contact with an infected person.  You are more likely to develop this condition if you have sex with many partners or you have unprotected sex.  Most people do not have symptoms (asymptomatic) or have mild symptoms that may be mistaken for other skin problems. Symptoms occur as outbreaks that may happen months or years apart.  There is no cure for this condition, but treatment with oral antiviral medicines can reduce symptoms, reduce the chance of spreading the virus to a partner, prevent future outbreaks, or shorten future outbreaks. This information is not intended to replace advice given to you by your health care provider. Make sure you discuss any questions you have with your health care provider. Document Released: 10/21/2000 Document Revised: 09/23/2016 Document Reviewed: 09/23/2016 Elsevier Interactive Patient Education  2017 [employer]. Bacterial Vaginosis Bacterial vaginosis is a vaginal infection that occurs when the normal balance of bacteria  in the vagina is disrupted. It results from an overgrowth of certain bacteria. This is the most common vaginal infection among women ages 72-44. Because bacterial vaginosis increases your risk for STIs (sexually transmitted infections), getting treated can help reduce your risk for chlamydia, gonorrhea, herpes, and HIV (human immunodeficiency virus). Treatment is also important for preventing complications in pregnant women, because this condition can cause an early (premature) delivery. What are the causes? This condition is caused by an increase in harmful bacteria that are normally present in small amounts in the vagina. However, the reason that the condition develops is not fully understood. What increases the risk? The following factors may make you more likely to develop this condition:  Having a new sexual partner or multiple sexual partners.  Having unprotected sex.  Douching.  Having an intrauterine device (IUD).  Smoking.  Drug and alcohol abuse.  Taking certain antibiotic medicines.  Being pregnant. You cannot get bacterial vaginosis from toilet seats, bedding, swimming pools, or contact with objects around you. What are the signs or symptoms? Symptoms of this condition include:  Grey or white vaginal discharge. The discharge can also be watery or foamy.  A fish-like odor with discharge, especially  after sexual intercourse or during menstruation.  Itching in and around the vagina.  Burning or pain with urination. Some women with bacterial vaginosis have no signs or symptoms. How is this diagnosed? This condition is diagnosed based on:  Your medical history.  A physical exam of the vagina.  Testing a sample of vaginal fluid under a microscope to look for a large amount of bad bacteria or abnormal cells. Your health care provider may use a cotton swab or a small wooden spatula to collect the sample. How is this treated? This condition is treated with antibiotics.  These may be given as a pill, a vaginal cream, or a medicine that is put into the vagina (suppository). If the condition comes back after treatment, a second round of antibiotics may be needed. Follow these instructions at home: Medicines   Take over-the-counter and prescription medicines only as told by your health care provider.  Take or use your antibiotic as told by your health care provider. Do not stop taking or using the antibiotic even if you start to feel better. General instructions   If you have a female sexual partner, tell her that you have a vaginal infection. She should see her health care provider and be treated if she has symptoms. If you have a female sexual partner, he does not need treatment.  During treatment:  Avoid sexual activity until you finish treatment.  Do not douche.  Avoid alcohol as directed by your health care provider.  Avoid breastfeeding as directed by your health care provider.  Drink enough water and fluids to keep your urine clear or pale yellow.  Keep the area around your vagina and rectum clean.  Wash the area daily with warm water.  Wipe yourself from front to back after using the toilet.  Keep all follow-up visits as told by your health care provider. This is important. How is this prevented?  Do not douche.  Wash the outside of your vagina with warm water only.  Use protection when having sex. This includes latex condoms and dental dams.  Limit how many sexual partners you have. To help prevent bacterial vaginosis, it is best to have sex with just one partner (monogamous).  Make sure you and your sexual partner are tested for STIs.  Wear cotton or cotton-lined underwear.  Avoid wearing tight pants and pantyhose, especially during summer.  Limit the amount of alcohol that you drink.  Do not use any products that contain nicotine or tobacco, such as cigarettes and e-cigarettes. If you need help quitting, ask your health care  provider.  Do not use illegal drugs. Where to find more information:  Centers for Disease Control and Prevention: SolutionApps.co.za  American Sexual Health Association (ASHA): www.ashastd.org  U.S. Department of Health and Health and safety inspector, Office on Women's Health: ConventionalMedicines.si or http://www.anderson-williamson.info/ Contact a health care provider if:  Your symptoms do not improve, even after treatment.  You have more discharge or pain when urinating.  You have a fever.  You have pain in your abdomen.  You have pain during sex.  You have vaginal bleeding between periods. Summary  Bacterial vaginosis is a vaginal infection that occurs when the normal balance of bacteria in the vagina is disrupted.  Because bacterial vaginosis increases your risk for STIs (sexually transmitted infections), getting treated can help reduce your risk for chlamydia, gonorrhea, herpes, and HIV (human immunodeficiency virus). Treatment is also important for preventing complications in pregnant women, because the condition can cause an early (premature)  delivery.  This condition is treated with antibiotic medicines. These may be given as a pill, a vaginal cream, or a medicine that is put into the vagina (suppository). This information is not intended to replace advice given to you by your health care provider. Make sure you discuss any questions you have with your health care provider. Document Released: 10/24/2005 Document Revised: 07/09/2016 Document Reviewed: 07/09/2016 Elsevier Interactive Patient Education  2017 [employer].

## 2017-03-08 ENCOUNTER — Other Ambulatory Visit (HOSPITAL_COMMUNITY)
Admission: RE | Admit: 2017-03-08 | Discharge: 2017-03-08 | Disposition: A | Discharge status: Home / Self Care | Payer: Medicaid Other | Source: Ambulatory Visit | Attending: Adult Health | Admitting: Adult Health

## 2017-03-08 ENCOUNTER — Encounter: Payer: Self-pay | Admitting: Adult Health

## 2017-03-08 ENCOUNTER — Ambulatory Visit (INDEPENDENT_AMBULATORY_CARE_PROVIDER_SITE_OTHER): Payer: Medicaid Other | Admitting: Adult Health

## 2017-03-08 VITALS — BP 138/86 | HR 71 | Ht 66.0 in | Wt 290.0 lb

## 2017-03-08 DIAGNOSIS — F329 Major depressive disorder, single episode, unspecified: Secondary | ICD-10-CM

## 2017-03-08 DIAGNOSIS — F32A Depression, unspecified: Secondary | ICD-10-CM

## 2017-03-08 DIAGNOSIS — Z113 Encounter for screening for infections with a predominantly sexual mode of transmission: Secondary | ICD-10-CM

## 2017-03-08 DIAGNOSIS — Z975 Presence of (intrauterine) contraceptive device: Secondary | ICD-10-CM

## 2017-03-08 DIAGNOSIS — Z124 Encounter for screening for malignant neoplasm of cervix: Secondary | ICD-10-CM

## 2017-03-08 DIAGNOSIS — Z01419 Encounter for gynecological examination (general) (routine) without abnormal findings: Secondary | ICD-10-CM | POA: Diagnosis not present

## 2017-03-08 DIAGNOSIS — Z1321 Encounter for screening for nutritional disorder: Secondary | ICD-10-CM

## 2017-03-08 DIAGNOSIS — Z131 Encounter for screening for diabetes mellitus: Secondary | ICD-10-CM

## 2017-03-08 NOTE — Progress Notes (Signed)
Patient ID: [name], female   DOB: 11/06/1986, 31 y.o.   MRN: [id number] History of Present Illness: [name] is a 31 year old black female in for well woman gyn exam and pap.   Current Medications, Allergies, Past Medical History, Past Surgical History, Family History and Social History were reviewed in [employer] record.     Review of Systems:  Patient denies any headaches, hearing loss, fatigue, blurred vision, shortness of breath, chest pain, abdominal pain, problems with bowel movements, urination, or intercourse. No joint pain or mood swings.   Physical Exam:BP 138/86 (BP Location: Right Arm, Patient Position: Sitting, Cuff Size: Large)   Pulse 71   Ht  (1.676 m)   Wt 290 lb (131.5 kg)   BMI 46.81 kg/m  General:  Well developed, well nourished, no acute distress Skin:  Warm and dry Neck:  Midline trachea, normal thyroid, good ROM, no lymphadenopathy Lungs; Clear to auscultation bilaterally Breast:  No dominant palpable mass, retraction, or nipple discharge Cardiovascular: Regular rate and rhythm Abdomen:  Soft, non tender, no hepatosplenomegaly Pelvic:  External genitalia is normal in appearance, no lesions.  The vagina is normal in appearance. Urethra has no lesions or masses. The cervix is bulbous,+IUD strings at os, pap with HPV and GC/CHL performed.  Uterus is felt to be normal size, shape, and contour.  No adnexal masses or tenderness noted.Bladder is non tender, no masses felt. Extremities/musculoskeletal:  No swelling or varicosities noted, no clubbing or cyanosis Psych:  No mood changes, alert and cooperative,seems happy PHQ 9 score 12. Has appt with [facility] 03/15/17, denies being suicidal.   Impression: 1. Encounter for routine gynecological examination with Papanicolaou smear of cervix   2. Routine cervical smear   3. Depression, unspecified depression type   4. Screening for diabetes mellitus   5. Encounter for vitamin  deficiency screening   6. Screening examination for STD (sexually transmitted disease)   7. IUD (intrauterine device) in place       Plan: Check CBC,CMP,TSH and lipids,A1c and vitamin D,HIV and RPR Physical in 1 year Pap in 3 if normal IUD to be replaced in January  Names given for PCP, Dr Delton See and Dr Jeanice Lim

## 2017-03-09 ENCOUNTER — Telehealth: Payer: Self-pay | Admitting: Adult Health

## 2017-03-09 ENCOUNTER — Encounter: Payer: Self-pay | Admitting: Adult Health

## 2017-03-09 DIAGNOSIS — R7309 Other abnormal glucose: Secondary | ICD-10-CM

## 2017-03-09 DIAGNOSIS — E559 Vitamin D deficiency, unspecified: Secondary | ICD-10-CM

## 2017-03-09 HISTORY — DX: Other abnormal glucose: R73.09

## 2017-03-09 HISTORY — DX: Vitamin D deficiency, unspecified: E55.9

## 2017-03-09 LAB — COMPREHENSIVE METABOLIC PANEL
A/G RATIO: 1.5 (ref 1.2–2.2)
ALK PHOS: 109 IU/L (ref 39–117)
ALT: 20 IU/L (ref 0–32)
AST: 19 IU/L (ref 0–40)
Albumin: 4.1 g/dL (ref 3.5–5.5)
BUN/Creatinine Ratio: 10 (ref 9–23)
BUN: 8 mg/dL (ref 6–20)
Bilirubin Total: <0.2 mg/dL (ref 0.0–1.2)
CALCIUM: 9.3 mg/dL (ref 8.7–10.2)
CO2: 24 mmol/L (ref 18–29)
Chloride: 102 mmol/L (ref 96–106)
Creatinine, Ser: 0.82 mg/dL (ref 0.57–1.00)
GFR calc Af Amer: 111 mL/min/{1.73_m2} (ref >59)
GFR calc non Af Amer: 96 mL/min/{1.73_m2} (ref >59)
GLOBULIN, TOTAL: 2.8 g/dL (ref 1.5–4.5)
Glucose: 89 mg/dL (ref 65–99)
POTASSIUM: 4.3 mmol/L (ref 3.5–5.2)
SODIUM: 140 mmol/L (ref 134–144)
TOTAL PROTEIN: 6.9 g/dL (ref 6.0–8.5)

## 2017-03-09 LAB — VITAMIN D 25 HYDROXY (VIT D DEFICIENCY, FRACTURES): VIT D 25 HYDROXY: 13.3 ng/mL — AB (ref 30.0–100.0)

## 2017-03-09 LAB — CBC
HEMATOCRIT: 39.9 % (ref 34.0–46.6)
Hemoglobin: 13.3 g/dL (ref 11.1–15.9)
MCH: 29.6 pg (ref 26.6–33.0)
MCHC: 33.3 g/dL (ref 31.5–35.7)
MCV: 89 fL (ref 79–97)
PLATELETS: 228 10*3/uL (ref 150–379)
RBC: 4.49 x10E6/uL (ref 3.77–5.28)
RDW: 13.9 % (ref 12.3–15.4)
WBC: 8.2 10*3/uL (ref 3.4–10.8)

## 2017-03-09 LAB — HEMOGLOBIN A1C
ESTIMATED AVERAGE GLUCOSE: 137 mg/dL
Hgb A1c MFr Bld: 6.4 % — ABNORMAL HIGH (ref 4.8–5.6)

## 2017-03-09 LAB — RPR: RPR Ser Ql: NONREACTIVE

## 2017-03-09 LAB — LIPID PANEL
CHOLESTEROL TOTAL: 145 mg/dL (ref 100–199)
Chol/HDL Ratio: 3.8 ratio (ref 0.0–4.4)
HDL: 38 mg/dL — ABNORMAL LOW (ref >39)
LDL CALC: 71 mg/dL (ref 0–99)
Triglycerides: 180 mg/dL — ABNORMAL HIGH (ref 0–149)
VLDL Cholesterol Cal: 36 mg/dL (ref 5–40)

## 2017-03-09 LAB — HIV ANTIBODY (ROUTINE TESTING W REFLEX): HIV SCREEN 4TH GENERATION: NONREACTIVE

## 2017-03-09 LAB — TSH: TSH: 1.22 u[IU]/mL (ref 0.450–4.500)

## 2017-03-09 MED ORDER — CHOLECALCIFEROL 125 MCG (5000 UT) PO CAPS: 5000.0000 [IU] | ORAL_CAPSULE | Freq: Every day | ORAL | Status: DC

## 2017-03-09 NOTE — Telephone Encounter (Signed)
Pt aware of labs, vitamin D is low, so take vitamin D 3 5000 IU daily, and A1c was elevated at 6.4, so cut carbs and increase exercise and will recheck in 3 months, she is aware that she is prediabetic.Other labs OK.Placed in 3 month recall for labs:A1c and vitamin D

## 2017-03-10 LAB — CYTOLOGY - PAP
Chlamydia: NEGATIVE
DIAGNOSIS: NEGATIVE
HPV (WINDOPATH): NOT DETECTED
Neisseria Gonorrhea: NEGATIVE

## 2017-04-01 ENCOUNTER — Other Ambulatory Visit: Payer: Self-pay | Admitting: Obstetrics & Gynecology

## 2017-04-06 ENCOUNTER — Other Ambulatory Visit: Payer: Self-pay | Admitting: Adult Health

## 2017-04-17 ENCOUNTER — Telehealth: Payer: Self-pay | Admitting: *Deleted

## 2017-04-17 NOTE — Telephone Encounter (Signed)
Left message letting pt know she can get Vit D OTC. JSY

## 2017-05-31 ENCOUNTER — Telehealth: Payer: Self-pay | Admitting: *Deleted

## 2017-05-31 NOTE — Telephone Encounter (Signed)
Patient states she has a cloudy discharge and is itching and wants to be seen ASAP. Informed patient we did not have any available appointments at this time but could put her on the wait list.Verbalized understanding.

## 2017-06-07 ENCOUNTER — Ambulatory Visit: Payer: Medicaid Other | Admitting: Obstetrics and Gynecology

## 2017-06-09 ENCOUNTER — Ambulatory Visit: Payer: Medicaid Other | Admitting: Obstetrics & Gynecology

## 2017-06-12 ENCOUNTER — Other Ambulatory Visit: Payer: Self-pay | Admitting: Adult Health

## 2017-06-14 ENCOUNTER — Encounter (HOSPITAL_COMMUNITY): Payer: Self-pay | Admitting: Cardiology

## 2017-06-14 ENCOUNTER — Emergency Department (HOSPITAL_COMMUNITY)
Admission: EM | Admit: 2017-06-14 | Discharge: 2017-06-14 | Disposition: A | Discharge status: Home / Self Care | Payer: Medicaid Other | Attending: Emergency Medicine | Admitting: Emergency Medicine

## 2017-06-14 DIAGNOSIS — H538 Other visual disturbances: Secondary | ICD-10-CM | POA: Insufficient documentation

## 2017-06-14 DIAGNOSIS — Z5321 Procedure and treatment not carried out due to patient leaving prior to being seen by health care provider: Secondary | ICD-10-CM | POA: Insufficient documentation

## 2017-06-14 NOTE — ED Triage Notes (Signed)
Pt left before being seen

## 2017-06-14 NOTE — ED Triage Notes (Signed)
Pt c/o HTN and blurred vision. [facility].  BP 160/110. Clonodine 0.1 mg given at clinic.  HX of HTN but pt states "I ran out of medicine"  BP 155/89 in triage.

## 2017-06-14 NOTE — ED Triage Notes (Signed)
Pt rechecked in triage.  States she is leaving

## 2017-06-20 IMAGING — DX DG CHEST 2V
2 series · 2 of 2 positions shown · non-contrast
Comparison: 07/24/2010

CLINICAL DATA: PT c/o intermittent center sharp chest pain x1 day
with SOB with pain and swelling to right foot. PT states hx of HTN.
PT denies any pain at this time or pain or tenderness upon
palpation.

EXAM:
CHEST  2 VIEW

[chest pa]
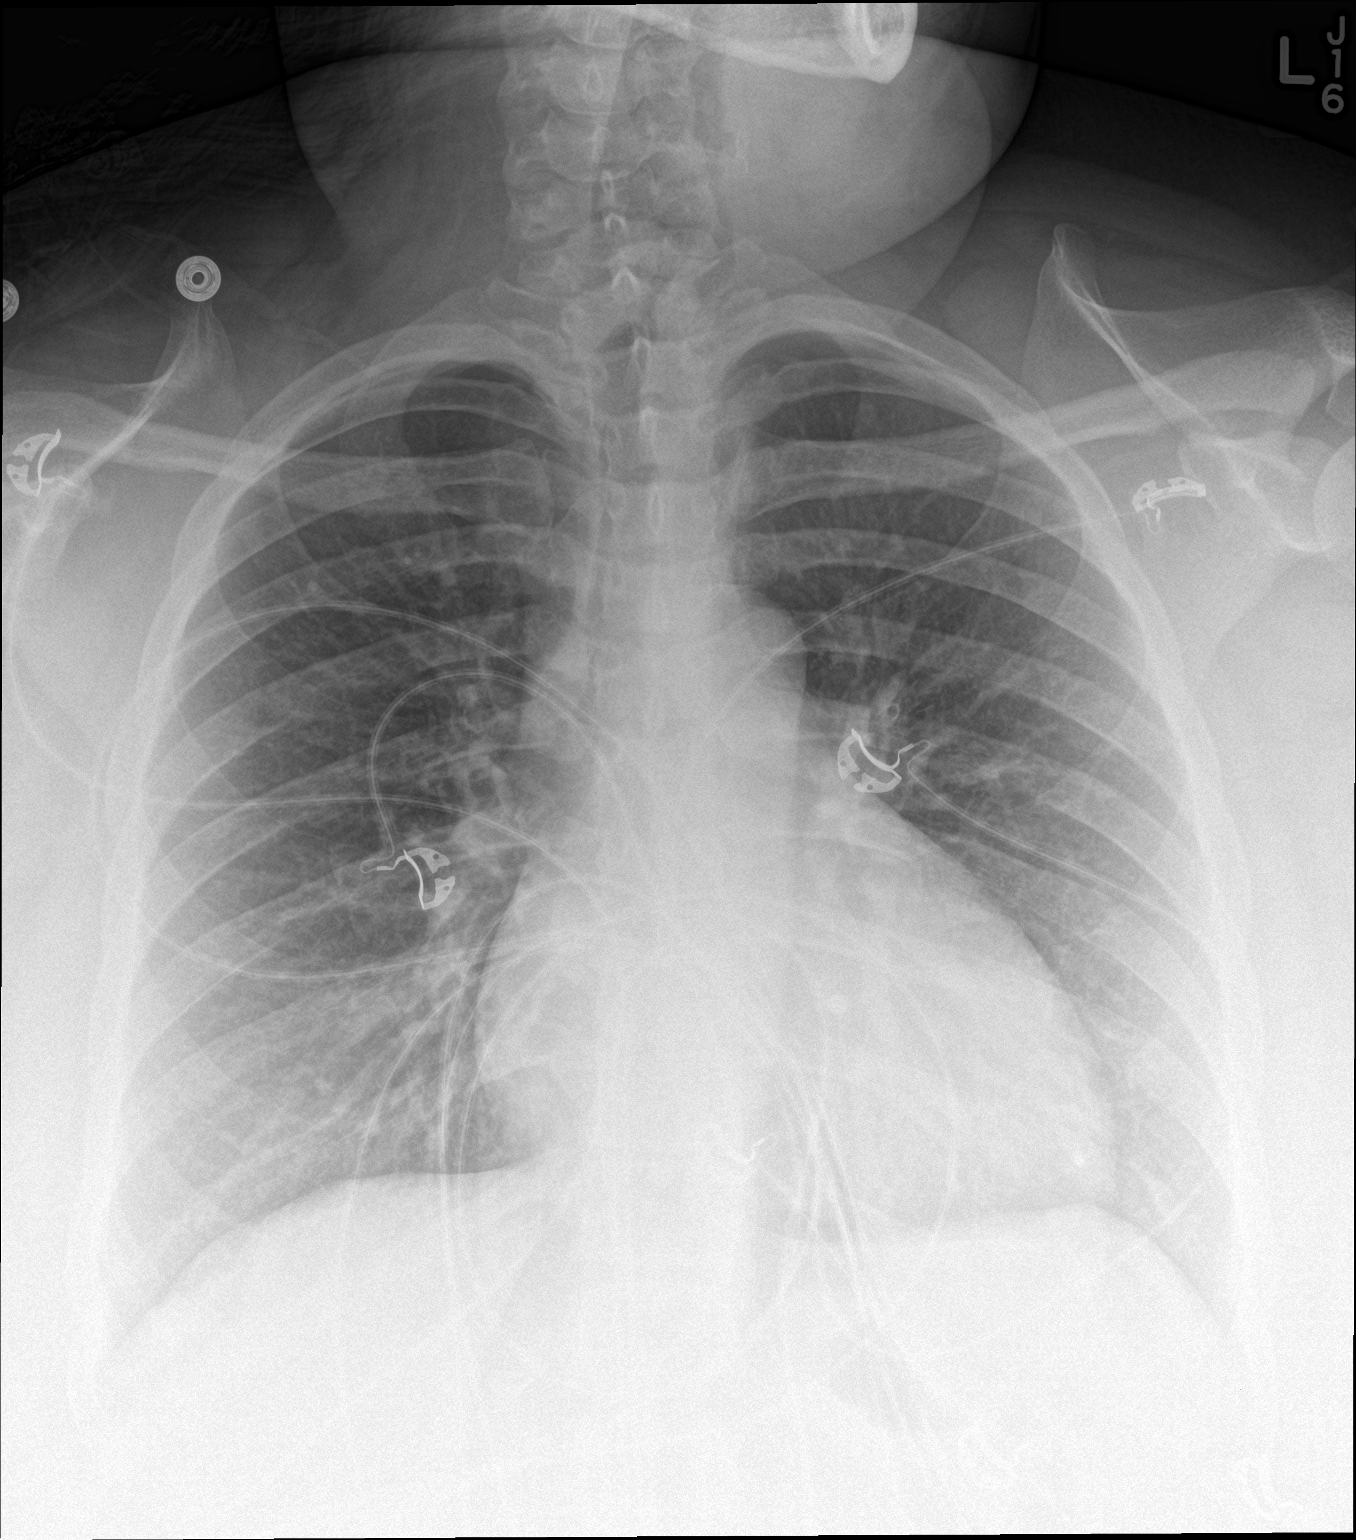

[chest lat]
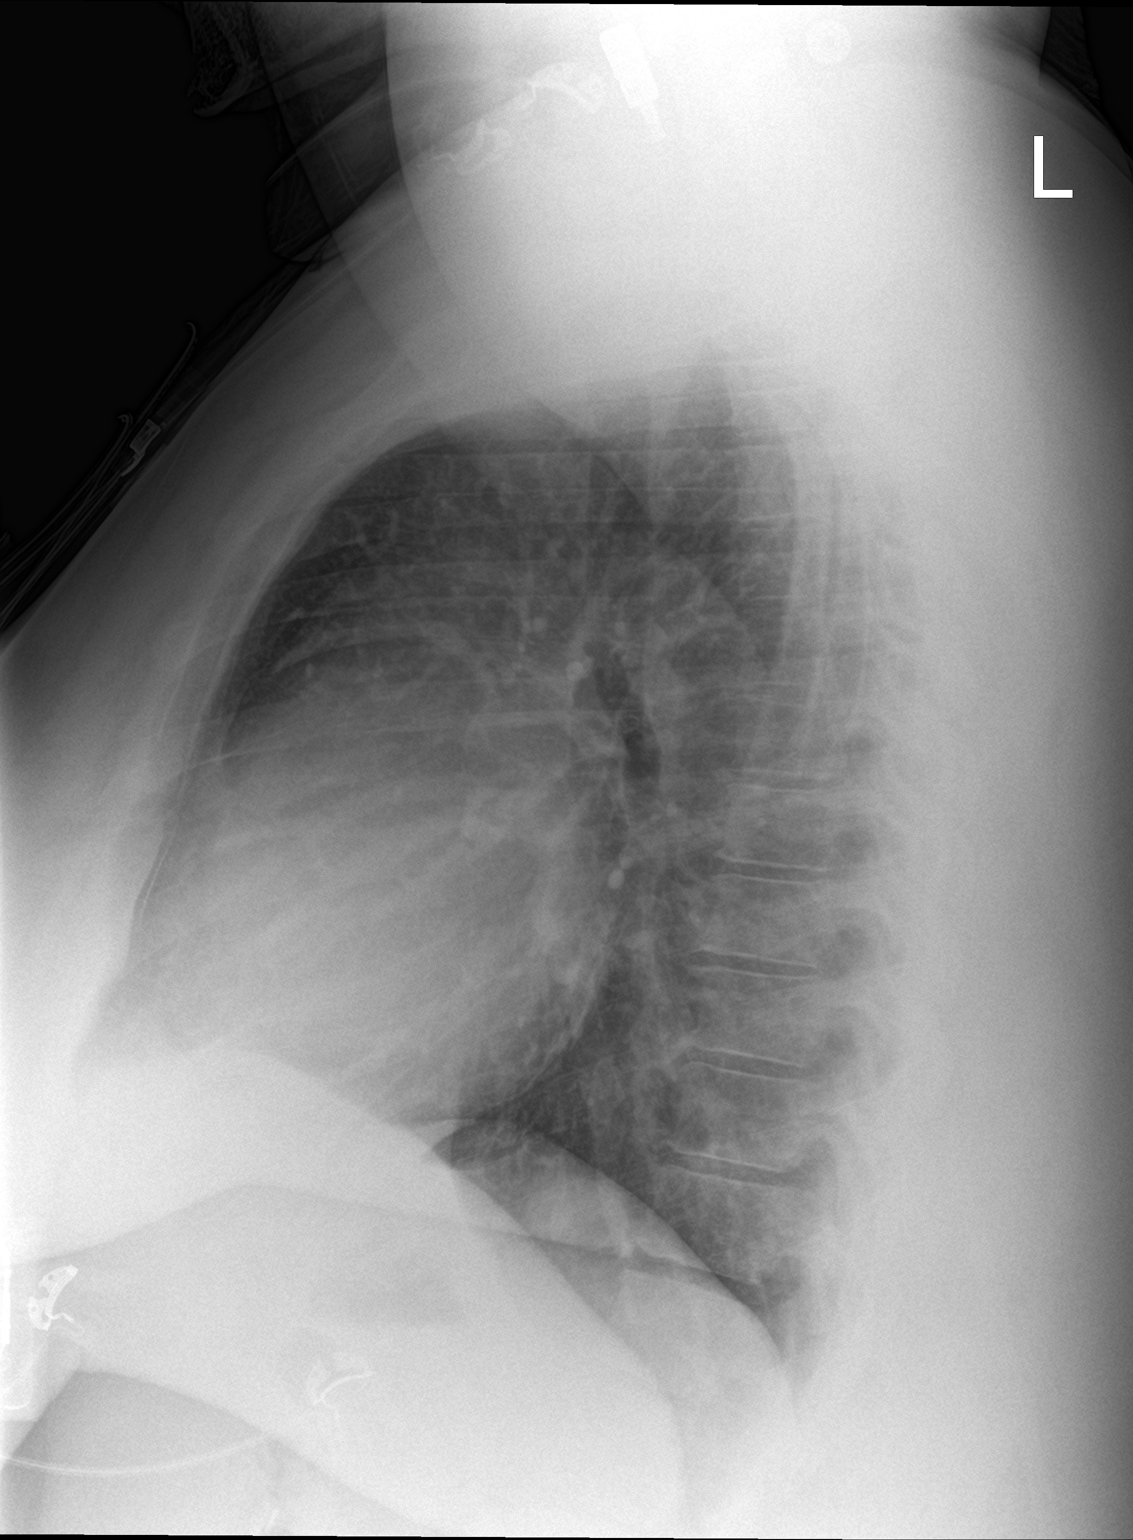

[2 of 2 positions shown; findings below may reference images not displayed]

FINDINGS: Mild enlargement of the cardiac silhouette, stable. No mediastinal
or hilar masses or evidence of adenopathy.

Clear lungs.  No pleural effusion or pneumothorax.

Skeletal structures are unremarkable.
IMPRESSION: No active cardiopulmonary disease.

## 2017-07-17 ENCOUNTER — Other Ambulatory Visit (HOSPITAL_COMMUNITY): Payer: Self-pay | Admitting: Family Medicine

## 2017-07-17 ENCOUNTER — Encounter: Payer: Self-pay | Admitting: Adult Health

## 2017-07-17 ENCOUNTER — Ambulatory Visit (INDEPENDENT_AMBULATORY_CARE_PROVIDER_SITE_OTHER): Payer: Medicaid Other | Admitting: Adult Health

## 2017-07-17 VITALS — BP 148/100 | HR 58 | Ht 67.0 in | Wt 301.5 lb

## 2017-07-17 DIAGNOSIS — Z975 Presence of (intrauterine) contraceptive device: Secondary | ICD-10-CM | POA: Diagnosis not present

## 2017-07-17 DIAGNOSIS — B3731 Acute candidiasis of vulva and vagina: Secondary | ICD-10-CM

## 2017-07-17 DIAGNOSIS — L298 Other pruritus: Secondary | ICD-10-CM | POA: Diagnosis not present

## 2017-07-17 DIAGNOSIS — N898 Other specified noninflammatory disorders of vagina: Secondary | ICD-10-CM | POA: Diagnosis not present

## 2017-07-17 DIAGNOSIS — G629 Polyneuropathy, unspecified: Secondary | ICD-10-CM

## 2017-07-17 DIAGNOSIS — B373 Candidiasis of vulva and vagina: Secondary | ICD-10-CM

## 2017-07-17 LAB — POCT WET PREP (WET MOUNT)
Clue Cells Wet Prep Whiff POC: NEGATIVE
WBC, Wet Prep HPF POC: POSITIVE

## 2017-07-17 MED ORDER — FLUCONAZOLE 150 MG PO TABS: ORAL_TABLET | ORAL | 1 refills | Status: DC

## 2017-07-17 NOTE — Patient Instructions (Signed)
F/U with Dr Selena BattenKim about BP

## 2017-07-17 NOTE — Progress Notes (Signed)
Subjective:     Patient ID: [name], female   DOB: [birth year], 31 y.o.   MRN: [id number]  HPI Lowanda FosterBrittany is a 31 year old black female in complaining of vaginal discharge and itching.Was seen at Dr Elmyra RicksKim's office today and started on Metformin. Her BP was elevated in his office, and she was given med.  Review of Systems  +vaginal discharge +vaginal itching Not currently having sex   Reviewed past medical,surgical, social and family history. Reviewed medications and allergies.     Objective:   Physical Exam BP (!) 148/100 (BP Location: Right Arm, Patient Position: Sitting, Cuff Size: Large)   Pulse (!) 58   Ht 5\' 7"  (1.702 m)   Wt (!) 301 lb 8 oz (136.8 kg)   BMI 47.22 kg/m   Skin warm and dry.Pelvic: external genitalia is normal in appearance no lesions, vagina: tan discharge without odor,urethra has no lesions or masses noted, cervix:smooth and bulbous,IUD in place, uterus: normal size, shape and contour, non tender, no masses felt, adnexa: no masses or tenderness noted. Bladder is non tender and no masses felt. Wet prep: + yeast and  +WBCs. GC/CHL obtained.    Discussed that having elevated blood sugars can increase yeast infections.She has appt with dietician in near future.  Assessment:     1. Yeast infection of the vagina   2. Vaginal discharge   3. Vaginal itching   4.       IUD in place    Plan:     Rx diflucan 150 mg # 2 take 1 now and repeat 1 in 3 days GC/CHL sent F/U prn F/U with Dr Selena BattenKim about BP

## 2017-07-19 LAB — GC/CHLAMYDIA PROBE AMP
CHLAMYDIA, DNA PROBE: NEGATIVE
NEISSERIA GONORRHOEAE BY PCR: NEGATIVE

## 2017-07-26 ENCOUNTER — Ambulatory Visit (HOSPITAL_COMMUNITY)
Admission: RE | Admit: 2017-07-26 | Discharge: 2017-07-26 | Disposition: A | Discharge status: Home / Self Care | Payer: Medicaid Other | Source: Ambulatory Visit | Attending: Family Medicine | Admitting: Family Medicine

## 2017-07-26 ENCOUNTER — Emergency Department (HOSPITAL_COMMUNITY)
Admission: EM | Admit: 2017-07-26 | Discharge: 2017-07-26 | Disposition: A | Discharge status: Home / Self Care | Payer: Medicaid Other | Attending: Emergency Medicine | Admitting: Emergency Medicine

## 2017-07-26 ENCOUNTER — Encounter (HOSPITAL_COMMUNITY): Payer: Self-pay | Admitting: *Deleted

## 2017-07-26 DIAGNOSIS — R109 Unspecified abdominal pain: Secondary | ICD-10-CM | POA: Diagnosis not present

## 2017-07-26 DIAGNOSIS — G629 Polyneuropathy, unspecified: Secondary | ICD-10-CM | POA: Insufficient documentation

## 2017-07-26 DIAGNOSIS — Z5321 Procedure and treatment not carried out due to patient leaving prior to being seen by health care provider: Secondary | ICD-10-CM | POA: Insufficient documentation

## 2017-07-26 LAB — URINALYSIS, ROUTINE W REFLEX MICROSCOPIC
Bacteria, UA: NONE SEEN
Bilirubin Urine: NEGATIVE
Glucose, UA: NEGATIVE mg/dL
Ketones, ur: NEGATIVE mg/dL
Leukocytes, UA: NEGATIVE
Nitrite: NEGATIVE
PROTEIN: NEGATIVE mg/dL
SPECIFIC GRAVITY, URINE: 1.023 (ref 1.005–1.030)
pH: 6 (ref 5.0–8.0)

## 2017-07-26 NOTE — ED Triage Notes (Signed)
Pt c/o bilateral flank pain that radiates around to upper abdomen x 1 week. Denies urinary symptoms, nausea, vomiting, diarrhea, fever.

## 2017-07-26 NOTE — ED Triage Notes (Signed)
Called for room x 1.  

## 2017-07-26 NOTE — ED Triage Notes (Signed)
Pt called for room x 2. No answer. Pt assumed to have left waiting area.

## 2017-09-25 ENCOUNTER — Telehealth: Payer: Self-pay | Admitting: Nutrition

## 2017-09-25 NOTE — Telephone Encounter (Signed)
VM to call and reschedule appt. Provider meeting.

## 2017-09-26 ENCOUNTER — Ambulatory Visit: Payer: Medicaid Other | Admitting: Nutrition

## 2017-11-20 ENCOUNTER — Ambulatory Visit: Payer: Medicaid Other | Admitting: Women's Health

## 2017-11-30 ENCOUNTER — Encounter: Payer: Self-pay | Admitting: Women's Health

## 2017-11-30 ENCOUNTER — Ambulatory Visit (INDEPENDENT_AMBULATORY_CARE_PROVIDER_SITE_OTHER): Payer: Medicaid Other | Admitting: Women's Health

## 2017-11-30 VITALS — BP 150/110 | HR 88 | Ht 67.0 in | Wt 307.5 lb

## 2017-11-30 DIAGNOSIS — Z30433 Encounter for removal and reinsertion of intrauterine contraceptive device: Secondary | ICD-10-CM | POA: Diagnosis not present

## 2017-11-30 DIAGNOSIS — Z30430 Encounter for insertion of intrauterine contraceptive device: Secondary | ICD-10-CM | POA: Diagnosis not present

## 2017-11-30 DIAGNOSIS — Z3202 Encounter for pregnancy test, result negative: Secondary | ICD-10-CM

## 2017-11-30 LAB — POCT URINE PREGNANCY: Preg Test, Ur: NEGATIVE

## 2017-11-30 MED ORDER — LEVONORGESTREL 20 MCG/24HR IU IUD
INTRAUTERINE_SYSTEM | Freq: Once | INTRAUTERINE | Status: AC
  Administered 2017-11-30: 12:00:00 via INTRAUTERINE

## 2017-11-30 NOTE — Progress Notes (Signed)
   IUD REMOVAL & RE-INSERTION Patient name: [name] MRN [id number]  Date of birth: @DOB  Subjective Findings:   @Angel  N Laurence [name] is a 32 y.o. [id number] African American female being seen today for removal of a Mirena IUD and insertion of a Mirena IUD. Her IUD was placed 2013.  She is under the care of Dr.Kim PCP for her HTN, just recently added another med, sees him back 12/12/17. States she went to ED recently w/ LLQ pain, was told she could have ovarian cyst, but they were unable to do u/s. Pain has gotten better, is only occ.   Patient's last menstrual period was 11/21/2017. Last sexual intercourse was unsure Last pap5/2/18. Results were:  neg w/ -HRHPV  The risks and benefits of the method and placement have been thouroughly reviewed with the patient and all questions were answered.  Specifically the patient is aware of failure rate of 11/998, expulsion of the IUD and of possible perforation.  The patient is aware of irregular bleeding due to the method and understands the incidence of irregular bleeding diminishes with time.  Signed copy of informed consent in chart.  Pertinent History Reviewed:   Reviewed past medical,surgical, social, obstetrical and family history.  Reviewed problem list, medications and allergies. Objective Findings & Procedure:    Vitals:   11/30/17 1118  BP: (!) 150/110  Pulse: 88  Weight: (!) 307 lb 8 oz (139.5 kg)  Height: 5\' 7"  (1.702 m)  Body mass index is 48.16 kg/m.  Results for orders placed or performed in visit on 11/30/17 (from the past 24 hour(s))  POCT urine pregnancy   Collection Time: 11/30/17 11:24 AM  Result Value Ref Range   Preg Test, Ur Negative Negative     Time out was performed.  A graves speculum was placed in the vagina.  The cervix was visualized, prepped using Betadine. The strings were visible. They were grasped and the Mirena IUD was easily removed. The cervix was then grasped with a single-tooth tenaculum. The uterus  was found to be anteroflexed and it sounded to 8 cm.  Mirena IUD placed per manufacturer's recommendations without complications. The strings were trimmed to approximately 3 cm.  The patient tolerated the procedure well.   Informal transvaginal sonogram was performed and the proper placement of the IUD was verified. Got LHE in to look at Lt ovary, follicular cysts, no dominant cysts.   Assessment & Plan:   1) Mirena IUD removal & re-insertion The patient was given post procedure instructions, including signs and symptoms of infection and to check for the strings after each menses or each month, and refraining from intercourse or anything in the vagina for 3 days. She was given a Mirena care card with date IUD placed, and date IUD to be removed. She is scheduled for a f/u appointment in 4 weeks.  2) HTN> keep f/u w/ PCP on 12/12/16  Orders Placed This Encounter  Procedures  . POCT urine pregnancy    Follow-up: Return in about 4 weeks (around 12/28/2017) for F/U.  [name], [name] CNM, [facility] 11/30/2017 11:58 AM

## 2017-11-30 NOTE — Addendum Note (Signed)
Addended by: Colen DarlingYOUNG, Benyamin Jeff S on: 11/30/2017 12:05 PM   Modules accepted: Orders

## 2017-11-30 NOTE — Patient Instructions (Signed)
 Nothing in vagina for 3 days (no sex, douching, tampons, etc...)  Check your strings once a month to make sure you can feel them, if you are not able to please let us know  If you develop a fever of 100.4 or more in the next few weeks, or if you develop severe abdominal pain, please let us know  Use a backup method of birth control, such as condoms, for 2 weeks   Levonorgestrel intrauterine device (IUD) What is this medicine? LEVONORGESTREL IUD (LEE voe nor jes trel) is a contraceptive (birth control) device. The device is placed inside the uterus by a healthcare professional. It is used to prevent pregnancy. This device can also be used to treat heavy bleeding that occurs during your period. This medicine may be used for other purposes; ask your health care provider or pharmacist if you have questions. COMMON BRAND NAME(S): Kyleena, LILETTA, Mirena, Skyla What should I tell my health care provider before I take this medicine? They need to know if you have any of these conditions: -abnormal Pap smear -cancer of the breast, uterus, or cervix -diabetes -endometritis -genital or pelvic infection now or in the past -have more than one sexual partner or your partner has more than one partner -heart disease -history of an ectopic or tubal pregnancy -immune system problems -IUD in place -liver disease or tumor -problems with blood clots or take blood-thinners -seizures -use intravenous drugs -uterus of unusual shape -vaginal bleeding that has not been explained -an unusual or allergic reaction to levonorgestrel, other hormones, silicone, or polyethylene, medicines, foods, dyes, or preservatives -pregnant or trying to get pregnant -breast-feeding How should I use this medicine? This device is placed inside the uterus by a health care professional. Talk to your pediatrician regarding the use of this medicine in children. Special care may be needed. Overdosage: If you think you have  taken too much of this medicine contact a poison control center or emergency room at once. NOTE: This medicine is only for you. Do not share this medicine with others. What if I miss a dose? This does not apply. Depending on the brand of device you have inserted, the device will need to be replaced every 3 to 5 years if you wish to continue using this type of birth control. What may interact with this medicine? Do not take this medicine with any of the following medications: -amprenavir -bosentan -fosamprenavir This medicine may also interact with the following medications: -aprepitant -armodafinil -barbiturate medicines for inducing sleep or treating seizures -bexarotene -boceprevir -griseofulvin -medicines to treat seizures like carbamazepine, ethotoin, felbamate, oxcarbazepine, phenytoin, topiramate -modafinil -pioglitazone -rifabutin -rifampin -rifapentine -some medicines to treat HIV infection like atazanavir, efavirenz, indinavir, lopinavir, nelfinavir, tipranavir, ritonavir -St. John's wort -warfarin This list may not describe all possible interactions. Give your health care provider a list of all the medicines, herbs, non-prescription drugs, or dietary supplements you use. Also tell them if you smoke, drink alcohol, or use illegal drugs. Some items may interact with your medicine. What should I watch for while using this medicine? Visit your doctor or health care professional for regular check ups. See your doctor if you or your partner has sexual contact with others, becomes HIV positive, or gets a sexual transmitted disease. This product does not protect you against HIV infection (AIDS) or other sexually transmitted diseases. You can check the placement of the IUD yourself by reaching up to the top of your vagina with clean fingers to feel the threads. Do   not pull on the threads. It is a good habit to check placement after each menstrual period. Call your doctor right away if  you feel more of the IUD than just the threads or if you cannot feel the threads at all. The IUD may come out by itself. You may become pregnant if the device comes out. If you notice that the IUD has come out use a backup birth control method like condoms and call your health care provider. Using tampons will not change the position of the IUD and are okay to use during your period. This IUD can be safely scanned with magnetic resonance imaging (MRI) only under specific conditions. Before you have an MRI, tell your healthcare provider that you have an IUD in place, and which type of IUD you have in place. What side effects may I notice from receiving this medicine? Side effects that you should report to your doctor or health care professional as soon as possible: -allergic reactions like skin rash, itching or hives, swelling of the face, lips, or tongue -fever, flu-like symptoms -genital sores -high blood pressure -no menstrual period for 6 weeks during use -pain, swelling, warmth in the leg -pelvic pain or tenderness -severe or sudden headache -signs of pregnancy -stomach cramping -sudden shortness of breath -trouble with balance, talking, or walking -unusual vaginal bleeding, discharge -yellowing of the eyes or skin Side effects that usually do not require medical attention (report to your doctor or health care professional if they continue or are bothersome): -acne -breast pain -change in sex drive or performance -changes in weight -cramping, dizziness, or faintness while the device is being inserted -headache -irregular menstrual bleeding within first 3 to 6 months of use -nausea This list may not describe all possible side effects. Call your doctor for medical advice about side effects. You may report side effects to FDA at 1-800-FDA-1088. Where should I keep my medicine? This does not apply. NOTE: This sheet is a summary. It may not cover all possible information. If you have  questions about this medicine, talk to your doctor, pharmacist, or health care provider.  2018 Elsevier/Gold Standard (2016-08-05 14:14:56)  

## 2017-12-01 ENCOUNTER — Other Ambulatory Visit: Payer: Self-pay | Admitting: Adult Health

## 2017-12-04 ENCOUNTER — Emergency Department (HOSPITAL_COMMUNITY)
Admission: EM | Admit: 2017-12-04 | Discharge: 2017-12-04 | Disposition: A | Discharge status: Home / Self Care | Payer: Medicaid Other | Attending: Emergency Medicine | Admitting: Emergency Medicine

## 2017-12-04 ENCOUNTER — Other Ambulatory Visit: Payer: Self-pay

## 2017-12-04 ENCOUNTER — Encounter (HOSPITAL_COMMUNITY): Payer: Self-pay | Admitting: Emergency Medicine

## 2017-12-04 ENCOUNTER — Emergency Department (HOSPITAL_COMMUNITY): Payer: Medicaid Other

## 2017-12-04 DIAGNOSIS — Z79899 Other long term (current) drug therapy: Secondary | ICD-10-CM | POA: Insufficient documentation

## 2017-12-04 DIAGNOSIS — G43109 Migraine with aura, not intractable, without status migrainosus: Secondary | ICD-10-CM | POA: Diagnosis not present

## 2017-12-04 DIAGNOSIS — E119 Type 2 diabetes mellitus without complications: Secondary | ICD-10-CM | POA: Insufficient documentation

## 2017-12-04 DIAGNOSIS — I10 Essential (primary) hypertension: Secondary | ICD-10-CM | POA: Insufficient documentation

## 2017-12-04 DIAGNOSIS — F17210 Nicotine dependence, cigarettes, uncomplicated: Secondary | ICD-10-CM | POA: Diagnosis not present

## 2017-12-04 DIAGNOSIS — R51 Headache: Secondary | ICD-10-CM | POA: Diagnosis present

## 2017-12-04 LAB — CBC
HCT: 41.7 % (ref 36.0–46.0)
HEMOGLOBIN: 13.6 g/dL (ref 12.0–15.0)
MCH: 29.6 pg (ref 26.0–34.0)
MCHC: 32.6 g/dL (ref 30.0–36.0)
MCV: 90.8 fL (ref 78.0–100.0)
Platelets: 285 10*3/uL (ref 150–400)
RBC: 4.59 MIL/uL (ref 3.87–5.11)
RDW: 13.6 % (ref 11.5–15.5)
WBC: 8.5 10*3/uL (ref 4.0–10.5)

## 2017-12-04 LAB — BASIC METABOLIC PANEL
Anion gap: 15 (ref 5–15)
BUN: 10 mg/dL (ref 6–20)
CHLORIDE: 106 mmol/L (ref 101–111)
CO2: 21 mmol/L — ABNORMAL LOW (ref 22–32)
Calcium: 9.6 mg/dL (ref 8.9–10.3)
Creatinine, Ser: 0.66 mg/dL (ref 0.44–1.00)
GFR calc Af Amer: >60 mL/min (ref >60)
GLUCOSE: 125 mg/dL — AB (ref 65–99)
POTASSIUM: 3.3 mmol/L — AB (ref 3.5–5.1)
Sodium: 142 mmol/L (ref 135–145)

## 2017-12-04 LAB — CBG MONITORING, ED: GLUCOSE-CAPILLARY: 123 mg/dL — AB (ref 65–99)

## 2017-12-04 MED ORDER — METOCLOPRAMIDE HCL 10 MG PO TABS: 10.0000 mg | ORAL_TABLET | Freq: Once | ORAL | Status: DC

## 2017-12-04 MED ORDER — SODIUM CHLORIDE 0.9 % IV BOLUS (SEPSIS)
1000.0000 mL | Freq: Once | INTRAVENOUS | Status: AC
  Administered 2017-12-04: 1000 mL via INTRAVENOUS

## 2017-12-04 MED ORDER — DIPHENHYDRAMINE HCL 50 MG/ML IJ SOLN
50.0000 mg | Freq: Once | INTRAMUSCULAR | Status: AC
Start: 2017-12-04 — End: 2017-12-04
  Administered 2017-12-04: 50 mg via INTRAVENOUS
  Filled 2017-12-04: qty 1

## 2017-12-04 MED ORDER — DEXAMETHASONE SODIUM PHOSPHATE 4 MG/ML IJ SOLN
10.0000 mg | Freq: Once | INTRAMUSCULAR | Status: AC
Start: 2017-12-04 — End: 2017-12-04
  Administered 2017-12-04: 10 mg via INTRAVENOUS
  Filled 2017-12-04: qty 3

## 2017-12-04 MED ORDER — SODIUM CHLORIDE 0.9 % IV SOLN
INTRAVENOUS | Status: DC
  Administered 2017-12-04: 16:00:00 via INTRAVENOUS

## 2017-12-04 MED ORDER — METOCLOPRAMIDE HCL 5 MG/ML IJ SOLN
10.0000 mg | Freq: Once | INTRAMUSCULAR | Status: AC
  Administered 2017-12-04: 10 mg via INTRAVENOUS
  Filled 2017-12-04: qty 2

## 2017-12-04 NOTE — ED Triage Notes (Signed)
Headache started today.  Pt seen at pcp this morning at bp was 166/120.  Pt takes bp medications and states she has taken her medications. Pt was given an extra hydralazine.

## 2017-12-04 NOTE — ED Provider Notes (Signed)
[facility] EMERGENCY DEPARTMENT Provider Note   CSN: [id number] Arrival date & time: 12/04/17  1430     History   Chief Complaint Chief Complaint  Patient presents with  . Headache    HPI [name] is a 32 y.o. female.  Patient with long-standing history of hypertension since she was a teenager.  Patient with long-standing history of intermittent headaches since she was a teenager as well.  Patient was seen in Drs. Kim's office today by the nurse practitioner.  For follow-up of her blood pressure.  They have been making adjustments all month.  Her blood pressure was elevated there and they have adjusted her hydralazine to 100 mg a day.  Patient is also had a headache for 1 week bilateral forehead also mostly with the right eye some visual changes with the right eye and some photophobia with the right eye.  Some nausea and vomiting.  Has vomited 2 times and nausea is been constant.  Patient also with a complaint for several weeks of intermittent bilateral flank pain.  But her main concern today is the headache.  Patient states she has had headaches on and off since she was a teenager and most of the time they are similar to this 1.  Patient states that head pain is 10 out of 10.      Past Medical History:  Diagnosis Date  . Abnormal vaginal bleeding   . Anxiety   . Bronchitis   . Depression   . Depression   . Diabetes mellitus without complication (HCC)   . Elevated hemoglobin A1c 03/09/2017   Cut carbs increase exercise and recheck in 3 months  . Gallstone   . Headache(784.0)   . 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 History of kidney stones   . Hypertension   . IUD (intrauterine device) in place 03/01/2013  . Neuropathy   . Obese   . Polycystic ovarian syndrome   . PTSD (post-traumatic stress disorder)   . PTSD (post-traumatic stress disorder)   . Sleep apnea   . Trichomonas contact, treated   . Vaginal discharge 12/01/2015  . Vitamin D deficiency  03/09/2017   Take 5000 IU vitamin D 3 daily  . Yeast infection 03/01/2013    Patient Active Problem List   Diagnosis Date Noted  . Vitamin D deficiency 03/09/2017  . Elevated hemoglobin A1c 03/09/2017  . 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 Depression 12/22/2016  . Hematuria 12/22/2016  . BV (bacterial vaginosis) 12/01/2015  . Encounter for insertion of mirena IUD 03/01/2013  . Yeast infection 03/01/2013  . Sleep apnea 02/28/2013  . Essential hypertension, benign 02/14/2013  . Gallstone     Past Surgical History:  Procedure Laterality Date  . CESAREAN SECTION  09/24/2012   Procedure: CESAREAN SECTION;  Surgeon: Willodean Rosenthal, MD;  Location: [facility];  Service: Gynecology;  Laterality: N/A;  . CHOLECYSTECTOMY    . CHOLECYSTECTOMY N/A 04/05/2013   Procedure: LAPAROSCOPIC CHOLECYSTECTOMY;  Surgeon: Dalia Heading, MD;  Location: [facility];  Service: General;  Laterality: N/A;  . TONSILLECTOMY      OB History    Gravida Para Term Preterm AB Living   1 1 1  0 0 1   SAB TAB Ectopic Multiple Live Births   0 0 0 0 1       Home Medications    Prior to Admission medications   Medication Sig Start Date End Date Taking? Authorizing Provider  acetaminophen (TYLENOL)  500 MG tablet Take 1,000 mg by mouth as needed.    [provider]  amLODipine (NORVASC) 5 MG tablet Take 5 mg by mouth daily.    [provider]  Cholecalciferol 5000 units capsule Take 1 capsule (5,000 Units total) by mouth daily. 03/09/17   Adline PotterGriffin, Jennifer A, NP  clonazePAM (KLONOPIN) 1 MG tablet Take 1 mg by mouth 2 (two) times daily.    [provider]  cyclobenzaprine (FLEXERIL) 10 MG tablet TAKE ONE TABLET BY MOUTH EVERY 6 HOURS AS NEEDED FOR MUSCLE SPASM 01/23/17   Lazaro ArmsEure, Luther H, MD  DULoxetine (CYMBALTA) 60 MG capsule Take 60 mg by mouth daily.    [provider]  escitalopram (LEXAPRO) 20 MG tablet TAKE ONE TABLET BY MOUTH ONCE DAILY 04/04/17   Lazaro ArmsEure, Luther H, MD  guanFACINE  (TENEX) 1 MG tablet Take 1 mg by mouth at bedtime.    [provider]  hydrALAZINE (APRESOLINE) 50 MG tablet Take 50 mg by mouth 2 (two) times daily.    [provider]  levonorgestrel (MIRENA) 20 MCG/24HR IUD 1 each by Intrauterine route once.    [provider]  promethazine (PHENERGAN) 25 MG tablet Take 1 tablet (25 mg total) by mouth every 6 (six) hours as needed. 04/23/16   Donnetta Hutchingook, Brian, MD  valACYclovir (VALTREX) 1000 MG tablet Take 1 tablet (1,000 mg total) by mouth daily. 02/10/17   Cheral MarkerBooker, Kimberly R, CNM  zolpidem (AMBIEN) 10 MG tablet Take 1 tablet (10 mg total) by mouth at bedtime as needed for sleep. Patient taking differently: Take 10 mg by mouth at bedtime.  03/01/16 04/23/16  Lazaro ArmsEure, Luther H, MD  zolpidem (AMBIEN) 10 MG tablet TAKE ONE TABLET BY MOUTH AT BEDTIME AS NEEDED FOR SLEEP 10/24/16   Lazaro ArmsEure, Luther H, MD    Family History Family History  Problem Relation Age of Onset  . Diabetes Mother   . Asthma Brother   . Heart murmur Brother   . Crohn's disease Brother   . Diabetes Maternal Grandmother   . Cancer - Lung Maternal Grandmother   . Diabetes Maternal Grandfather   . Hypertension Maternal Grandfather   . Stroke Maternal Grandfather   . COPD Paternal Grandfather   . Diabetes Maternal Aunt   . Hypertension Maternal Aunt   . Diabetes Maternal Uncle   . Congestive Heart Failure Maternal Uncle   . Cancer Paternal Aunt        uterine    Social History Social History   Tobacco Use  . Smoking status: Current Some Day Smoker    Years: 3.00    Types: Cigarettes    Last attempt to quit: 01/17/2012    Years since quitting: 5.8  . Smokeless tobacco: Never Used  Substance Use Topics  . Alcohol use: Yes    Comment: occasionally   . Drug use: No     Allergies   Patient has no known allergies.   Review of Systems Review of Systems  Constitutional: Negative for fever.  HENT: Negative for congestion.   Eyes: Positive for photophobia and  visual disturbance.  Respiratory: Negative for shortness of breath.   Cardiovascular: Negative for chest pain.  Gastrointestinal: Negative for abdominal pain.  Genitourinary: Positive for flank pain.  Musculoskeletal: Negative for back pain.  Skin: Negative for rash.  Neurological: Positive for numbness and headaches. Negative for seizures, syncope, speech difficulty and weakness.  Hematological: Does not bruise/bleed easily.  Psychiatric/Behavioral: Negative for confusion.     Physical Exam  Updated Vital Signs BP 125/80   Pulse (!) 107   Temp 98.4 F (36.9 C) (Oral)   Resp 16   Ht 1.702 m (5\' 7" )   Wt (!) 139.3 kg (307 lb)   LMP 11/21/2017   SpO2 99%   BMI 48.08 kg/m   Physical Exam  Constitutional: She is oriented to person, place, and time. She appears well-developed and well-nourished. No distress.  HENT:  Head: Normocephalic and atraumatic.  Mouth/Throat: Oropharynx is clear and moist.  Eyes: Conjunctivae and EOM are normal. Pupils are equal, round, and reactive to light.  Neck: Normal range of motion. Neck supple.  Cardiovascular: Normal rate, regular rhythm and normal heart sounds.  Pulmonary/Chest: Effort normal and breath sounds normal. No respiratory distress.  Abdominal: Soft. Bowel sounds are normal. There is no tenderness.  Musculoskeletal: Normal range of motion.  Neurological: She is alert and oriented to person, place, and time. No cranial nerve deficit or sensory deficit. She exhibits normal muscle tone. Coordination normal.  Skin: Skin is warm.  Nursing note and vitals reviewed.    ED Treatments / Results  Labs (all labs ordered are listed, but only abnormal results are displayed) Labs Reviewed  BASIC METABOLIC PANEL - Abnormal; Notable for the following components:      Result Value   Potassium 3.3 (*)    CO2 21 (*)    Glucose, Bld 125 (*)    All other components within normal limits  CBG MONITORING, ED - Abnormal; Notable for the following  components:   Glucose-Capillary 123 (*)    All other components within normal limits  CBC    EKG  EKG Interpretation None       Radiology Ct Head Wo Contrast  Result Date: 12/04/2017 CLINICAL DATA:  32 year old female with acute headache today. EXAM: CT HEAD WITHOUT CONTRAST TECHNIQUE: Contiguous axial images were obtained from the base of the skull through the vertex without intravenous contrast. COMPARISON:  04/30/2007 CT FINDINGS: Brain: No evidence of acute infarction, hemorrhage, hydrocephalus, extra-axial collection or mass lesion/mass effect. Vascular: No hyperdense vessel or unexpected calcification. Skull: Normal. Negative for fracture or focal lesion. Sinuses/Orbits: No acute finding. Other: None. IMPRESSION: Unremarkable noncontrast head CT. Electronically Signed   By: Harmon Pier M.D.   On: 12/04/2017 16:50    Procedures Procedures (including critical care time)  Medications Ordered in ED Medications  0.9 %  sodium chloride infusion ( Intravenous New Bag/Given 12/04/17 1618)  sodium chloride 0.9 % bolus 1,000 mL (0 mLs Intravenous Stopped 12/04/17 1800)  dexamethasone (DECADRON) injection 10 mg (10 mg Intravenous Given 12/04/17 1614)  diphenhydrAMINE (BENADRYL) injection 50 mg (50 mg Intravenous Given 12/04/17 1613)  metoCLOPramide (REGLAN) injection 10 mg (10 mg Intravenous Given 12/04/17 1613)     Initial Impression / Assessment and Plan / ED Course  I have reviewed the triage vital signs and the nursing notes.  Pertinent labs & imaging results that were available during my care of the patient were reviewed by me and considered in my medical decision making (see chart for details).     Head CT negative.  Blood pressure shows improvement with treatment for migraine cocktail.  Patient does have a new medication orders to help control her blood pressure after her visit with her primary care doctor today.  Head CT done to rule out any evidence of head bleed.  Symptoms very  suggestive of a long-standing history of migraine headaches.  Suspect that probably is either migraine equivalent or migraine headaches.  Patient given migraine cocktail 1 L of fluid Reglan Decadron and Benadryl.  Patient got some rest and the headache is improving but has not resolved.  She is starting to show some signs of improvement.  Patient will be discharged home rest in her bed to make appointment to follow-up with her primary care doctor for further treatment of the high blood pressure and evaluation of the high blood pressure.  And also given a referral to neurology if needed to follow-up for the headaches.  Final Clinical Impressions(s) / ED Diagnoses   Final diagnoses:  Essential hypertension  Migraine equivalent    ED Discharge Orders    None       Vanetta Mulders, MD 12/04/17 1901

## 2017-12-04 NOTE — ED Notes (Signed)
ED Provider at bedside. 

## 2017-12-04 NOTE — Discharge Instructions (Signed)
Follow-up with Dr. Elmyra RicksKim's office for follow-up of the blood pressure.  And for discussion about the abdominal pain has been ongoing for several weeks.  Today's workup for the headaches with a negative head CT.  The long history of headaches suggestive of migraines most probably is a migraine type headache.  Go home today and rest.  Referral information provided to neurology as needed.  Return for any new or worse symptoms.  Continue take your blood pressure medicine as per today's adjustments that were made in Dr. Elmyra RicksKim's office.

## 2017-12-05 MED ORDER — CHOLECALCIFEROL 125 MCG (5000 UT) PO CAPS: 5000.0000 [IU] | ORAL_CAPSULE | Freq: Every day | ORAL | Status: DC

## 2017-12-28 ENCOUNTER — Ambulatory Visit (INDEPENDENT_AMBULATORY_CARE_PROVIDER_SITE_OTHER): Payer: Medicaid Other | Admitting: Women's Health

## 2017-12-28 ENCOUNTER — Encounter: Payer: Self-pay | Admitting: Women's Health

## 2017-12-28 ENCOUNTER — Ambulatory Visit: Payer: Medicaid Other | Admitting: Women's Health

## 2017-12-28 VITALS — BP 158/100 | HR 75 | Ht 67.0 in | Wt 303.5 lb

## 2017-12-28 DIAGNOSIS — N760 Acute vaginitis: Secondary | ICD-10-CM

## 2017-12-28 DIAGNOSIS — Z30431 Encounter for routine checking of intrauterine contraceptive device: Secondary | ICD-10-CM

## 2017-12-28 DIAGNOSIS — N898 Other specified noninflammatory disorders of vagina: Secondary | ICD-10-CM

## 2017-12-28 DIAGNOSIS — B9689 Other specified bacterial agents as the cause of diseases classified elsewhere: Secondary | ICD-10-CM | POA: Diagnosis not present

## 2017-12-28 DIAGNOSIS — I10 Essential (primary) hypertension: Secondary | ICD-10-CM | POA: Diagnosis not present

## 2017-12-28 LAB — POCT WET PREP (WET MOUNT)
Clue Cells Wet Prep Whiff POC: POSITIVE
Trichomonas Wet Prep HPF POC: ABSENT

## 2017-12-28 MED ORDER — METRONIDAZOLE 0.75 % VA GEL: 1.0000 | Freq: Every day | VAGINAL | 0 refills | Status: DC

## 2017-12-28 NOTE — Progress Notes (Signed)
   GYN VISIT Patient name: [name] MRN [id number]  Date of birth: 10-06-1986 Chief Complaint:   Follow-up (on IUD)  History of Present Illness:   Angel DolphinBrittany N Wingerter is a 32 y.o. [id number] African American female being seen today for IUD check. Had Mirena placed 11/30/17.   No pain or problems since insertion. Bleeding stopped about 1wk after insertion. No pain w/ sex. Tx for BV about 2wks ago, wants to make sure it's gone, still has odor.  Under care of PCP Dr. Selena BattenKim for HTN, trying to get meds adjusted. Goes back early March.    Patient's last menstrual period was 11/30/2017. The current method of family planning is IUD. Last pap 03/08/17. Results were:  neg w/ -HRHPV Review of Systems:   Pertinent items are noted in HPI Denies fever/chills, dizziness, headaches, visual disturbances, fatigue, shortness of breath, chest pain, abdominal pain, vomiting, abnormal vaginal discharge/itching/odor/irritation, problems with periods, bowel movements, urination, or intercourse unless otherwise stated above.  Pertinent History Reviewed:  Reviewed past medical,surgical, social, obstetrical and family history.  Reviewed problem list, medications and allergies. Physical Assessment:   Vitals:   12/28/17 1534  BP: (!) 158/100  Pulse: 75  Weight: (!) 303 lb 8 oz (137.7 kg)  Height: 5\' 7"  (1.702 m)  Body mass index is 47.53 kg/m.       Physical Examination:   General appearance: alert, well appearing, and in no distress  Mental status: alert, oriented to person, place, and time  Skin: warm & dry   Cardiovascular: normal heart rate noted  Respiratory: normal respiratory effort, no distress  Abdomen: soft, non-tender   Pelvic: VULVA: normal appearing vulva with no masses, tenderness or lesions, VAGINA: normal appearing vagina with normal color and malodorous discharge, no lesions, CERVIX: normal appearing cervix without discharge or lesions, IUD strings visible and tucked behind cx  Extremities:  no edema   Results for orders placed or performed in visit on 12/28/17 (from the past 24 hour(s))  POCT Wet Prep Mellody Drown([place] [place])   Collection Time: 12/28/17  3:51 PM  Result Value Ref Range   Source Wet Prep POC vaginal    WBC, Wet Prep HPF POC few    Bacteria Wet Prep HPF POC Few Few   BACTERIA WET PREP MORPHOLOGY POC     Clue Cells Wet Prep HPF POC Few (A) None   Clue Cells Wet Prep Whiff POC Positive Whiff    Yeast Wet Prep HPF POC None    KOH Wet Prep POC     Trichomonas Wet Prep HPF POC Absent Absent    Assessment & Plan:  1) Mirena IUD check> check strings monthly  2) HTN> under care of PCP, f/u as scheduled  3) BV> rx metrogel  Meds:  Meds ordered this encounter  Medications  . metroNIDAZOLE (METROGEL VAGINAL) 0.75 % vaginal gel    Sig: Place 1 Applicatorful vaginally at bedtime. X 5 nights, no alcohol or sex while using    Dispense:  70 g    Refill:  0    Order Specific Question:   Supervising Provider    Answer:   Duane LopeEURE, LUTHER H [[id number]]    Orders Placed This Encounter  Procedures  . POCT Wet Prep [facility]([place] [place])    Return for after 5/2 for physical.  Cheral MarkerKimberly R Booker CNM, [facility]NP-BC 12/28/2017 3:52 PM

## 2018-01-16 ENCOUNTER — Telehealth: Payer: Self-pay | Admitting: Nutrition

## 2018-01-16 NOTE — Telephone Encounter (Signed)
Vm left to cal and schedule appt

## 2018-02-14 ENCOUNTER — Other Ambulatory Visit: Payer: Self-pay | Admitting: Women's Health

## 2018-03-13 ENCOUNTER — Other Ambulatory Visit: Payer: Self-pay

## 2018-03-13 ENCOUNTER — Emergency Department (HOSPITAL_COMMUNITY)
Admission: EM | Admit: 2018-03-13 | Discharge: 2018-03-14 | Disposition: A | Discharge status: Home / Self Care | Payer: Medicaid Other | Attending: Emergency Medicine | Admitting: Emergency Medicine

## 2018-03-13 ENCOUNTER — Encounter (HOSPITAL_COMMUNITY): Payer: Self-pay | Admitting: *Deleted

## 2018-03-13 DIAGNOSIS — I10 Essential (primary) hypertension: Secondary | ICD-10-CM | POA: Insufficient documentation

## 2018-03-13 DIAGNOSIS — Z79899 Other long term (current) drug therapy: Secondary | ICD-10-CM | POA: Insufficient documentation

## 2018-03-13 DIAGNOSIS — G43009 Migraine without aura, not intractable, without status migrainosus: Secondary | ICD-10-CM

## 2018-03-13 DIAGNOSIS — R51 Headache: Secondary | ICD-10-CM | POA: Diagnosis present

## 2018-03-13 DIAGNOSIS — F17210 Nicotine dependence, cigarettes, uncomplicated: Secondary | ICD-10-CM | POA: Diagnosis not present

## 2018-03-13 MED ORDER — SODIUM CHLORIDE 0.9 % IV BOLUS
1000.0000 mL | Freq: Once | INTRAVENOUS | Status: AC
  Administered 2018-03-14: 1000 mL via INTRAVENOUS

## 2018-03-13 MED ORDER — DIPHENOXYLATE-ATROPINE 2.5-0.025 MG PO TABS: 2.0000 | ORAL_TABLET | Freq: Four times a day (QID) | ORAL | 0 refills | Status: DC | PRN

## 2018-03-13 MED ORDER — PROCHLORPERAZINE EDISYLATE 10 MG/2ML IJ SOLN
10.0000 mg | Freq: Once | INTRAMUSCULAR | Status: AC
  Administered 2018-03-14: 10 mg via INTRAVENOUS
  Filled 2018-03-13: qty 2

## 2018-03-13 MED ORDER — ONDANSETRON 8 MG PO TBDP: 8.0000 mg | ORAL_TABLET | Freq: Once | ORAL | Status: DC

## 2018-03-13 MED ORDER — DEXAMETHASONE SODIUM PHOSPHATE 10 MG/ML IJ SOLN
10.0000 mg | Freq: Once | INTRAMUSCULAR | Status: AC
  Administered 2018-03-14: 10 mg via INTRAVENOUS
  Filled 2018-03-13: qty 1

## 2018-03-13 MED ORDER — DIPHENOXYLATE-ATROPINE 2.5-0.025 MG PO TABS: 2.0000 | ORAL_TABLET | Freq: Once | ORAL | Status: DC

## 2018-03-13 MED ORDER — ONDANSETRON HCL 4 MG PO TABS: 4.0000 mg | ORAL_TABLET | Freq: Four times a day (QID) | ORAL | 0 refills | Status: DC

## 2018-03-13 MED ORDER — DIPHENHYDRAMINE HCL 50 MG/ML IJ SOLN
25.0000 mg | Freq: Once | INTRAMUSCULAR | Status: AC
  Administered 2018-03-14: 25 mg via INTRAVENOUS
  Filled 2018-03-13: qty 1

## 2018-03-13 MED ORDER — KETOROLAC TROMETHAMINE 30 MG/ML IJ SOLN
30.0000 mg | Freq: Once | INTRAMUSCULAR | Status: AC
  Administered 2018-03-14: 30 mg via INTRAVENOUS
  Filled 2018-03-13: qty 1

## 2018-03-13 NOTE — ED Provider Notes (Signed)
[facility] EMERGENCY DEPARTMENT Provider Note   CSN: [id number] Arrival date & time: 03/13/18  2208     History   Chief Complaint Chief Complaint  Patient presents with  . Headache    HPI [name] is a 32 y.o. female.  Patient presents to the emergency department for evaluation of headache.  Patient reports a bitemporal headache with throbbing pain behind both eyes.  This started 2 days ago.  She has light sensitivity and sound sensitivity with nausea.  Patient has had previous headaches that have been declared migraines.  Her primary doctor has put her on Topamax to help prevent her headaches, but she does not have a rescue medicine.     Past Medical History:  Diagnosis Date  . Abnormal vaginal bleeding   . Anxiety   . Bronchitis   . Depression   . Depression   . Diabetes mellitus without complication (HCC)   . Elevated hemoglobin A1c 03/09/2017   Cut carbs increase exercise and recheck in 3 months  . Gallstone   . Headache(784.0)   . 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 History of kidney stones   . Hypertension   . IUD (intrauterine device) in place 03/01/2013  . Migraines   . Neuropathy   . Obese   . Polycystic ovarian syndrome   . PTSD (post-traumatic stress disorder)   . PTSD (post-traumatic stress disorder)   . Sleep apnea   . Trichomonas contact, treated   . Vaginal discharge 12/01/2015  . Vitamin D deficiency 03/09/2017   Take 5000 IU vitamin D 3 daily  . Yeast infection 03/01/2013    Patient Active Problem List   Diagnosis Date Noted  . Vitamin D deficiency 03/09/2017  . Elevated hemoglobin A1c 03/09/2017  . 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 Depression 12/22/2016  . Hematuria 12/22/2016  . BV (bacterial vaginosis) 12/01/2015  . Encounter for insertion of mirena IUD 03/01/2013  . Yeast infection 03/01/2013  . Sleep apnea 02/28/2013  . Essential hypertension, benign 02/14/2013  . Gallstone     Past Surgical History:  Procedure  Laterality Date  . CESAREAN SECTION  09/24/2012   Procedure: CESAREAN SECTION;  Surgeon: Willodean Rosenthal, MD;  Location: [facility];  Service: Gynecology;  Laterality: N/A;  . CHOLECYSTECTOMY    . CHOLECYSTECTOMY N/A 04/05/2013   Procedure: LAPAROSCOPIC CHOLECYSTECTOMY;  Surgeon: Dalia Heading, MD;  Location: [facility];  Service: General;  Laterality: N/A;  . TONSILLECTOMY       OB History    Gravida  1   Para  1   Term  1   Preterm  0   AB  0   Living  1     SAB  0   TAB  0   Ectopic  0   Multiple  0   Live Births  1            Home Medications    Prior to Admission medications   Medication Sig Start Date End Date Taking? Authorizing Provider  acetaminophen (TYLENOL) 500 MG tablet Take 1,000 mg by mouth as needed.    [provider]  Cholecalciferol 5000 units capsule Take 1 capsule (5,000 Units total) by mouth daily. 12/05/17   Adline Potter, NP  clonazePAM (KLONOPIN) 1 MG tablet Take 1 mg by mouth 2 (two) times daily.    [provider]  cyclobenzaprine (FLEXERIL) 10 MG tablet TAKE ONE TABLET BY MOUTH EVERY 6 HOURS  AS NEEDED FOR MUSCLE SPASM 01/23/17   Lazaro Arms, MD  DULoxetine (CYMBALTA) 60 MG capsule Take 60 mg by mouth daily.    [provider]  escitalopram (LEXAPRO) 20 MG tablet TAKE ONE TABLET BY MOUTH ONCE DAILY 04/04/17   Lazaro Arms, MD  guanFACINE (TENEX) 1 MG tablet Take 1 mg by mouth at bedtime.    [provider]  hydrALAZINE (APRESOLINE) 50 MG tablet Take 50 mg by mouth 2 (two) times daily.    [provider]  levonorgestrel (MIRENA) 20 MCG/24HR IUD 1 each by Intrauterine route once.    [provider]  metroNIDAZOLE (METROGEL VAGINAL) 0.75 % vaginal gel Place 1 Applicatorful vaginally at bedtime. X 5 nights, no alcohol or sex while using 12/28/17   Cheral Marker, CNM  promethazine (PHENERGAN) 25 MG tablet Take 1 tablet (25 mg total) by mouth every 6 (six) hours as needed.  04/23/16   Donnetta Hutching, MD  topiramate (TOPAMAX) 25 MG tablet Take 25 mg by mouth. Takes 2 tabs in the am and 1 tab at night    [provider]  valACYclovir (VALTREX) 1000 MG tablet TAKE 1 TABLET BY MOUTH ONCE DAILY 02/16/18   Cheral Marker, CNM  zolpidem (AMBIEN) 10 MG tablet Take 1 tablet (10 mg total) by mouth at bedtime as needed for sleep. Patient taking differently: Take 10 mg by mouth at bedtime.  03/01/16 04/23/16  Lazaro Arms, MD  zolpidem (AMBIEN) 10 MG tablet TAKE ONE TABLET BY MOUTH AT BEDTIME AS NEEDED FOR SLEEP 10/24/16   Lazaro Arms, MD    Family History Family History  Problem Relation Age of Onset  . Diabetes Mother   . Asthma Brother   . Heart murmur Brother   . Crohn's disease Brother   . Diabetes Maternal Grandmother   . Cancer - Lung Maternal Grandmother   . Diabetes Maternal Grandfather   . Hypertension Maternal Grandfather   . Stroke Maternal Grandfather   . COPD Paternal Grandfather   . Diabetes Maternal Aunt   . Hypertension Maternal Aunt   . Diabetes Maternal Uncle   . Congestive Heart Failure Maternal Uncle   . Cancer Paternal Aunt        uterine    Social History Social History   Tobacco Use  . Smoking status: Current Some Day Smoker    Years: 3.00    Types: Cigarettes    Last attempt to quit: 01/17/2012    Years since quitting: 6.1  . Smokeless tobacco: Never Used  Substance Use Topics  . Alcohol use: Yes    Comment: occasionally   . Drug use: No     Allergies   Patient has no known allergies.   Review of Systems Review of Systems  Eyes: Positive for photophobia.  Gastrointestinal: Positive for nausea.  Neurological: Positive for headaches.  All other systems reviewed and are negative.    Physical Exam Updated Vital Signs BP (!) 143/89   Pulse 93   Temp 98.4 F (36.9 C)   Resp 17   Ht  (1.702 m)   Wt 135.9 kg (299 lb 11.2 oz)   SpO2 100%   BMI 46.94 kg/m   Physical Exam  Constitutional: She is  oriented to person, place, and time. She appears well-developed and well-nourished. No distress.  HENT:  Head: Normocephalic and atraumatic.  Right Ear: Hearing normal.  Left Ear: Hearing normal.  Nose: Nose normal.  Mouth/Throat: Oropharynx is clear and  moist and mucous membranes are normal.  Eyes: Pupils are equal, round, and reactive to light. Conjunctivae and EOM are normal.  Neck: Normal range of motion. Neck supple.  Cardiovascular: Regular rhythm, S1 normal and S2 normal. Exam reveals no gallop and no friction rub.  No murmur heard. Pulmonary/Chest: Effort normal and breath sounds normal. No respiratory distress. She exhibits no tenderness.  Abdominal: Soft. Normal appearance and bowel sounds are normal. There is no hepatosplenomegaly. There is no tenderness. There is no rebound, no guarding, no tenderness at McBurney's point and negative Murphy's sign. No hernia.  Musculoskeletal: Normal range of motion.  Neurological: She is alert and oriented to person, place, and time. She has normal strength. No cranial nerve deficit or sensory deficit. Coordination normal. GCS eye subscore is 4. GCS verbal subscore is 5. GCS motor subscore is 6.  Skin: Skin is warm, dry and intact. No rash noted. No cyanosis.  Psychiatric: She has a normal mood and affect. Her speech is normal and behavior is normal. Thought content normal.  Nursing note and vitals reviewed.    ED Treatments / Results  Labs (all labs ordered are listed, but only abnormal results are displayed) Labs Reviewed - No data to display  EKG None  Radiology No results found.  Procedures Procedures (including critical care time)  Medications Ordered in ED Medications - No data to display   Initial Impression / Assessment and Plan / ED Course  I have reviewed the triage vital signs and the nursing notes.  Pertinent labs & imaging results that were available during my care of the patient were reviewed by me and considered  in my medical decision making (see chart for details).     Patient presents to the emergency department for evaluation of migraine headache. Patient is currently experiencing a headache that is similar to previous migraines. Patient does not have any unusual features compared to previous migraines. Patient has normal neurologic examination. There are no unusual features, such as unusual intensity or sudden onset. As this headache is similar to previous migraines, there is no concern for subarachnoid hemorrhage or other etiology. Patient therefore does not require imaging. Patient treated as migraine headache.  Final Clinical Impressions(s) / ED Diagnoses   Final diagnoses:  Migraine without aura and without status migrainosus, not intractable    ED Discharge Orders        Ordered    diphenoxylate-atropine (LOMOTIL) 2.5-0.025 MG tablet  4 times daily PRN,   Status:  Discontinued     03/13/18 2308    ondansetron (ZOFRAN) 4 MG tablet  Every 6 hours,   Status:  Discontinued     03/13/18 2308       Gilda Crease, MD 03/14/18 [phone]

## 2018-03-13 NOTE — ED Triage Notes (Addendum)
Pt reports headache, n/v,photo sensitivity, blurred vision since Sunday night. Pt has a hx of migraines. Pt reports high blood pressure today 162/120 at her PCP today and was given clonidine and was told to go home and rest. Pt reports BP remaining high and chest pressure since this morning with sob.

## 2018-03-14 ENCOUNTER — Other Ambulatory Visit (HOSPITAL_COMMUNITY): Payer: Self-pay | Admitting: Family Medicine

## 2018-03-14 DIAGNOSIS — R011 Cardiac murmur, unspecified: Secondary | ICD-10-CM

## 2018-03-29 ENCOUNTER — Ambulatory Visit (HOSPITAL_COMMUNITY): Payer: Medicaid Other

## 2018-04-06 ENCOUNTER — Ambulatory Visit (HOSPITAL_COMMUNITY)
Admission: RE | Admit: 2018-04-06 | Discharge: 2018-04-06 | Disposition: A | Discharge status: Home / Self Care | Payer: Medicaid Other | Source: Ambulatory Visit | Attending: Family Medicine | Admitting: Family Medicine

## 2018-04-06 DIAGNOSIS — I10 Essential (primary) hypertension: Secondary | ICD-10-CM | POA: Insufficient documentation

## 2018-04-06 DIAGNOSIS — R011 Cardiac murmur, unspecified: Secondary | ICD-10-CM

## 2018-04-06 DIAGNOSIS — E119 Type 2 diabetes mellitus without complications: Secondary | ICD-10-CM | POA: Insufficient documentation

## 2018-04-06 NOTE — Progress Notes (Signed)
Echocardiogram 2D Echocardiogram has been performed.  Pieter PartridgeBrooke S Girtha Kilgore 04/06/2018, 3:47 PM

## 2018-04-24 ENCOUNTER — Other Ambulatory Visit: Payer: Self-pay | Admitting: Adult Health

## 2018-04-24 ENCOUNTER — Other Ambulatory Visit: Payer: Self-pay | Admitting: Women's Health

## 2018-05-16 ENCOUNTER — Other Ambulatory Visit (HOSPITAL_COMMUNITY): Payer: Self-pay | Admitting: Family Medicine

## 2018-05-16 ENCOUNTER — Ambulatory Visit: Payer: Medicaid Other | Admitting: Orthopaedic Surgery

## 2018-05-16 ENCOUNTER — Ambulatory Visit (HOSPITAL_COMMUNITY)
Admission: RE | Admit: 2018-05-16 | Discharge: 2018-05-16 | Disposition: A | Discharge status: Home / Self Care | Payer: Medicaid Other | Source: Ambulatory Visit | Attending: Family Medicine | Admitting: Family Medicine

## 2018-05-16 DIAGNOSIS — M25562 Pain in left knee: Secondary | ICD-10-CM | POA: Insufficient documentation

## 2018-05-17 ENCOUNTER — Ambulatory Visit: Payer: Medicaid Other | Admitting: Orthopaedic Surgery

## 2018-05-17 ENCOUNTER — Encounter: Payer: Self-pay | Admitting: Orthopaedic Surgery

## 2018-05-17 VITALS — BP 122/81 | HR 82 | Temp 98.4°F | Ht 67.0 in | Wt 289.0 lb

## 2018-05-17 DIAGNOSIS — M25562 Pain in left knee: Secondary | ICD-10-CM | POA: Diagnosis not present

## 2018-05-17 DIAGNOSIS — Z6841 Body Mass Index (BMI) 40.0 and over, adult: Secondary | ICD-10-CM | POA: Diagnosis not present

## 2018-05-17 NOTE — Patient Instructions (Signed)
Knee Exercises Ask your health care provider which exercises are safe for you. Do exercises exactly as told by your health care provider and adjust them as directed. It is normal to feel mild stretching, pulling, tightness, or discomfort as you do these exercises, but you should stop right away if you feel sudden pain or your pain gets worse.Do not begin these exercises until told by your health care provider. STRETCHING AND RANGE OF MOTION EXERCISES These exercises warm up your muscles and joints and improve the movement and flexibility of your knee. These exercises also help to relieve pain, numbness, and tingling. Exercise A: Knee Extension, Prone 1. Lie on your abdomen on a bed. 2. Place your left / right knee just beyond the edge of the surface so your knee is not on the bed. You can put a towel under your left / right thigh just above your knee for comfort. 3. Relax your leg muscles and allow gravity to straighten your knee. You should feel a stretch behind your left / right knee. 4. Hold this position for __________ seconds. 5. Scoot up so your knee is supported between repetitions. Repeat __________ times. Complete this stretch __________ times a day. Exercise B: Knee Flexion, Active  1. Lie on your back with both knees straight. If this causes back discomfort, bend your left / right knee so your foot is flat on the floor. 2. Slowly slide your left / right heel back toward your buttocks until you feel a gentle stretch in the front of your knee or thigh. 3. Hold this position for __________ seconds. 4. Slowly slide your left / right heel back to the starting position. Repeat __________ times. Complete this exercise __________ times a day. Exercise C: Quadriceps, Prone  1. Lie on your abdomen on a firm surface, such as a bed or padded floor. 2. Bend your left / right knee and hold your ankle. If you cannot reach your ankle or pant leg, loop a belt around your foot and grab the belt  instead. 3. Gently pull your heel toward your buttocks. Your knee should not slide out to the side. You should feel a stretch in the front of your thigh and knee. 4. Hold this position for __________ seconds. Repeat __________ times. Complete this stretch __________ times a day. Exercise D: Hamstring, Supine 1. Lie on your back. 2. Loop a belt or towel over the ball of your left / right foot. The ball of your foot is on the walking surface, right under your toes. 3. Straighten your left / right knee and slowly pull on the belt to raise your leg until you feel a gentle stretch behind your knee. ? Do not let your left / right knee bend while you do this. ? Keep your other leg flat on the floor. 4. Hold this position for __________ seconds. Repeat __________ times. Complete this stretch __________ times a day. STRENGTHENING EXERCISES These exercises build strength and endurance in your knee. Endurance is the ability to use your muscles for a long time, even after they get tired. Exercise E: Quadriceps, Isometric  1. Lie on your back with your left / right leg extended and your other knee bent. Put a rolled towel or small pillow under your knee if told by your health care provider. 2. Slowly tense the muscles in the front of your left / right thigh. You should see your kneecap slide up toward your hip or see increased dimpling just above the knee. This   motion will push the back of the knee toward the floor. 3. For __________ seconds, keep the muscle as tight as you can without increasing your pain. 4. Relax the muscles slowly and completely. Repeat __________ times. Complete this exercise __________ times a day. Exercise F: Straight Leg Raises - Quadriceps 1. Lie on your back with your left / right leg extended and your other knee bent. 2. Tense the muscles in the front of your left / right thigh. You should see your kneecap slide up or see increased dimpling just above the knee. Your thigh may  even shake a bit. 3. Keep these muscles tight as you raise your leg 4-6 inches (10-15 cm) off the floor. Do not let your knee bend. 4. Hold this position for __________ seconds. 5. Keep these muscles tense as you lower your leg. 6. Relax your muscles slowly and completely after each repetition. Repeat __________ times. Complete this exercise __________ times a day. Exercise G: Hamstring, Isometric 1. Lie on your back on a firm surface. 2. Bend your left / right knee approximately __________ degrees. 3. Dig your left / right heel into the surface as if you are trying to pull it toward your buttocks. Tighten the muscles in the back of your thighs to dig as hard as you can without increasing any pain. 4. Hold this position for __________ seconds. 5. Release the tension gradually and allow your muscles to relax completely for __________ seconds after each repetition. Repeat __________ times. Complete this exercise __________ times a day. Exercise H: Hamstring Curls  If told by your health care provider, do this exercise while wearing ankle weights. Begin with __________ weights. Then increase the weight by 1 lb (0.5 kg) increments. Do not wear ankle weights that are more than __________. 1. Lie on your abdomen with your legs straight. 2. Bend your left / right knee as far as you can without feeling pain. Keep your hips flat against the floor. 3. Hold this position for __________ seconds. 4. Slowly lower your leg to the starting position.  Repeat __________ times. Complete this exercise __________ times a day. Exercise I: Squats (Quadriceps) 1. Stand in front of a table, with your feet and knees pointing straight ahead. You may rest your hands on the table for balance but not for support. 2. Slowly bend your knees and lower your hips like you are going to sit in a chair. ? Keep your weight over your heels, not over your toes. ? Keep your lower legs upright so they are parallel with the table  legs. ? Do not let your hips go lower than your knees. ? Do not bend lower than told by your health care provider. ? If your knee pain increases, do not bend as low. 3. Hold the squat position for __________ seconds. 4. Slowly push with your legs to return to standing. Do not use your hands to pull yourself to standing. Repeat __________ times. Complete this exercise __________ times a day. Exercise J: Wall Slides (Quadriceps)  1. Lean your back against a smooth wall or door while you walk your feet out 18-24 inches (46-61 cm) from it. 2. Place your feet hip-width apart. 3. Slowly slide down the wall or door until your knees bend __________ degrees. Keep your knees over your heels, not over your toes. Keep your knees in line with your hips. 4. Hold for __________ seconds. Repeat __________ times. Complete this exercise __________ times a day. Exercise K: Straight Leg Raises -   Hip Abductors 1. Lie on your side with your left / right leg in the top position. Lie so your head, shoulder, knee, and hip line up. You may bend your bottom knee to help you keep your balance. 2. Roll your hips slightly forward so your hips are stacked directly over each other and your left / right knee is facing forward. 3. Leading with your heel, lift your top leg 4-6 inches (10-15 cm). You should feel the muscles in your outer hip lifting. ? Do not let your foot drift forward. ? Do not let your knee roll toward the ceiling. 4. Hold this position for __________ seconds. 5. Slowly return your leg to the starting position. 6. Let your muscles relax completely after each repetition. Repeat __________ times. Complete this exercise __________ times a day. Exercise L: Straight Leg Raises - Hip Extensors 1. Lie on your abdomen on a firm surface. You can put a pillow under your hips if that is more comfortable. 2. Tense the muscles in your buttocks and lift your left / right leg about 4-6 inches (10-15 cm). Keep your knee  straight as you lift your leg. 3. Hold this position for __________ seconds. 4. Slowly lower your leg to the starting position. 5. Let your leg relax completely after each repetition. Repeat __________ times. Complete this exercise __________ times a day. This information is not intended to replace advice given to you by your health care provider. Make sure you discuss any questions you have with your health care provider. Document Released: 09/07/2005 Document Revised: 07/18/2016 Document Reviewed: 08/30/2015 Elsevier Interactive Patient Education  2018 [employer]  Steps to Quit Smoking Smoking tobacco can be bad for your health. It can also affect almost every organ in your body. Smoking puts you and people around you at risk for many serious long-lasting (chronic) diseases. Quitting smoking is hard, but it is one of the best things that you can do for your health. It is never too late to quit. What are the benefits of quitting smoking? When you quit smoking, you lower your risk for getting serious diseases and conditions. They can include:  Lung cancer or lung disease.  Heart disease.  Stroke.  Heart attack.  Not being able to have children (infertility).  Weak bones (osteoporosis) and broken bones (fractures).  If you have coughing, wheezing, and shortness of breath, those symptoms may get better when you quit. You may also get sick less often. If you are pregnant, quitting smoking can help to lower your chances of having a baby of low birth weight. What can I do to help me quit smoking? Talk with your doctor about what can help you quit smoking. Some things you can do (strategies) include:  Quitting smoking totally, instead of slowly cutting back how much you smoke over a period of time.  Going to in-person counseling. You are more likely to quit if you go to many counseling sessions.  Using resources and support systems, such as: ? Online chats with a counselor. ? Phone  quitlines. ? Printed self-help materials. ? Support groups or group counseling. ? Text messaging programs. ? Mobile phone apps or applications.  Taking medicines. Some of these medicines may have nicotine in them. If you are pregnant or breastfeeding, do not take any medicines to quit smoking unless your doctor says it is okay. Talk with your doctor about counseling or other things that can help you.  Talk with your doctor about using more than one strategy   at the same time, such as taking medicines while you are also going to in-person counseling. This can help make quitting easier. What things can I do to make it easier to quit? Quitting smoking might feel very hard at first, but there is a lot that you can do to make it easier. Take these steps:  Talk to your family and friends. Ask them to support and encourage you.  Call phone quitlines, reach out to support groups, or work with a counselor.  Ask people who smoke to not smoke around you.  Avoid places that make you want (trigger) to smoke, such as: ? Bars. ? Parties. ? Smoke-break areas at work.  Spend time with people who do not smoke.  Lower the stress in your life. Stress can make you want to smoke. Try these things to help your stress: ? Getting regular exercise. ? Deep-breathing exercises. ? Yoga. ? Meditating. ? Doing a body scan. To do this, close your eyes, focus on one area of your body at a time from head to toe, and notice which parts of your body are tense. Try to relax the muscles in those areas.  Download or buy apps on your mobile phone or tablet that can help you stick to your quit plan. There are many free apps, such as QuitGuide from the CDC (Centers for Disease Control and Prevention). You can find more support from smokefree.gov and other websites.  This information is not intended to replace advice given to you by your health care provider. Make sure you discuss any questions you have with your health care  provider. Document Released: 08/20/2009 Document Revised: 06/21/2016 Document Reviewed: 03/10/2015 Elsevier Interactive Patient Education  2018 [employer]  

## 2018-05-17 NOTE — Progress Notes (Signed)
Subjective:    Patient ID: [name], female    DOB: 02/24/1986, 32 y.o.   MRN: [id number]  HPI She has had knee pain on the left for about two months.  She saw Dr. Selena Batten about this on 04-18-18.  I have reviewed the notes.  She has had swelling and popping.  She has no trauma.  She has tried ice, rest and Advil with minimal help.  X-rays were done yesterday which showed  IMPRESSION: Probable mild degenerative joint disease is noted medially. No acute abnormality seen in the left knee.  She has feeling the knee might give way but it has not. She has no redness. She has no other joint pain.  She is overweight.  Review of Systems  Constitutional: Positive for activity change.  Respiratory: Positive for shortness of breath. Negative for cough.   Cardiovascular: Negative for chest pain and leg swelling.  Musculoskeletal: Positive for arthralgias, gait problem and joint swelling.  Neurological: Positive for headaches.  Psychiatric/Behavioral: The patient is nervous/anxious.   All other systems reviewed and are negative.  For Review of Systems, all other systems reviewed and are negative.  Past Medical History:  Diagnosis Date  . Abnormal vaginal bleeding   . Anxiety   . Bronchitis   . Depression   . Depression   . Diabetes mellitus without complication (HCC)   . Elevated hemoglobin A1c 03/09/2017   Cut carbs increase exercise and recheck in 3 months  . Gallstone   . Headache(784.0)   . 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 History of kidney stones   . Hypertension   . IUD (intrauterine device) in place 03/01/2013  . Migraines   . Neuropathy   . Obese   . Polycystic ovarian syndrome   . PTSD (post-traumatic stress disorder)   . PTSD (post-traumatic stress disorder)   . Sleep apnea   . Trichomonas contact, treated   . Vaginal discharge 12/01/2015  . Vitamin D deficiency 03/09/2017   Take 5000 IU vitamin D 3 daily  . Yeast infection 03/01/2013    Past  Surgical History:  Procedure Laterality Date  . CESAREAN SECTION  09/24/2012   Procedure: CESAREAN SECTION;  Surgeon: Willodean Rosenthal, MD;  Location: [facility];  Service: Gynecology;  Laterality: N/A;  . CHOLECYSTECTOMY    . CHOLECYSTECTOMY N/A 04/05/2013   Procedure: LAPAROSCOPIC CHOLECYSTECTOMY;  Surgeon: Dalia Heading, MD;  Location: [facility];  Service: General;  Laterality: N/A;  . TONSILLECTOMY      Current Outpatient Medications on File Prior to Visit  Medication Sig Dispense Refill  . acetaminophen (TYLENOL) 500 MG tablet Take 1,000 mg by mouth as needed.    . carvedilol (COREG) 3.125 MG tablet Take 3.125 mg by mouth 2 (two) times daily with a meal.    . Cholecalciferol 5000 units capsule Take 1 capsule (5,000 Units total) by mouth daily.    . clonazePAM (KLONOPIN) 1 MG tablet Take 1 mg by mouth 2 (two) times daily.    . cyclobenzaprine (FLEXERIL) 10 MG tablet TAKE ONE TABLET BY MOUTH EVERY 6 HOURS AS NEEDED FOR MUSCLE SPASM 30 tablet 0  . DULoxetine (CYMBALTA) 60 MG capsule Take 60 mg by mouth daily.    Marland Kitchen escitalopram (LEXAPRO) 20 MG tablet TAKE ONE TABLET BY MOUTH ONCE DAILY 30 tablet 11  . fluconazole (DIFLUCAN) 150 MG tablet TAKE 1 TABLET BY MOUTH NOW AND REPEAT IN 3 DAYS 2 tablet 1  . guanFACINE (  TENEX) 1 MG tablet Take 1 mg by mouth at bedtime.    . hydrALAZINE (APRESOLINE) 50 MG tablet Take 50 mg by mouth 2 (two) times daily.    Marland Kitchen levonorgestrel (MIRENA) 20 MCG/24HR IUD 1 each by Intrauterine route once.    . metroNIDAZOLE (METROGEL) 0.75 % vaginal gel INSERT  APPLICATORFUL VAGINALLY AT BEDTIME FOR 5 NIGHTS. NO ALCOHOL OR SEX WHILE USING. 70 g 0  . promethazine (PHENERGAN) 25 MG tablet Take 1 tablet (25 mg total) by mouth every 6 (six) hours as needed. 10 tablet 0  . topiramate (TOPAMAX) 25 MG tablet Take 25 mg by mouth. Takes 2 tabs in the am and 1 tab at night    . valACYclovir (VALTREX) 1000 MG tablet TAKE 1 TABLET BY MOUTH ONCE DAILY 90 tablet 3  . zolpidem (AMBIEN) 10  MG tablet TAKE ONE TABLET BY MOUTH AT BEDTIME AS NEEDED FOR SLEEP 15 tablet 3   No current facility-administered medications on file prior to visit.     Social History   Socioeconomic History  . Marital status: Single    Spouse name: Not on file  . Number of children: Not on file  . Years of education: Not on file  . Highest education level: Not on file  Occupational History  . Not on file  Social Needs  . Financial resource strain: Not on file  . Food insecurity:    Worry: Not on file    Inability: Not on file  . Transportation needs:    Medical: Not on file    Non-medical: Not on file  Tobacco Use  . Smoking status: Current Some Day Smoker    Years: 3.00    Types: Cigarettes    Last attempt to quit: 01/17/2012    Years since quitting: 6.3  . Smokeless tobacco: Never Used  Substance and Sexual Activity  . Alcohol use: Yes    Comment: occasionally   . Drug use: No  . Sexual activity: Yes    Birth control/protection: IUD  Lifestyle  . Physical activity:    Days per week: Not on file    Minutes per session: Not on file  . Stress: Not on file  Relationships  . Social connections:    Talks on phone: Not on file    Gets together: Not on file    Attends religious service: Not on file    Active member of club or organization: Not on file    Attends meetings of clubs or organizations: Not on file    Relationship status: Not on file  . Intimate partner violence:    Fear of current or ex partner: Not on file    Emotionally abused: Not on file    Physically abused: Not on file    Forced sexual activity: Not on file  Other Topics Concern  . Not on file  Social History Narrative  . Not on file    Family History  Problem Relation Age of Onset  . Diabetes Mother   . Asthma Brother   . Heart murmur Brother   . Crohn's disease Brother   . Diabetes Maternal Grandmother   . Cancer - Lung Maternal Grandmother   . Diabetes Maternal Grandfather   . Hypertension Maternal  Grandfather   . Stroke Maternal Grandfather   . COPD Paternal Grandfather   . Diabetes Maternal Aunt   . Hypertension Maternal Aunt   . Diabetes Maternal Uncle   . Congestive Heart Failure Maternal Uncle   .  Cancer Paternal Aunt        uterine    BP 122/81   Pulse 82   Temp 98.4 F (36.9 C)   Ht 5\' 7"  (1.702 m)   Wt 289 lb (131.1 kg)   BMI 45.26 kg/m   Body mass index is 45.26 kg/m.     Objective:   Physical Exam  Constitutional: She is oriented to person, place, and time. She appears well-developed and well-nourished.  HENT:  Head: Normocephalic and atraumatic.  Eyes: Pupils are equal, round, and reactive to light. Conjunctivae and EOM are normal.  Neck: Normal range of motion. Neck supple.  Cardiovascular: Normal rate, regular rhythm and intact distal pulses.  Pulmonary/Chest: Effort normal.  Abdominal: Soft.  Musculoskeletal:       Left knee: She exhibits decreased range of motion and swelling. Tenderness found. Medial joint line tenderness noted.       Legs: Neurological: She is alert and oriented to person, place, and time. She has normal reflexes. She displays normal reflexes. No cranial nerve deficit. She exhibits normal muscle tone. Coordination normal.  Skin: Skin is warm and dry.  Psychiatric: She has a normal mood and affect. Her behavior is normal. Judgment and thought content normal.     I [facility] and the x-rays and x-ray report.     Assessment & Plan:   Encounter Diagnoses  Name Primary?  . Acute pain of left knee Yes  . Body mass index 45.0-49.9, adult (HCC)   . Morbid obesity (HCC)    PROCEDURE NOTE:  The patient requests injections of the left knee , verbal consent was obtained.  The left knee was prepped appropriately after time out was performed.   Sterile technique was observed and injection of 1 cc of Depo-Medrol 40 mg with several cc's of plain xylocaine. Anesthesia was provided by ethyl chloride and  a 20-gauge needle was used to inject the knee area. The injection was tolerated well.  A band aid dressing was applied.  The patient was advised to apply ice later today and tomorrow to the injection sight as needed.  I will see her back in two weeks.  Consider MRI if not improved.  Knee exercises given.  Call if any problem.  Precautions discussed.   Electronically Signed Darreld McleanWayne Dorrien Grunder, MD 7/11/20191:57 PM

## 2018-05-31 ENCOUNTER — Ambulatory Visit: Payer: Medicaid Other | Admitting: Orthopaedic Surgery

## 2018-08-02 ENCOUNTER — Ambulatory Visit: Payer: Medicaid Other | Admitting: Obstetrics & Gynecology

## 2018-12-03 ENCOUNTER — Other Ambulatory Visit: Payer: Self-pay | Admitting: Adult Health

## 2018-12-03 ENCOUNTER — Other Ambulatory Visit: Payer: Self-pay | Admitting: Women's Health

## 2018-12-06 ENCOUNTER — Ambulatory Visit: Payer: Medicaid Other | Admitting: Obstetrics and Gynecology

## 2018-12-13 ENCOUNTER — Ambulatory Visit: Payer: Medicaid Other | Admitting: Advanced Practice Midwife

## 2018-12-17 ENCOUNTER — Other Ambulatory Visit: Payer: Self-pay | Admitting: Women's Health

## 2018-12-18 ENCOUNTER — Encounter: Payer: Self-pay | Admitting: *Deleted

## 2018-12-18 ENCOUNTER — Ambulatory Visit: Payer: Medicaid Other | Admitting: Obstetrics and Gynecology

## 2018-12-18 ENCOUNTER — Encounter: Payer: Self-pay | Admitting: Obstetrics and Gynecology

## 2018-12-18 VITALS — BP 154/109 | HR 88 | Ht 67.0 in | Wt 292.2 lb

## 2018-12-18 DIAGNOSIS — N898 Other specified noninflammatory disorders of vagina: Secondary | ICD-10-CM

## 2018-12-18 LAB — POCT WET PREP (WET MOUNT): Trichomonas Wet Prep HPF POC: ABSENT

## 2018-12-18 MED ORDER — FLUCONAZOLE 150 MG PO TABS: ORAL_TABLET | ORAL | 0 refills | Status: DC

## 2018-12-18 NOTE — Progress Notes (Signed)
[facility] Visit  @DATE @            Patient name: [name] MRN [id number]  Date of birth: Jul 10, 1986  CC & HPI:  [name] is a 33 y.o. female presenting today for vaginal moisture she has been treated for yeast in the past.  She has mild "touch of diabetes" additionally she has obesity and hypertension Not sexually active in greater than a year.  IUD in place, normal IUD check last year ROS:  ROS Uncontrolled blood pressure 54/109 noted weight 292 Body mass index is 45.76 kg/m.   Pertinent History Reviewed:   Reviewed: Significant for  Medical         Past Medical History:  Diagnosis Date  . Abnormal vaginal bleeding   . Anxiety   . Bronchitis   . Depression   . Depression   . Diabetes mellitus without complication (HCC)   . Elevated hemoglobin A1c 03/09/2017   Cut carbs increase exercise and recheck in 3 months  . Gallstone   . Headache(784.0)   . 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 History of kidney stones   . Hypertension   . IUD (intrauterine device) in place 03/01/2013  . Migraines   . Neuropathy   . Obese   . Polycystic ovarian syndrome   . PTSD (post-traumatic stress disorder)   . PTSD (post-traumatic stress disorder)   . Sleep apnea   . Trichomonas contact, treated   . Vaginal discharge 12/01/2015  . Vitamin D deficiency 03/09/2017   Take 5000 IU vitamin D 3 daily  . Yeast infection 03/01/2013                              Surgical Hx:    Past Surgical History:  Procedure Laterality Date  . CESAREAN SECTION  09/24/2012   Procedure: CESAREAN SECTION;  Surgeon: Willodean Rosenthal, MD;  Location: [facility];  Service: Gynecology;  Laterality: N/A;  . CHOLECYSTECTOMY    . CHOLECYSTECTOMY N/A 04/05/2013   Procedure: LAPAROSCOPIC CHOLECYSTECTOMY;  Surgeon: Dalia Heading, MD;  Location: [facility];  Service: General;  Laterality: N/A;  . TONSILLECTOMY     Medications: Reviewed & Updated - see associated section            Current Outpatient Medications:  .  carvedilol (COREG) 3.125 MG tablet, Take 3.125 mg by mouth 2 (two) times daily with a meal., Disp: , Rfl:  .  Cholecalciferol 5000 units capsule, Take 1 capsule (5,000 Units total) by mouth daily., Disp: , Rfl:  .  clonazePAM (KLONOPIN) 1 MG tablet, Take 1 mg by mouth 2 (two) times daily., Disp: , Rfl:  .  cyclobenzaprine (FLEXERIL) 10 MG tablet, TAKE ONE TABLET BY MOUTH EVERY 6 HOURS AS NEEDED FOR MUSCLE SPASM, Disp: 30 tablet, Rfl: 0 .  DULoxetine (CYMBALTA) 60 MG capsule, Take 60 mg by mouth daily., Disp: , Rfl:  .  escitalopram (LEXAPRO) 20 MG tablet, TAKE ONE TABLET BY MOUTH ONCE DAILY, Disp: 30 tablet, Rfl: 11 .  guanFACINE (TENEX) 1 MG tablet, Take 1 mg by mouth at bedtime., Disp: , Rfl:  .  hydrALAZINE (APRESOLINE) 50 MG tablet, Take 50 mg by mouth 2 (two) times daily., Disp: , Rfl:  .  levonorgestrel (MIRENA) 20 MCG/24HR IUD, 1 each by Intrauterine route once., Disp: , Rfl:  .  promethazine (PHENERGAN) 25 MG tablet, Take 1  tablet (25 mg total) by mouth every 6 (six) hours as needed., Disp: 10 tablet, Rfl: 0 .  topiramate (TOPAMAX) 25 MG tablet, Take 25 mg by mouth. Takes 2 tabs in the am and 1 tab at night, Disp: , Rfl:  .  valACYclovir (VALTREX) 1000 MG tablet, TAKE 1 TABLET BY MOUTH ONCE DAILY, Disp: 90 tablet, Rfl: 3 .  acetaminophen (TYLENOL) 500 MG tablet, Take 1,000 mg by mouth as needed., Disp: , Rfl:  .  fluconazole (DIFLUCAN) 150 MG tablet, TAKE 1 TABLET BY MOUTH NOW AND  REPEAT  IN  3  DAYS (Patient not taking: Reported on 12/18/2018), Disp: 2 tablet, Rfl: 0 .  metroNIDAZOLE (METROGEL) 0.75 % vaginal gel, INSERT 1 APPLICATORFUL VAGINALLY AT BEDTIME FOR 5 NIGHTS. NO ALCOHOL OR SEX WHILE USING. (Patient not taking: Reported on 12/18/2018), Disp: 70 g, Rfl: 0 .  zolpidem (AMBIEN) 10 MG tablet, TAKE ONE TABLET BY MOUTH AT BEDTIME AS NEEDED FOR SLEEP (Patient not taking: Reported on 12/18/2018), Disp: 15 tablet, Rfl: 3   Social History:  Reviewed -  reports that she has been smoking cigarettes. She has smoked for the past 3.00 years. She has never used smokeless tobacco.  Objective Findings:  Vitals: Blood pressure (!) 154/109, pulse 88, height 5\' 7"  (1.702 m), weight 292 lb 3.2 oz (132.5 kg).  PHYSICAL EXAMINATION General appearance - alert, well appearing, and in no distress, oriented to person, place, and time and overweight Mental status - alert, oriented to person, place, and time, normal mood, behavior, speech, dress, motor activity, and thought processes Chest - clear to auscultation, no wheezes, rales or rhonchi, symmetric air entry Heart - normal rate and regular rhythm Abdomen - soft, nontender, nondistended, no masses or organomegaly Breasts -  Skin -   PELVIC External genitalia -generous slightly yellow discolored discharge in the periclitoral area small 3 mm area of excoriation of clitoral hood Vulva -thick labia majora with moisture trapping Vagina -moderate vaginal discharge Cervix -cervical mucus thickened IUD string present  Uterus -nonpurulent cervix, nontender uterus  Adnexa -nontender Wet Mount -generous lactobacilli and epithelials only occasional white cell no clue cells Rectal - rectal exam not indicated GC and Chlamydia collected   Assessment & Plan:   A:  1. Generous normal secretions 2. Rule out GC or chlamydia 3. Due to recent history of yeast will treat empirically with Diflucan again  P:  1. Rx Diflucan 150 mg every 3 days x2 2. Follow-up GC and Chlamydia

## 2018-12-21 LAB — GC/CHLAMYDIA PROBE AMP
Chlamydia trachomatis, NAA: NEGATIVE
Neisseria gonorrhoeae by PCR: NEGATIVE

## 2018-12-25 ENCOUNTER — Telehealth: Payer: Self-pay | Admitting: *Deleted

## 2018-12-25 NOTE — Telephone Encounter (Signed)
LMOVM that gc/chl labs were negative.

## 2018-12-27 ENCOUNTER — Other Ambulatory Visit: Payer: Self-pay | Admitting: Women's Health

## 2019-03-20 ENCOUNTER — Other Ambulatory Visit: Payer: Self-pay | Admitting: Women's Health

## 2019-03-20 ENCOUNTER — Other Ambulatory Visit: Payer: Self-pay | Admitting: Obstetrics and Gynecology

## 2019-04-12 ENCOUNTER — Telehealth: Payer: Self-pay | Admitting: *Deleted

## 2019-04-12 NOTE — Telephone Encounter (Signed)
Called patient and left message informing her that we are not allowing any visitors or children to come to visit with her at this time and we are requiring a mask to be worn during the visit. Asked if she has had any exposure to anyone suspected or confirmed of having COVID-19 or if she is experiencing any of the following: fever, cough, sob, muscle pain, severe headache, sore throat, diarrhea, loss of taste or smell or ear, nose or throat discomfort to call and reschedule.  Advised to call our office when she is in our office parking lot in order for us to complete registration over the phone.   

## 2019-04-15 ENCOUNTER — Other Ambulatory Visit: Payer: Self-pay

## 2019-04-15 ENCOUNTER — Ambulatory Visit (INDEPENDENT_AMBULATORY_CARE_PROVIDER_SITE_OTHER): Payer: Medicaid Other | Admitting: Adult Health

## 2019-04-15 ENCOUNTER — Encounter: Payer: Self-pay | Admitting: Adult Health

## 2019-04-15 VITALS — BP 162/108 | HR 76 | Ht 67.0 in | Wt 239.0 lb

## 2019-04-15 DIAGNOSIS — N9089 Other specified noninflammatory disorders of vulva and perineum: Secondary | ICD-10-CM | POA: Diagnosis not present

## 2019-04-15 DIAGNOSIS — N898 Other specified noninflammatory disorders of vagina: Secondary | ICD-10-CM

## 2019-04-15 LAB — POCT WET PREP (WET MOUNT)
Clue Cells Wet Prep Whiff POC: NEGATIVE
Trichomonas Wet Prep HPF POC: ABSENT

## 2019-04-15 MED ORDER — VALACYCLOVIR HCL 1 G PO TABS: 1000.0000 mg | ORAL_TABLET | Freq: Every day | ORAL | 3 refills | Status: DC

## 2019-04-15 MED ORDER — TRIAMCINOLONE ACETONIDE 0.025 % EX OINT: 1.0000 "application " | TOPICAL_OINTMENT | Freq: Two times a day (BID) | CUTANEOUS | 0 refills | Status: DC

## 2019-04-15 NOTE — Progress Notes (Signed)
Patient ID: [name], female   DOB: 01-Oct-1986, 33 y.o.   MRN: [id number] History of Present Illness: [name] is a 32 year old black female in complaining of rash and irritation in vulva area.She said she saw Dr Glo Herring in  February and he told her she had moisture changes. PCP is Dr Maudie Mercury, and he is currently adjusting her BP meds and she is back on metformin.   Current Medications, Allergies, Past Medical History, Past Surgical History, Family History and Social History were reviewed in [employer] record.     Review of Systems: Rash and irritation in vulva area, did use honey Pot pads and cream   Physical Exam:BP (!) 162/108   Pulse 76   Ht 5\' 7"  (1.702 m)   Wt 239 lb (108.4 kg)   BMI 37.43 kg/m  General:  Well developed, well nourished, no acute distress Skin:  Warm and dry Pelvic:  External genitalia has some redness and moisture changes on both labia, no lesions.  The vagina is normal in appearance, has some white discharge, no odor. Urethra has no lesions or masses. The cervix is bulbous.+IUD strings.  Uterus is felt to be normal size, shape, and contour.  No adnexal masses or tenderness noted.Bladder is non tender, no masses felt. Wet prep:few WBCs, other wise negative.Painted vulva and vaginal with gentian violet. Psych:  No mood changes, alert and cooperative,seems happy Fall risk is low. Examination chaperoned by Estill Bamberg Rash LPN.  She requests refill on valtrex. She says sugars better.   Impression: 1. Vulvar irritation   2. Vaginal discharge       Plan: Will rx mycolog ointment and can use A&D ointment or zinc oxide  Meds ordered this encounter  Medications  . triamcinolone (KENALOG) 0.025 % ointment    Sig: Apply 1 application topically 2 (two) times daily.    Dispense:  30 g    Refill:  0    Order Specific Question:   Supervising Provider    Answer:   Elonda Husky, LUTHER H [[id number]]  . valACYclovir (VALTREX) 1000 MG tablet    Sig: Take  1 tablet (1,000 mg total) by mouth daily.    Dispense:  90 tablet    Refill:  3    Order Specific Question:   Supervising Provider    Answer:   Tania Ade H [[id number]]  Change pantie liners often and use unscented Follow up prn

## 2019-05-20 ENCOUNTER — Telehealth: Payer: Self-pay | Admitting: Adult Health

## 2019-05-20 NOTE — Telephone Encounter (Signed)
Pt states that she was seen a few weeks ago for unusual moistness and was given a cream but the issue has not went away. Pt is wanting to see if more cream needs to be sent in or if she needs an appt.

## 2019-05-21 NOTE — Telephone Encounter (Signed)
Tried contacting pt to schedule. No answer, no voicemail.

## 2019-07-09 ENCOUNTER — Encounter: Payer: Self-pay | Admitting: Obstetrics and Gynecology

## 2019-07-09 ENCOUNTER — Other Ambulatory Visit: Payer: Self-pay

## 2019-07-09 ENCOUNTER — Ambulatory Visit (INDEPENDENT_AMBULATORY_CARE_PROVIDER_SITE_OTHER): Payer: Medicaid Other | Admitting: Obstetrics and Gynecology

## 2019-07-09 VITALS — BP 153/101 | HR 75 | Ht 67.0 in | Wt 286.5 lb

## 2019-07-09 DIAGNOSIS — L292 Pruritus vulvae: Secondary | ICD-10-CM

## 2019-07-09 DIAGNOSIS — N904 Leukoplakia of vulva: Secondary | ICD-10-CM

## 2019-07-09 MED ORDER — NYSTATIN-TRIAMCINOLONE 100000-0.1 UNIT/GM-% EX OINT: 1.0000 "application " | TOPICAL_OINTMENT | Freq: Two times a day (BID) | CUTANEOUS | 99 refills | Status: DC

## 2019-07-09 MED ORDER — FLUCONAZOLE 150 MG PO TABS: ORAL_TABLET | ORAL | 0 refills | Status: DC

## 2019-07-09 NOTE — Progress Notes (Signed)
[facility] Visit  @DATE @            Patient name: [name] MRN [id number]  Date of birth: [birth year]  CC & HPI:  [name] is a 33 y.o. female presenting today for vaginal discomfort.  She has been seen several times for vulvar irritation.  She is gained some weight.  She is diabetic.  She has not noticed any increased vaginal discharge but she is having a lot of itching at the labia majora where there in approximation.  ROS:  ROS   Pertinent History Reviewed:   Reviewed: Significant for Body mass index is 44.87 kg/m.  Medical         Past Medical History:  Diagnosis Date  . Abnormal vaginal bleeding   . Anxiety   . Bronchitis   . Depression   . Depression   . Diabetes mellitus without complication ([place])   . Elevated hemoglobin A1c 03/09/2017   Cut carbs increase exercise and recheck in 3 months  . Gallstone   . Headache(784.0)   . 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 History of kidney stones   . Hypertension   . IUD (intrauterine device) in place 03/01/2013  . Migraines   . Neuropathy   . Obese   . Polycystic ovarian syndrome   . PTSD (post-traumatic stress disorder)   . PTSD (post-traumatic stress disorder)   . Sleep apnea   . Trichomonas contact, treated   . Vaginal discharge 12/01/2015  . Vitamin D deficiency 03/09/2017   Take 5000 IU vitamin D 3 daily  . Yeast infection 03/01/2013                              Surgical Hx:    Past Surgical History:  Procedure Laterality Date  . CESAREAN SECTION  09/24/2012   Procedure: CESAREAN SECTION;  Surgeon: Lavonia Drafts, MD;  Location: [facility];  Service: Gynecology;  Laterality: N/A;  . CHOLECYSTECTOMY    . CHOLECYSTECTOMY N/A 04/05/2013   Procedure: LAPAROSCOPIC CHOLECYSTECTOMY;  Surgeon: Jamesetta So, MD;  Location: [facility];  Service: General;  Laterality: N/A;  . TONSILLECTOMY     Medications: Reviewed & Updated - see associated section                        Current Outpatient Medications:  .  carvedilol (COREG) 3.125 MG tablet, Take 3.125 mg by mouth 2 (two) times daily with a meal., Disp: , Rfl:  .  Cholecalciferol 5000 units capsule, Take 1 capsule (5,000 Units total) by mouth daily., Disp: , Rfl:  .  cyclobenzaprine (FLEXERIL) 10 MG tablet, TAKE ONE TABLET BY MOUTH EVERY 6 HOURS AS NEEDED FOR MUSCLE SPASM, Disp: 30 tablet, Rfl: 0 .  fluconazole (DIFLUCAN) 150 MG tablet, TAKE 1 TABLET BY MOUTH NOW THEN TAKE 1 TABLET IN 3 DAYS, Disp: 2 tablet, Rfl: 0 .  hydrALAZINE (APRESOLINE) 50 MG tablet, Take 50 mg by mouth 2 (two) times daily., Disp: , Rfl:  .  levonorgestrel (MIRENA) 20 MCG/24HR IUD, 1 each by Intrauterine route once., Disp: , Rfl:  .  promethazine (PHENERGAN) 25 MG tablet, Take 1 tablet (25 mg total) by mouth every 6 (six) hours as needed., Disp: 10 tablet, Rfl: 0 .  topiramate (TOPAMAX) 25 MG tablet, Take 25 mg by mouth. Takes 2 tabs in the am and 1  tab at night, Disp: , Rfl:  .  triamcinolone (KENALOG) 0.025 % ointment, Apply 1 application topically 2 (two) times daily., Disp: 30 g, Rfl: 0 .  valACYclovir (VALTREX) 1000 MG tablet, Take 1 tablet (1,000 mg total) by mouth daily., Disp: 90 tablet, Rfl: 3 .  acetaminophen (TYLENOL) 500 MG tablet, Take 1,000 mg by mouth as needed., Disp: , Rfl:  .  clonazePAM (KLONOPIN) 1 MG tablet, Take 1 mg by mouth 2 (two) times daily., Disp: , Rfl:  .  DULoxetine (CYMBALTA) 60 MG capsule, Take 60 mg by mouth daily., Disp: , Rfl:  .  escitalopram (LEXAPRO) 20 MG tablet, TAKE ONE TABLET BY MOUTH ONCE DAILY (Patient not taking: Reported on 04/15/2019), Disp: 30 tablet, Rfl: 11 .  guanFACINE (TENEX) 1 MG tablet, Take 1 mg by mouth at bedtime., Disp: , Rfl:  .  metroNIDAZOLE (METROGEL) 0.75 % vaginal gel, INSERT 1 APPLICATORFUL VAGINALLY AT BEDTIME FOR  5  NIGHTS.  NO  ALCOHOL  OR  SEX  WHILE  USING. (Patient not taking: Reported on 04/15/2019), Disp: 70 g, Rfl: 0 .  zolpidem (AMBIEN) 10 MG tablet, TAKE ONE TABLET BY  MOUTH AT BEDTIME AS NEEDED FOR SLEEP (Patient not taking: Reported on 12/18/2018), Disp: 15 tablet, Rfl: 3   Social History: Reviewed -  reports that she has been smoking cigarettes. She has smoked for the past 3.00 years. She has never used smokeless tobacco.  Objective Findings:  Vitals: Blood pressure (!) 153/101, pulse 75, height 5\' 7"  (1.702 m), weight 286 lb 8 oz (130 kg).  PHYSICAL EXAMINATION General appearance - alert, well appearing, and in no distress and overweight Mental status - alert, oriented to person, place, and time, depressed mood she seems very insightful about her overall health and willing to be realistic and the changes she needs to eventually Chest -unlabored breathing Heart - normal rate and regular rhythm Abdomen - soft, nontender, nondistended, no masses or organomegaly Exam limited by body habitus Breasts -  Skin -   PELVIC External genitalia -marked prominence of the labia majora due to weight gain with labia minora and approximation trapping all tissues of the labia minora and vaginal moisture.  I encouraged the patient not to shave Vulva -thickened tissues.  There is a band of irritated tissue" the 2 labia majora meet in the midline that is excoriated from itching.  On the inner aspects of the labia majora headed toward the labia minora is actually in better shape Vagina -light amount of discharge.  KOH was normal without use wet prep shows no trichomoniasis or clue cells Cervix -   Uterus -   Adnexa -  Wet Mount -KOH was normal without yeast.  Wet prep no trichomoniasis or clue cells Rectal -     Assessment & Plan:   A:  1.  vulvar excoriation. 2. 2.  Uncontrolled hypertension patient asked to see primary care physician  P:  1.  Dry vulva regimen, 2. Topical mycolog 3. Oral diflucan 4.  Given weight loss guidance and she states that under months she will be able to show me but she is keeping calorie counts.  I told her that 10% weight loss would  be transforming for her diabetes and her GYN tissues wellbeing

## 2019-07-09 NOTE — Patient Instructions (Signed)

## 2019-07-11 ENCOUNTER — Telehealth: Payer: Self-pay | Admitting: *Deleted

## 2019-07-11 NOTE — Telephone Encounter (Signed)
Pt called stating that she saw Dr. Glo Herring on Tuesday and needs a nurse to call her back.

## 2019-07-12 NOTE — Telephone Encounter (Signed)
Left message @ 8:37 am. JSY °

## 2019-07-12 NOTE — Telephone Encounter (Signed)
Pt returning call

## 2019-07-16 NOTE — Telephone Encounter (Signed)
Left message @ 1:02 pm. JSY 

## 2019-07-17 ENCOUNTER — Telehealth: Payer: Self-pay | Admitting: *Deleted

## 2019-07-17 NOTE — Telephone Encounter (Signed)
I called [facility] in [place] and advised the Mycolog ointment will need to be split in 2 medications for coverage. Pharmacy advised they would fix it. [place]

## 2019-07-17 NOTE — Telephone Encounter (Addendum)
Spoke with pt. Pt was prescribed Mycolog ointment and Dr. Glo Herring advise to call back if insurance won't cover it. Advised it may need to be split up in 2 prescriptions. Please advise. Thanks!! [place]

## 2019-08-21 ENCOUNTER — Encounter: Payer: Self-pay | Admitting: Obstetrics and Gynecology

## 2019-08-21 ENCOUNTER — Other Ambulatory Visit: Payer: Self-pay

## 2019-08-21 ENCOUNTER — Ambulatory Visit (INDEPENDENT_AMBULATORY_CARE_PROVIDER_SITE_OTHER): Payer: Medicaid Other | Admitting: Obstetrics and Gynecology

## 2019-08-21 VITALS — BP 127/88 | HR 79 | Ht 67.0 in | Wt 285.2 lb

## 2019-08-21 DIAGNOSIS — L292 Pruritus vulvae: Secondary | ICD-10-CM | POA: Diagnosis not present

## 2019-08-21 NOTE — Progress Notes (Signed)
Patient ID: [name], female   DOB: [birth year], 33 y.o.   MRN: [id number]    [facility] Visit  @DATE @            Patient name: [name] MRN [id number]  Date of birth: 07-Feb-1986  CC & HPI:  [name] is a 33 y.o. female presenting today for gyn f/u of vulvar irritation. There's still chronic moisture in pelvic area. There is irritation in clitoral area. Hasn't scratched as much at clitoral hood and followed MD direction of keeping area dry and using medications as instructed.  BP is normal after starting new medication from PCP.  ROS:  ROS   Pertinent History Reviewed:   Reviewed:  Medical         Past Medical History:  Diagnosis Date  . Abnormal vaginal bleeding   . Anxiety   . Bronchitis   . Depression   . Depression   . Diabetes mellitus without complication ([name])   . Elevated hemoglobin A1c 03/09/2017   Cut carbs increase exercise and recheck in 3 months  . Gallstone   . Headache(784.0)   . 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 History of kidney stones   . Hypertension   . IUD (intrauterine device) in place 03/01/2013  . Migraines   . Neuropathy   . Obese   . Polycystic ovarian syndrome   . PTSD (post-traumatic stress disorder)   . PTSD (post-traumatic stress disorder)   . Sleep apnea   . Trichomonas contact, treated   . Vaginal discharge 12/01/2015  . Vitamin D deficiency 03/09/2017   Take 5000 IU vitamin D 3 daily  . Yeast infection 03/01/2013                              Surgical Hx:    Past Surgical History:  Procedure Laterality Date  . CESAREAN SECTION  09/24/2012   Procedure: CESAREAN SECTION;  Surgeon: Lavonia Drafts, MD;  Location: [facility];  Service: Gynecology;  Laterality: N/A;  . CHOLECYSTECTOMY    . CHOLECYSTECTOMY N/A 04/05/2013   Procedure: LAPAROSCOPIC CHOLECYSTECTOMY;  Surgeon: Jamesetta So, MD;  Location: [facility];  Service: General;  Laterality: N/A;  . TONSILLECTOMY     Medications:  Reviewed & Updated - see associated section                       Current Outpatient Medications:  .  acetaminophen (TYLENOL) 500 MG tablet, Take 1,000 mg by mouth as needed., Disp: , Rfl:  .  carvedilol (COREG) 3.125 MG tablet, Take 3.125 mg by mouth 2 (two) times daily with a meal., Disp: , Rfl:  .  Cholecalciferol 5000 units capsule, Take 1 capsule (5,000 Units total) by mouth daily., Disp: , Rfl:  .  clonazePAM (KLONOPIN) 1 MG tablet, Take 1 mg by mouth 2 (two) times daily., Disp: , Rfl:  .  cyclobenzaprine (FLEXERIL) 10 MG tablet, TAKE ONE TABLET BY MOUTH EVERY 6 HOURS AS NEEDED FOR MUSCLE SPASM, Disp: 30 tablet, Rfl: 0 .  DULoxetine (CYMBALTA) 60 MG capsule, Take 60 mg by mouth daily., Disp: , Rfl:  .  escitalopram (LEXAPRO) 20 MG tablet, TAKE ONE TABLET BY MOUTH ONCE DAILY (Patient not taking: Reported on 04/15/2019), Disp: 30 tablet, Rfl: 11 .  fluconazole (DIFLUCAN) 150 MG tablet, TAKE 1 TABLET BY MOUTH NOW THEN TAKE 1 TABLET  IN 3 DAYS, Disp: 2 tablet, Rfl: 0 .  guanFACINE (TENEX) 1 MG tablet, Take 1 mg by mouth at bedtime., Disp: , Rfl:  .  hydrALAZINE (APRESOLINE) 50 MG tablet, Take 50 mg by mouth 2 (two) times daily., Disp: , Rfl:  .  levonorgestrel (MIRENA) 20 MCG/24HR IUD, 1 each by Intrauterine route once., Disp: , Rfl:  .  metroNIDAZOLE (METROGEL) 0.75 % vaginal gel, INSERT 1 APPLICATORFUL VAGINALLY AT BEDTIME FOR  5  NIGHTS.  NO  ALCOHOL  OR  SEX  WHILE  USING. (Patient not taking: Reported on 04/15/2019), Disp: 70 g, Rfl: 0 .  nystatin-triamcinolone ointment (MYCOLOG), Apply 1 application topically 2 (two) times daily. To affected area., Disp: 30 g, Rfl: prn .  promethazine (PHENERGAN) 25 MG tablet, Take 1 tablet (25 mg total) by mouth every 6 (six) hours as needed., Disp: 10 tablet, Rfl: 0 .  topiramate (TOPAMAX) 25 MG tablet, Take 25 mg by mouth. Takes 2 tabs in the am and 1 tab at night, Disp: , Rfl:  .  triamcinolone (KENALOG) 0.025 % ointment, Apply 1 application topically 2  (two) times daily., Disp: 30 g, Rfl: 0 .  valACYclovir (VALTREX) 1000 MG tablet, Take 1 tablet (1,000 mg total) by mouth daily., Disp: 90 tablet, Rfl: 3 .  zolpidem (AMBIEN) 10 MG tablet, TAKE ONE TABLET BY MOUTH AT BEDTIME AS NEEDED FOR SLEEP (Patient not taking: Reported on 12/18/2018), Disp: 15 tablet, Rfl: 3   Social History: Reviewed -  reports that she has been smoking cigarettes. She has smoked for the past 3.00 years. She has never used smokeless tobacco.  Objective Findings:  Vitals: There were no vitals taken for this visit.  PHYSICAL EXAMINATION General appearance - alert, well appearing, and in no distress Mental status - alert, oriented to person, place, and time, normal mood, behavior, speech, dress, motor activity, and thought processes, affect appropriate to mood  PELVIC Vulva - minimal whitening above clitoral hood, appropriately helaing Vagina - not examined Cervix - not examined Uterus - not examined  Assessment & Plan:   A:  1.  Vulvar excoriation, healing  2. DM 3. HTN, medication controlled  P: I personally performed the services described in this documentation, which was SCRIBED in my presence. The recorded information has been reviewed and considered accurate. It has been edited as necessary during review. [name], MD   1.  Continue nystatin 2. Continue to monitor DM 3. Weight loss encouraged. Goal : 10%loss =29 lb 4.     By signing my name below, I, [name], attest that this documentation has been prepared under the direction and in the presence of [name], MD. Electronically Signed: [name] Medical Scribe. 08/21/19. 3:26 PM.  {Add scribe attestation statemen I personally performed the services described in this documentation, which was SCRIBED in my presence. The recorded information has been reviewed and considered accurate. It has been edited as necessary during review. [name], MD

## 2019-09-03 IMAGING — CT CT HEAD W/O CM
3 series · 16 of 47 positions shown, 19 images · non-contrast
Comparison: 04/30/2007 CT

CLINICAL DATA: 31-year-old female with acute headache today.

EXAM:
CT HEAD WITHOUT CONTRAST
TECHNIQUE: Contiguous axial images were obtained from the base of the skull
through the vertex without intravenous contrast.

[Series 3: head wo · axial · 0.45mm/px · z∈[-555,-420]mm · 10 of 33 slices shown, 13 images]
[im 3/33  brain]
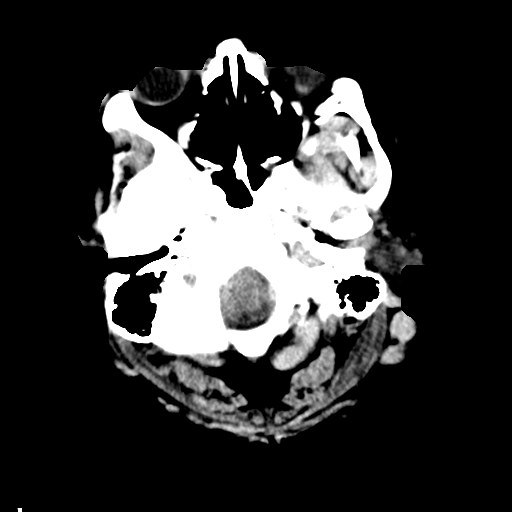
[im 3/33  bone]
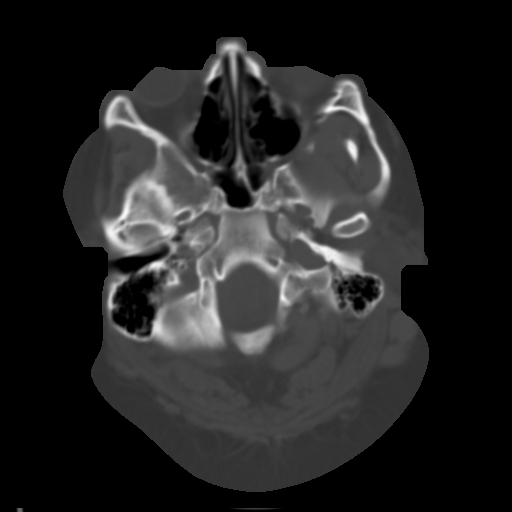
[im 6/33  brain]
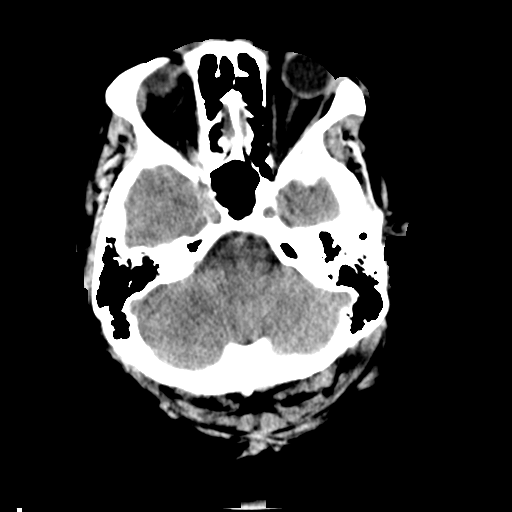
[im 9/33  brain]
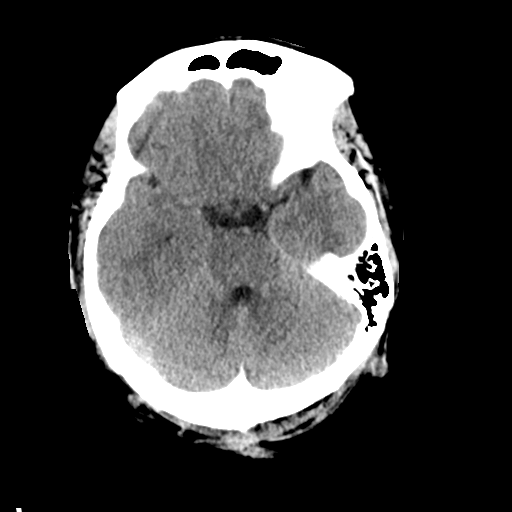
[im 12/33  brain]
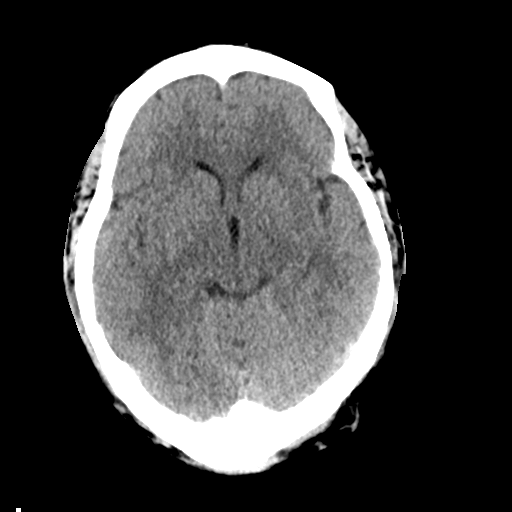
[im 15/33  brain]
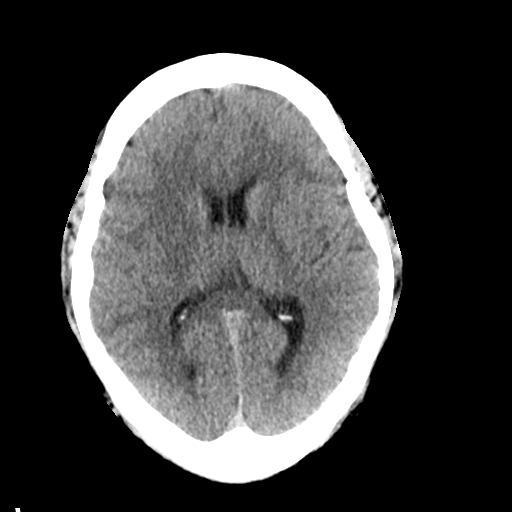
[im 15/33  bone]
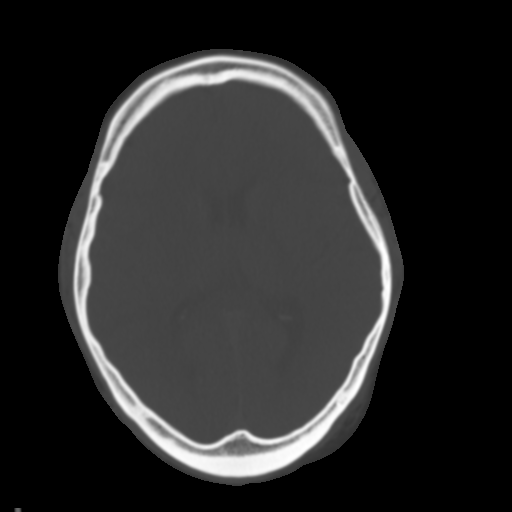
[im 18/33  brain]
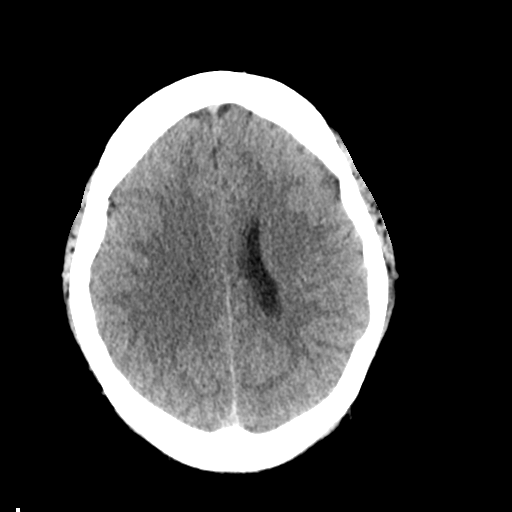
[im 21/33  brain]
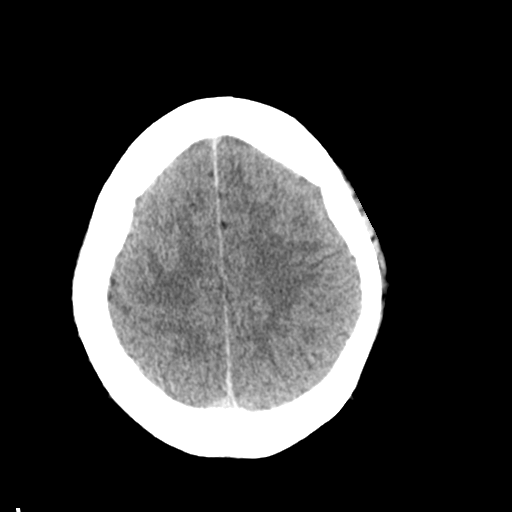
[im 25/33  brain]
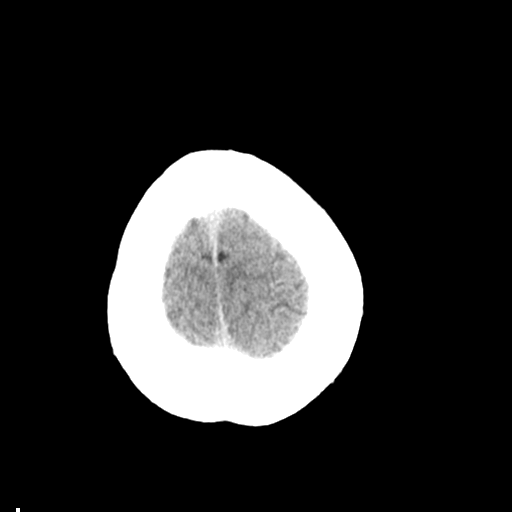
[im 27/33  brain]
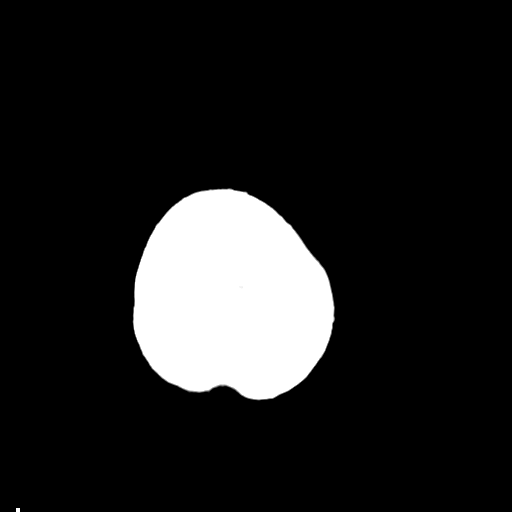
[im 27/33  bone]
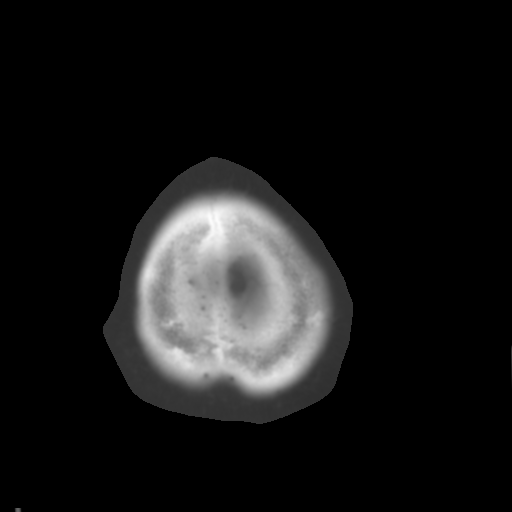
[im 30/33  brain]
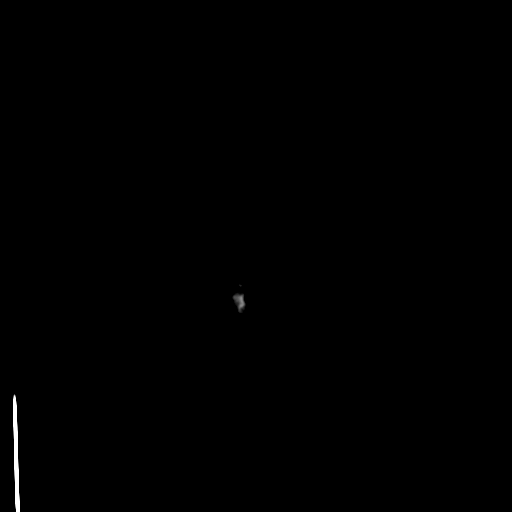

[Series 5: coronal soft tissue · coronal · 0.33mm/px · 3 of 72 slices shown]
[im 24/72  brain]
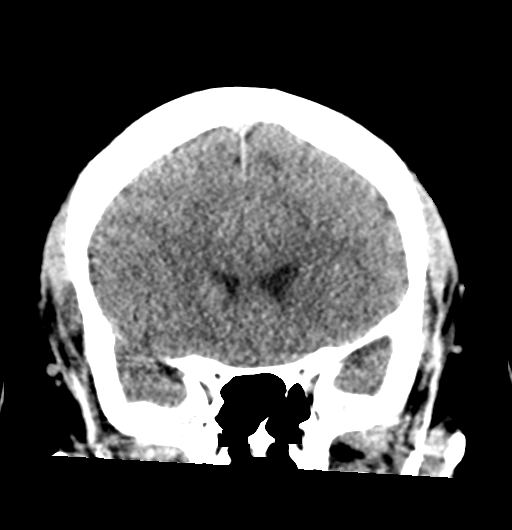
[im 32/72  brain]
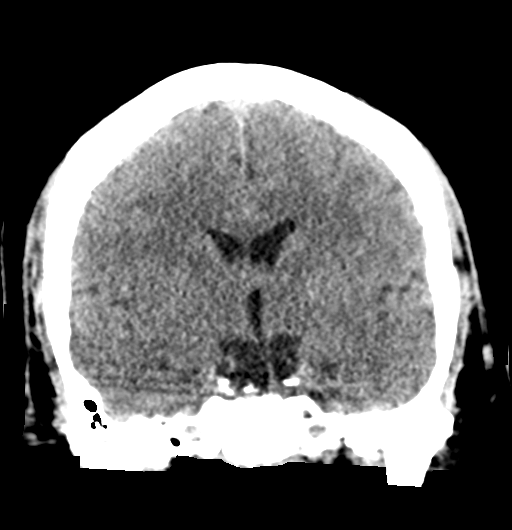
[im 40/72  brain]
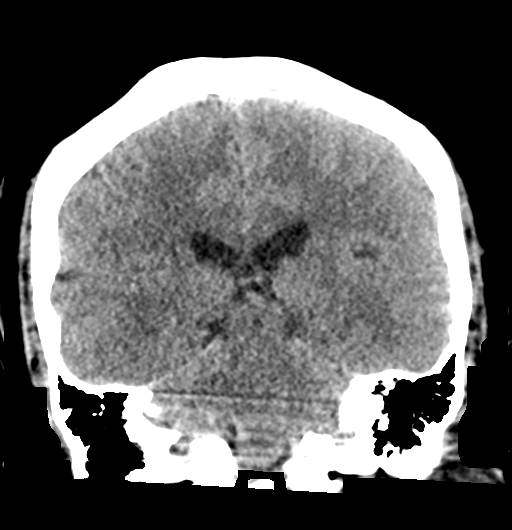

[Series 6: sagittal soft tissue · sagittal · 0.34mm/px · 3 of 58 slices shown]
[im 20/58  brain]
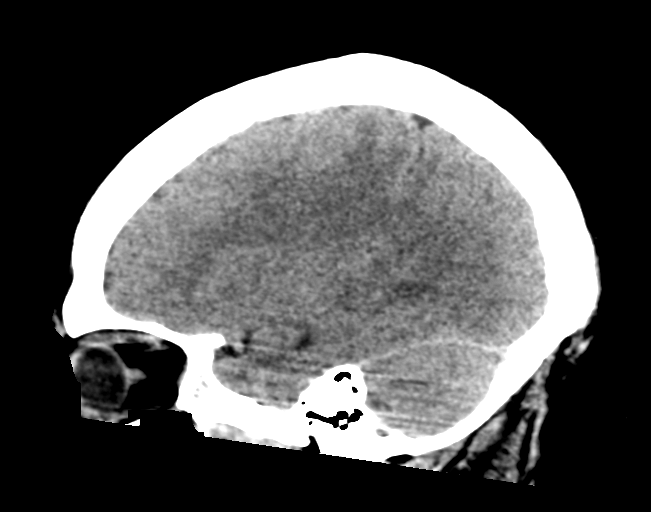
[im 29/58  brain]
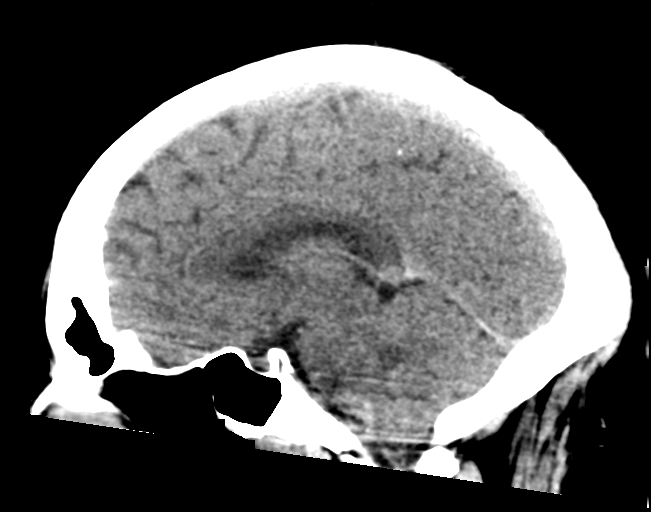
[im 39/58  brain]
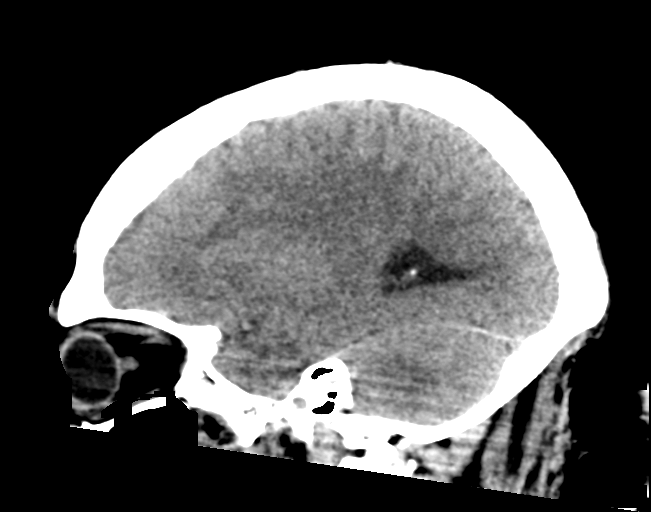

[16 of 47 positions shown; findings below may reference images not displayed]

FINDINGS: Brain: No evidence of acute infarction, hemorrhage, hydrocephalus,
extra-axial collection or mass lesion/mass effect.

Vascular: No hyperdense vessel or unexpected calcification.

Skull: Normal. Negative for fracture or focal lesion.

Sinuses/Orbits: No acute finding.

Other: None.
IMPRESSION: Unremarkable noncontrast head CT.

## 2019-12-27 ENCOUNTER — Other Ambulatory Visit: Payer: Self-pay | Admitting: Obstetrics and Gynecology

## 2020-01-17 DIAGNOSIS — IMO0002 Reserved for concepts with insufficient information to code with codable children: Secondary | ICD-10-CM | POA: Insufficient documentation

## 2020-03-31 ENCOUNTER — Other Ambulatory Visit: Payer: Self-pay | Admitting: Obstetrics and Gynecology

## 2020-05-18 ENCOUNTER — Other Ambulatory Visit: Payer: Self-pay | Admitting: Adult Health

## 2020-06-02 DIAGNOSIS — R05 Cough: Secondary | ICD-10-CM | POA: Diagnosis not present

## 2020-06-02 DIAGNOSIS — R509 Fever, unspecified: Secondary | ICD-10-CM | POA: Diagnosis not present

## 2020-06-02 DIAGNOSIS — R438 Other disturbances of smell and taste: Secondary | ICD-10-CM | POA: Diagnosis not present

## 2020-06-22 DIAGNOSIS — B349 Viral infection, unspecified: Secondary | ICD-10-CM | POA: Diagnosis not present

## 2020-06-22 DIAGNOSIS — Z20822 Contact with and (suspected) exposure to covid-19: Secondary | ICD-10-CM | POA: Diagnosis not present

## 2020-07-09 ENCOUNTER — Other Ambulatory Visit: Payer: Medicaid Other

## 2020-10-22 DIAGNOSIS — Z1159 Encounter for screening for other viral diseases: Secondary | ICD-10-CM | POA: Diagnosis not present

## 2020-11-21 ENCOUNTER — Other Ambulatory Visit: Payer: Medicaid Other

## 2020-11-24 ENCOUNTER — Other Ambulatory Visit: Payer: Medicaid Other

## 2020-11-30 DIAGNOSIS — F4001 Agoraphobia with panic disorder: Secondary | ICD-10-CM | POA: Insufficient documentation

## 2020-11-30 DIAGNOSIS — F422 Mixed obsessional thoughts and acts: Secondary | ICD-10-CM | POA: Insufficient documentation

## 2020-11-30 DIAGNOSIS — F411 Generalized anxiety disorder: Secondary | ICD-10-CM | POA: Insufficient documentation

## 2020-11-30 DIAGNOSIS — F410 Panic disorder [episodic paroxysmal anxiety] without agoraphobia: Secondary | ICD-10-CM | POA: Insufficient documentation

## 2020-11-30 DIAGNOSIS — F4310 Post-traumatic stress disorder, unspecified: Secondary | ICD-10-CM | POA: Insufficient documentation

## 2021-01-12 ENCOUNTER — Other Ambulatory Visit (HOSPITAL_BASED_OUTPATIENT_CLINIC_OR_DEPARTMENT_OTHER): Payer: Self-pay

## 2021-01-12 DIAGNOSIS — G4730 Sleep apnea, unspecified: Secondary | ICD-10-CM

## 2021-01-29 ENCOUNTER — Other Ambulatory Visit: Payer: Self-pay

## 2021-01-29 ENCOUNTER — Ambulatory Visit: Payer: Medicaid Other | Attending: Neurology | Admitting: Neurology

## 2021-01-29 DIAGNOSIS — Z79899 Other long term (current) drug therapy: Secondary | ICD-10-CM | POA: Diagnosis not present

## 2021-01-29 DIAGNOSIS — Z793 Long term (current) use of hormonal contraceptives: Secondary | ICD-10-CM | POA: Insufficient documentation

## 2021-01-29 DIAGNOSIS — G4730 Sleep apnea, unspecified: Secondary | ICD-10-CM | POA: Diagnosis not present

## 2021-02-02 NOTE — Procedures (Addendum)
[facility] Virgilia Quigg A. Gerilyn Pilgrim, MD     [email or web address]             NOCTURNAL POLYSOMNOGRAPHY   LOCATION: [facility]    Patient Name: [name], [name] Date: 01/29/2021 Gender: Female D.O.B: 09/21/1986 Age (years): 34 Referring Provider: Jorge Mandril NP Height (inches): 67 Interpreting Physician: Beryle Beams MD, ABSM Weight (lbs): 520 RPSGT: Peak, Robert BMI: 81 MRN: [id number] Neck Size: CLINICAL INFORMATION Sleep Study Type: NPSG     Indication for sleep study: N/A     Epworth Sleepiness Score: 20     SLEEP STUDY TECHNIQUE As per the AASM Manual for the Scoring of Sleep and Associated Events v2.3 (April 2016) with a hypopnea requiring 4% desaturations.  The channels recorded and monitored were frontal, central and occipital EEG, electrooculogram (EOG), submentalis EMG (chin), nasal and oral airflow, thoracic and abdominal wall motion, anterior tibialis EMG, snore microphone, electrocardiogram, and pulse oximetry.  MEDICATIONS Medications self-administered by patient taken the night of the study : N/A  Current Outpatient Medications:  .  acetaminophen (TYLENOL) 500 MG tablet, Take 1,000 mg by mouth as needed., Disp: , Rfl:  .  carvedilol (COREG) 3.125 MG tablet, Take 3.125 mg by mouth 2 (two) times daily with a meal., Disp: , Rfl:  .  Cholecalciferol 5000 units capsule, Take 1 capsule (5,000 Units total) by mouth daily. (Patient not taking: Reported on 08/21/2019), Disp: , Rfl:  .  clonazePAM (KLONOPIN) 1 MG tablet, Take 1 mg by mouth 2 (two) times daily., Disp: , Rfl:  .  cyclobenzaprine (FLEXERIL) 10 MG tablet, TAKE ONE TABLET BY MOUTH EVERY 6 HOURS AS NEEDED FOR MUSCLE SPASM, Disp: 30 tablet, Rfl: 0 .  DULoxetine (CYMBALTA) 60 MG capsule, Take 60 mg by mouth daily., Disp: , Rfl:  .  escitalopram (LEXAPRO) 20 MG tablet, TAKE ONE TABLET BY MOUTH ONCE DAILY, Disp: 30 tablet, Rfl: 11 .  fluconazole (DIFLUCAN) 150 MG tablet, TAKE 1 TABLET  BY MOUTH NOW, THEN TAKE ONE TABLET IN 3 DAYS, Disp: 2 tablet, Rfl: 0 .  guanFACINE (TENEX) 1 MG tablet, Take 1 mg by mouth at bedtime., Disp: , Rfl:  .  hydrALAZINE (APRESOLINE) 50 MG tablet, Take 50 mg by mouth 2 (two) times daily., Disp: , Rfl:  .  levonorgestrel (MIRENA) 20 MCG/24HR IUD, 1 each by Intrauterine route once., Disp: , Rfl:  .  metoprolol tartrate (LOPRESSOR) 25 MG tablet, Take 25 mg by mouth 2 (two) times daily., Disp: , Rfl:  .  metroNIDAZOLE (METROGEL) 0.75 % vaginal gel, INSERT 1 APPLICATORFUL VAGINALLY AT BEDTIME FOR  5  NIGHTS.  NO  ALCOHOL  OR  SEX  WHILE  USING., Disp: 70 g, Rfl: 0 .  nystatin-triamcinolone ointment (MYCOLOG), Apply 1 application topically 2 (two) times daily. To affected area., Disp: 30 g, Rfl: prn .  promethazine (PHENERGAN) 25 MG tablet, Take 1 tablet (25 mg total) by mouth every 6 (six) hours as needed., Disp: 10 tablet, Rfl: 0 .  topiramate (TOPAMAX) 25 MG tablet, Take 25 mg by mouth. Takes 2 tabs in the am and 1 tab at night, Disp: , Rfl:  .  triamcinolone (KENALOG) 0.025 % ointment, Apply 1 application topically 2 (two) times daily., Disp: 30 g, Rfl: 0 .  valACYclovir (VALTREX) 1000 MG tablet, Take 1 tablet by mouth once daily, Disp: 90 tablet, Rfl: 0 .  Vitamin D, Ergocalciferol, (DRISDOL) 1.25 MG (50000 UT) CAPS capsule, Take 50,000 Units by mouth every 7 (seven) days., Disp: ,  Rfl:  .  zolpidem (AMBIEN) 10 MG tablet, TAKE ONE TABLET BY MOUTH AT BEDTIME AS NEEDED FOR SLEEP (Patient not taking: Reported on 12/18/2018), Disp: 15 tablet, Rfl: 3     SLEEP ARCHITECTURE The study was initiated at 10:10:39 PM and ended at 5:54:31 AM.  Sleep onset time was 159.1 minutes and the sleep efficiency was 61.9%. The total sleep time was 287.2 minutes.  Stage REM latency was 100.0 minutes.  The patient spent 2.44% of the night in stage N1 sleep, 69.89% in stage N2 sleep, 1.04% in stage N3 and 26.6% in REM.  Alpha intrusion was absent.  Supine sleep was  0.00%.  RESPIRATORY PARAMETERS The overall apnea/hypopnea index (AHI) was 10.2 per hour. There were 8 total apneas, including 3 obstructive, 5 central and 0 mixed apneas. There were 41 hypopneas and 0 RERAs.  The AHI during Stage REM sleep was 33.7 per hour.  AHI while supine was N/A per hour.  The mean oxygen saturation was 95.98%. The minimum SpO2 during sleep was 87.00%.  loud snoring was noted during this study.  CARDIAC DATA The 2 lead EKG demonstrated sinus rhythm. The mean heart rate was 78.45 beats per minute. Other EKG findings include: None. LEG MOVEMENT DATA The total PLMS were 0 with a resulting PLMS index of 0.00. Associated arousal with leg movement index was 0.0.  IMPRESSIONS 1. Mild-to-moderate mostly REM related obstructive sleep apnea syndrome  is documented with this study. Auto PAP 8-20 is recommended. 2.  Abnormal CT body structure with reduced sleep efficiency and reduced slow-wave sleep is also noted.    Argie Ramming, MD Diplomate, American Board of Sleep Medicine.   ELECTRONICALLY SIGNED ON:  02/02/2021, 5:42 PM  [facility] PH: [phone]   FX: [phone] ACCREDITED BY THE AMERICAN ACADEMY OF SLEEP MEDICINE

## 2021-02-05 ENCOUNTER — Other Ambulatory Visit: Payer: Self-pay | Admitting: Adult Health

## 2021-02-09 ENCOUNTER — Ambulatory Visit: Payer: Medicaid Other | Admitting: Adult Health

## 2021-02-09 ENCOUNTER — Other Ambulatory Visit: Payer: Self-pay

## 2021-02-09 ENCOUNTER — Encounter: Payer: Self-pay | Admitting: Adult Health

## 2021-02-09 VITALS — BP 138/84 | HR 86 | Ht 67.0 in | Wt 302.0 lb

## 2021-02-09 DIAGNOSIS — N907 Vulvar cyst: Secondary | ICD-10-CM | POA: Diagnosis not present

## 2021-02-09 DIAGNOSIS — Z975 Presence of (intrauterine) contraceptive device: Secondary | ICD-10-CM | POA: Diagnosis not present

## 2021-02-09 MED ORDER — TRIAMCINOLONE ACETONIDE 0.025 % EX OINT: 1.0000 "application " | TOPICAL_OINTMENT | Freq: Two times a day (BID) | CUTANEOUS | 2 refills | Status: DC

## 2021-02-09 MED ORDER — NYSTATIN 100000 UNIT/GM EX OINT: 1.0000 "application " | TOPICAL_OINTMENT | Freq: Two times a day (BID) | CUTANEOUS | 2 refills | Status: DC

## 2021-02-09 NOTE — Progress Notes (Signed)
  Subjective:     Patient ID: [name], female   DOB: 1986-06-07, 35 y.o.   MRN: [id number]  HPI [name] is a 35 year old black female,single, G1P1001 in complaining of bump in vaginal area. Has increased moisture and itching at times. PCP is Dr Selena Batten.   Review of Systems +felt bump in vaginal area,about 2 weeks now, noticed when wiping  +moisture and itching at times Reviewed past medical,surgical, social and family history. Reviewed medications and allergies.     Objective:   Physical Exam BP 138/84 (BP Location: Right Arm, Patient Position: Sitting, Cuff Size: Large)   Pulse 86   Ht 5\' 7"  (1.702 m)   Wt (!) 302 lb (137 kg)   BMI 47.30 kg/m   Skin warm and dry.Pelvic: external genitalia is normal, but has 1 cm epidermal cyst left labia near base, vagina: pink with good moisture,urethra has no lesions or masses noted, cervix:smooth and bulbous, +IUD strings at os,uterus: normal size, shape and contour, non tender, no masses felt, adnexa: no masses or tenderness noted. Bladder is non tender and no masses felt.     [facility] - 02/09/21 1549      Pregnancy Intention Screening   Does the patient want to become pregnant in the next year? No    Does the patient's partner want to become pregnant in the next year? No    Would the patient like to discuss contraceptive options today? No      Contraception Wrap Up   Current Method IUD or IUS    End Method IUD or IUS    Contraception Counseling Provided No         Examination chaperoned by 04/11/21 LPN  Assessment:     1. Epidermal cyst of vulva Will watch for now if grows can excise  2. IUD (intrauterine device) in place -placed 11/30/17    Plan:    She requested refills on kenalog and nystatin Meds ordered this encounter  Medications  . triamcinolone (KENALOG) 0.025 % ointment    Sig: Apply 1 application topically 2 (two) times daily.    Dispense:  30 g    Refill:  2    Order Specific Question:   Supervising  Provider    Answer:   12/02/17, LUTHER H [[id number]]  . nystatin ointment (MYCOSTATIN)    Sig: Apply 1 application topically 2 (two) times daily.    Dispense:  30 g    Refill:  2    Order Specific Question:   Supervising Provider    Answer:   Despina Hidden H [[id number]]  Return in 1 month for pap and physical

## 2021-02-23 ENCOUNTER — Ambulatory Visit (INDEPENDENT_AMBULATORY_CARE_PROVIDER_SITE_OTHER): Payer: Medicaid Other | Admitting: Adult Health

## 2021-02-23 ENCOUNTER — Encounter: Payer: Self-pay | Admitting: Adult Health

## 2021-02-23 ENCOUNTER — Other Ambulatory Visit: Payer: Self-pay

## 2021-02-23 VITALS — BP 139/92 | HR 76 | Ht 67.0 in | Wt 303.6 lb

## 2021-02-23 DIAGNOSIS — L02224 Furuncle of groin: Secondary | ICD-10-CM | POA: Diagnosis not present

## 2021-02-23 MED ORDER — SULFAMETHOXAZOLE-TRIMETHOPRIM 800-160 MG PO TABS: 1.0000 | ORAL_TABLET | Freq: Two times a day (BID) | ORAL | 0 refills | Status: DC

## 2021-02-23 NOTE — Patient Instructions (Signed)

## 2021-02-23 NOTE — Progress Notes (Signed)
  Subjective:     Patient ID: [name], female   DOB: 1986-07-21, 35 y.o.   MRN: [id number]  HPI [name] is a 35 year old black female,single, G1P1 in complaining of ?boil/cyst in groin area, noticed yesterday, tender, had pimple like area yesterday. PCP is Dr Selena Batten.   Review of Systems Has ? Boil/cyst in groin area  Reviewed past medical,surgical, social and family history. Reviewed medications and allergies.     Objective:   Physical Exam BP (!) 139/92 (BP Location: Right Arm, Patient Position: Sitting, Cuff Size: Normal)   Pulse 76   Ht 5\' 7"  (1.702 m)   Wt (!) 303 lb 9.6 oz (137.7 kg)   BMI 47.55 kg/m    Skin warm and dry, has 10 mm area on left side mons pubis is tender and thick, no redness or induration  [facility] - 02/23/21 1135      Pregnancy Intention Screening   Does the patient want to become pregnant in the next year? No    Does the patient's partner want to become pregnant in the next year? No    Would the patient like to discuss contraceptive options today? No      Contraception Wrap Up   Current Method IUD or IUS    End Method IUD or IUS    Contraception Counseling Provided No          Assessment:     1. Boil of groin Apply warm compresses, no dot squeeze  will rx septra ds Meds ordered this encounter  Medications  . sulfamethoxazole-trimethoprim (BACTRIM DS) 800-160 MG tablet    Sig: Take 1 tablet by mouth 2 (two) times daily. Take 1 bid    Dispense:  28 tablet    Refill:  0    Order Specific Question:   Supervising Provider    Answer:   02/25/21 H [[id number]]  Do not shave Review handout on skin abscess     Plan:     Has appt 03/12/21 for pap and physical, will recheck then

## 2021-03-01 DIAGNOSIS — F332 Major depressive disorder, recurrent severe without psychotic features: Secondary | ICD-10-CM | POA: Diagnosis not present

## 2021-03-03 DIAGNOSIS — J309 Allergic rhinitis, unspecified: Secondary | ICD-10-CM | POA: Diagnosis not present

## 2021-03-03 DIAGNOSIS — I10 Essential (primary) hypertension: Secondary | ICD-10-CM | POA: Diagnosis not present

## 2021-03-08 DIAGNOSIS — F410 Panic disorder [episodic paroxysmal anxiety] without agoraphobia: Secondary | ICD-10-CM | POA: Diagnosis not present

## 2021-03-08 DIAGNOSIS — F411 Generalized anxiety disorder: Secondary | ICD-10-CM | POA: Diagnosis not present

## 2021-03-08 DIAGNOSIS — F422 Mixed obsessional thoughts and acts: Secondary | ICD-10-CM | POA: Diagnosis not present

## 2021-03-08 DIAGNOSIS — F4310 Post-traumatic stress disorder, unspecified: Secondary | ICD-10-CM | POA: Diagnosis not present

## 2021-03-08 DIAGNOSIS — F332 Major depressive disorder, recurrent severe without psychotic features: Secondary | ICD-10-CM | POA: Diagnosis not present

## 2021-03-12 ENCOUNTER — Encounter: Payer: Self-pay | Admitting: Adult Health

## 2021-03-12 ENCOUNTER — Other Ambulatory Visit: Payer: Self-pay

## 2021-03-12 ENCOUNTER — Other Ambulatory Visit (HOSPITAL_COMMUNITY)
Admission: RE | Admit: 2021-03-12 | Discharge: 2021-03-12 | Disposition: A | Discharge status: Home / Self Care | Payer: Medicaid Other | Source: Ambulatory Visit | Attending: Adult Health | Admitting: Adult Health

## 2021-03-12 ENCOUNTER — Ambulatory Visit (INDEPENDENT_AMBULATORY_CARE_PROVIDER_SITE_OTHER): Payer: Medicaid Other | Admitting: Adult Health

## 2021-03-12 VITALS — BP 144/92 | HR 88 | Ht 66.5 in | Wt 313.0 lb

## 2021-03-12 DIAGNOSIS — Z975 Presence of (intrauterine) contraceptive device: Secondary | ICD-10-CM

## 2021-03-12 DIAGNOSIS — F32A Depression, unspecified: Secondary | ICD-10-CM | POA: Diagnosis not present

## 2021-03-12 DIAGNOSIS — Z01419 Encounter for gynecological examination (general) (routine) without abnormal findings: Secondary | ICD-10-CM | POA: Diagnosis not present

## 2021-03-12 DIAGNOSIS — F419 Anxiety disorder, unspecified: Secondary | ICD-10-CM

## 2021-03-12 DIAGNOSIS — Z0000 Encounter for general adult medical examination without abnormal findings: Secondary | ICD-10-CM

## 2021-03-12 HISTORY — DX: Encounter for gynecological examination (general) (routine) without abnormal findings: Z01.419

## 2021-03-12 NOTE — Progress Notes (Signed)
Patient ID: [name], female   DOB: 1985/12/26, 35 y.o.   MRN: [id number] History of Present Illness: [name] is a 35 year old black female, single, G1P1, in for well woman pap and physical and recheck boil,it has resolved.  PCP is Dr Selena Batten.   Current Medications, Allergies, Past Medical History, Past Surgical History, Family History and Social History were reviewed in [employer] record.     Review of Systems: Patient denies any headaches, hearing loss, fatigue, blurred vision, shortness of breath, chest pain, abdominal pain, problems with bowel movements, urination, or intercourse(not active). No joint pain or mood swings.    Physical Exam:BP (!) 144/92 (BP Location: Left Arm, Patient Position: Sitting, Cuff Size: Large)   Pulse 88   Ht 5' 6.5" (1.689 m)   Wt (!) 313 lb (142 kg)   BMI 49.76 kg/m  General:  Well developed, well nourished, no acute distress Skin:  Warm and dry Neck:  Midline trachea, normal thyroid, good ROM, no lymphadenopathy Lungs; Clear to auscultation bilaterally Breast:  No dominant palpable mass, retraction, or nipple discharge Cardiovascular: Regular rate and rhythm Abdomen:  Soft, non tender, no hepatosplenomegaly Pelvic:  External genitalia is normal in appearance, no lesions.  The vagina is normal in appearance. Urethra has no lesions or masses. The cervix is bulbous,+IUD stinrgs, pap with GC/CHL and HR HPV genotyping performed.  Uterus is felt to be normal size, shape, and contour.  No adnexal masses or tenderness noted.Bladder is non tender, no masses felt. Extremities/musculoskeletal:  No swelling or varicosities noted, no clubbing or cyanosis Psych:  No mood changes, alert and cooperative,seems happy AA is 2 Fall risk is low  PHQ 9 score is 24, denies any SI, no plans, on meds and sees [facility], weekly and is out of work currently by them GAD 7 score is 21  [facility] - 03/12/21 1131       Pregnancy Intention Screening   Does the patient want to become pregnant in the next year? No    Does the patient's partner want to become pregnant in the next year? No    Would the patient like to discuss contraceptive options today? No      Contraception Wrap Up   Current Method IUD or IUS    End Method IUD or IUS    Contraception Counseling Provided No         Examination chaperoned by Malachy Mood LPN   Impression and Plan: 1. Routine general medical examination at a health care facility Pap sent   2. Encounter for gynecological examination with Papanicolaou smear of cervix Pap sent Physical in 1 year Pap in 3 if normal  3. IUD (intrauterine device) in place Placed 11/30/17  4. Anxiety and depression Sees [facility], and is on meds

## 2021-03-15 DIAGNOSIS — G43709 Chronic migraine without aura, not intractable, without status migrainosus: Secondary | ICD-10-CM | POA: Diagnosis not present

## 2021-03-18 LAB — CYTOLOGY - PAP
Chlamydia: NEGATIVE
Comment: NEGATIVE
Comment: NEGATIVE
Comment: NEGATIVE
Comment: NORMAL
Diagnosis: NEGATIVE
HPV 16: NEGATIVE
HPV 18 / 45: NEGATIVE
High risk HPV: POSITIVE — AB
Neisseria Gonorrhea: NEGATIVE

## 2021-03-22 ENCOUNTER — Encounter: Payer: Self-pay | Admitting: Adult Health

## 2021-03-22 DIAGNOSIS — R87810 Cervical high risk human papillomavirus (HPV) DNA test positive: Secondary | ICD-10-CM

## 2021-03-22 HISTORY — DX: Cervical high risk human papillomavirus (HPV) DNA test positive: R87.810

## 2021-03-25 DIAGNOSIS — H5213 Myopia, bilateral: Secondary | ICD-10-CM | POA: Diagnosis not present

## 2021-04-06 DIAGNOSIS — G4733 Obstructive sleep apnea (adult) (pediatric): Secondary | ICD-10-CM | POA: Diagnosis not present

## 2021-04-06 DIAGNOSIS — G4700 Insomnia, unspecified: Secondary | ICD-10-CM | POA: Diagnosis not present

## 2021-04-06 DIAGNOSIS — E559 Vitamin D deficiency, unspecified: Secondary | ICD-10-CM | POA: Diagnosis not present

## 2021-04-06 DIAGNOSIS — F32A Depression, unspecified: Secondary | ICD-10-CM | POA: Diagnosis not present

## 2021-04-06 DIAGNOSIS — I10 Essential (primary) hypertension: Secondary | ICD-10-CM | POA: Diagnosis not present

## 2021-04-07 DIAGNOSIS — F411 Generalized anxiety disorder: Secondary | ICD-10-CM | POA: Diagnosis not present

## 2021-04-07 DIAGNOSIS — Z9114 Patient's other noncompliance with medication regimen: Secondary | ICD-10-CM | POA: Diagnosis not present

## 2021-04-07 DIAGNOSIS — F422 Mixed obsessional thoughts and acts: Secondary | ICD-10-CM | POA: Diagnosis not present

## 2021-04-07 DIAGNOSIS — F332 Major depressive disorder, recurrent severe without psychotic features: Secondary | ICD-10-CM | POA: Diagnosis not present

## 2021-04-07 DIAGNOSIS — G4709 Other insomnia: Secondary | ICD-10-CM | POA: Insufficient documentation

## 2021-04-07 DIAGNOSIS — F5101 Primary insomnia: Secondary | ICD-10-CM | POA: Insufficient documentation

## 2021-04-07 DIAGNOSIS — F410 Panic disorder [episodic paroxysmal anxiety] without agoraphobia: Secondary | ICD-10-CM | POA: Diagnosis not present

## 2021-04-07 DIAGNOSIS — F4310 Post-traumatic stress disorder, unspecified: Secondary | ICD-10-CM | POA: Diagnosis not present

## 2021-04-28 ENCOUNTER — Ambulatory Visit
Admission: EM | Admit: 2021-04-28 | Discharge: 2021-04-28 | Disposition: A | Discharge status: Home / Self Care | Payer: Medicaid Other | Attending: Family Medicine | Admitting: Family Medicine

## 2021-04-28 ENCOUNTER — Other Ambulatory Visit: Payer: Self-pay

## 2021-04-28 DIAGNOSIS — M79672 Pain in left foot: Secondary | ICD-10-CM

## 2021-04-28 DIAGNOSIS — E1140 Type 2 diabetes mellitus with diabetic neuropathy, unspecified: Secondary | ICD-10-CM | POA: Diagnosis not present

## 2021-04-28 DIAGNOSIS — M79671 Pain in right foot: Secondary | ICD-10-CM

## 2021-04-28 MED ORDER — GABAPENTIN 300 MG PO CAPS: 300.0000 mg | ORAL_CAPSULE | Freq: Three times a day (TID) | ORAL | 0 refills | Status: AC

## 2021-04-28 MED ORDER — FUROSEMIDE 20 MG PO TABS: 20.0000 mg | ORAL_TABLET | Freq: Every day | ORAL | 0 refills | Status: DC

## 2021-04-28 NOTE — Discharge Instructions (Addendum)
I have sent in lasix for you to take one daily  Drink plenty of water   I have changed your gabapentin to 300mg  three times per day for neuropathy  Follow up with this office or with primary care if symptoms are persisting.  Follow up in the ER for high fever, trouble swallowing, trouble breathing, other concerning symptoms.

## 2021-04-28 NOTE — ED Provider Notes (Signed)
[facility]   [id number] 04/28/21 Arrival Time: 9892  JJ:HERDE PAIN  SUBJECTIVE: History from: patient. [name] is a 35 y.o. female complains of bilateral foot pain and tingling that began last week. Has hx type 2 diabetes, neuropathy, HTN, obesity, and more. Has been taking gabapentin 100mg  TID and this usually works well for her, but has not been helping for the last week. Denies a precipitating event or specific injury. States that the right is worse than the left, also reports BLE edema that has also been present for the last week. Reports that blood sugars have been 79-140 at home. Reports BP has been elevated some at home. Reports that she has been taking her HTN meds as prescribed. Describes the pain as constant and burning/tingling in character. Symptoms are made worse with activity. Denies fever, chills, erythema, ecchymosis, weakness, saddle paresthesias, loss of bowel or bladder function.      ROS: As per HPI.  All other pertinent ROS negative.     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    History of kidney stones    Hypertension    IUD (intrauterine device) in place 03/01/2013   Migraines    Neuropathy    Obese    Papanicolaou smear of cervix with positive high risk human papilloma virus (HPV) test 03/22/2021   03/22/21 repeat pap in 1 year per ASCCP guideline, 5 year risk for CIN 3+ is 2.25 %   Polycystic ovarian syndrome    PTSD (post-traumatic stress disorder)    PTSD (post-traumatic stress disorder)    Sleep apnea    Trichomonas contact, treated    Vaginal discharge 12/01/2015   Vitamin D deficiency 03/09/2017   Take 5000 IU vitamin D 3 daily   Yeast  infection 03/01/2013   Past Surgical History:  Procedure Laterality Date   CESAREAN SECTION  09/24/2012   Procedure: CESAREAN SECTION;  Surgeon: 09/26/2012, MD;  Location: [facility];  Service: Gynecology;  Laterality: N/A;   CHOLECYSTECTOMY     CHOLECYSTECTOMY N/A 04/05/2013   Procedure: LAPAROSCOPIC CHOLECYSTECTOMY;  Surgeon: 04/07/2013, MD;  Location: [facility];  Service: General;  Laterality: N/A;   TONSILLECTOMY     No Known Allergies No current facility-administered medications on file prior to encounter.   Current Outpatient Medications on File Prior to Encounter  Medication Sig Dispense Refill   acetaminophen (TYLENOL) 500 MG tablet Take 1,000 mg by mouth as needed.     AIMOVIG 140 MG/ML SOAJ SMARTSIG:140 Milligram(s) SUB-Q     baclofen (LIORESAL) 10 MG tablet Take 10 mg by mouth 3 (three) times daily as needed.     buPROPion (WELLBUTRIN XL) 150 MG 24 hr tablet Take by mouth.     carvedilol (COREG) 3.125 MG tablet Take 3.125 mg by mouth 2 (two) times daily with a meal.     Cholecalciferol 5000 units capsule Take 1 capsule (5,000 Units total) by mouth daily.     cyclobenzaprine (FLEXERIL) 10 MG tablet TAKE ONE TABLET BY MOUTH EVERY 6 HOURS AS NEEDED FOR MUSCLE SPASM 30 tablet 0   DULoxetine (CYMBALTA) 60  MG capsule Take 60 mg by mouth daily.     guanFACINE (TENEX) 1 MG tablet Take 1 mg by mouth at bedtime.     hydrALAZINE (APRESOLINE) 50 MG tablet Take 50 mg by mouth 2 (two) times daily.     hydrochlorothiazide (HYDRODIURIL) 25 MG tablet Take 1 tablet by mouth daily.     hydrOXYzine (ATARAX/VISTARIL) 25 MG tablet Take 12.5-25 mg by mouth 3 (three) times daily as needed.     JARDIANCE 10 MG TABS tablet Take 10 mg by mouth daily.     levonorgestrel (MIRENA) 20 MCG/24HR IUD 1 each by Intrauterine route once.     lisinopril-hydrochlorothiazide (ZESTORETIC) 20-25 MG tablet Take 1 tablet by mouth daily.     metoprolol succinate (TOPROL-XL) 25 MG 24 hr tablet Take by mouth.      metroNIDAZOLE (METROGEL) 0.75 % vaginal gel INSERT 1 APPLICATORFUL VAGINALLY AT BEDTIME FOR  5  NIGHTS.  NO  ALCOHOL  OR  SEX  WHILE  USING. 70 g 0   nystatin ointment (MYCOSTATIN) Apply 1 application topically 2 (two) times daily. 30 g 2   nystatin-triamcinolone ointment (MYCOLOG) Apply 1 application topically 2 (two) times daily. To affected area. 30 g prn   pantoprazole (PROTONIX) 20 MG tablet Take 20 mg by mouth daily.     PROAIR HFA 108 (90 Base) MCG/ACT inhaler SMARTSIG:2 Puff(s) By Mouth Every 4-6 Hours PRN     promethazine (PHENERGAN) 25 MG tablet Take 1 tablet (25 mg total) by mouth every 6 (six) hours as needed. 10 tablet 0   sulfamethoxazole-trimethoprim (BACTRIM DS) 800-160 MG tablet Take 1 tablet by mouth 2 (two) times daily. Take 1 bid 28 tablet 0   SUMAtriptan (IMITREX) 50 MG tablet Take 50 mg by mouth every 2 (two) hours as needed for migraine. May repeat in 2 hours if headache persists or recurs.     traZODone (DESYREL) 50 MG tablet TAKE 1 TABLET BY MOUTH AS NEEDED AT BEDTIME     triamcinolone (KENALOG) 0.025 % ointment Apply 1 application topically 2 (two) times daily. 30 g 2   valACYclovir (VALTREX) 1000 MG tablet Take 1 tablet by mouth once daily 90 tablet 3   Vitamin D, Ergocalciferol, (DRISDOL) 1.25 MG (50000 UT) CAPS capsule Take 50,000 Units by mouth every 7 (seven) days.     Social History   Socioeconomic History   Marital status: Single    Spouse name: Not on file   Number of children: 1   Years of education: Not on file   Highest education level: Not on file  Occupational History   Not on file  Tobacco Use   Smoking status: Former    Years: 3.00    Pack years: 0.00    Types: Cigarettes    Quit date: 01/17/2012    Years since quitting: 9.2   Smokeless tobacco: Never  Vaping Use   Vaping Use: Some days  Substance and Sexual Activity   Alcohol use: Yes    Comment: occasionally    Drug use: No   Sexual activity: Not Currently    Birth control/protection:  I.U.D.  Other Topics Concern   Not on file  Social History Narrative   Not on file   Social Determinants of Health   Financial Resource Strain: High Risk   Difficulty of Paying Living Expenses: Hard  Food Insecurity: Unknown   Worried About Running Out of Food in the Last Year: Patient refused   Ran Out of Food in the Last  Year: Patient refused  Transportation Needs: No Transportation Needs   Lack of Transportation (Medical): No   Lack of Transportation (Non-Medical): No  Physical Activity: Sufficiently Active   Days of Exercise per Week: 7 days   Minutes of Exercise per Session: 30 min  Stress: Stress Concern Present   Feeling of Stress : Very much  Social Connections: Moderately Integrated   Frequency of Communication with Friends and Family: Twice a week   Frequency of Social Gatherings with Friends and Family: Once a week   Attends Religious Services: 1 to 4 times per year   Active Member of [employer] or Organizations: Yes   Attends Engineer, structural: More than 4 times per year   Marital Status: Never married  Catering manager Violence: Not At Risk   Fear of Current or Ex-Partner: No   Emotionally Abused: No   Physically Abused: No   Sexually Abused: No   Family History  Problem Relation Age of Onset   Diabetes Mother    Asthma Brother    Heart murmur Brother    Crohn's disease Brother    Diabetes Maternal Grandmother    Cancer - Lung Maternal Grandmother    Diabetes Maternal Grandfather    Hypertension Maternal Grandfather    Stroke Maternal Grandfather    COPD Paternal Grandfather    Diabetes Maternal Aunt    Hypertension Maternal Aunt    Diabetes Maternal Uncle    Congestive Heart Failure Maternal Uncle    Cancer Paternal Aunt        uterine    OBJECTIVE:  Vitals:   04/28/21 0832  BP: (!) 137/93  Pulse: 72  Resp: 20  Temp: 98.3 F (36.8 C)  SpO2: 96%    General appearance: ALERT; in no acute distress.  Head: NCAT Lungs: Normal  respiratory effort CV: pulses 2+ bilaterally. Cap refill < 2 seconds Musculoskeletal:  Inspection: Skin warm, dry, clear and intact No erythema note BLE +2 non-pitting edema noted Palpation: Bilateral feet diffusely tender to palpation ROM: Limited ROM active and passive to feet and toes Skin: warm and dry Neurologic: Ambulates without difficulty; Sensation intact about the upper/ lower extremities Psychological: alert and cooperative; normal mood and affect  DIAGNOSTIC STUDIES:    ASSESSMENT & PLAN:  1. Neuropathy due to type 2 diabetes mellitus (HCC)   2. Bilateral foot pain     Meds ordered this encounter  Medications   furosemide (LASIX) 20 MG tablet    Sig: Take 1 tablet (20 mg total) by mouth daily.    Dispense:  30 tablet    Refill:  0    Order Specific Question:   Supervising Provider    Answer:   Merrilee Jansky [[id number]]   gabapentin (NEURONTIN) 300 MG capsule    Sig: Take 1 capsule (300 mg total) by mouth 3 (three) times daily.    Dispense:  90 capsule    Refill:  0    Order Specific Question:   Supervising Provider    Answer:   Merrilee Jansky [[id number]]   Stop current gabapentin regimen Start gabapentin 300mg  TID prn neuropathic pain Prescribed lasix 20mg  for BLE edema Continue conservative management of rest, ice, and gentle stretches Follow up with PCP as scheduled Return or go to the ER if you have any new or worsening symptoms (fever, chills, chest pain, abdominal pain, changes in bowel or bladder habits, pain radiating into lower legs)   Reviewed expectations re: course of current medical issues. Questions  answered. Outlined signs and symptoms indicating need for more acute intervention. Patient verbalized understanding. After Visit Summary given.        Moshe CiproMatthews, Glendal Cassaday, NP 04/28/21 0900

## 2021-04-28 NOTE — ED Triage Notes (Signed)
Pt presents with c/o bilateral foot pain for past week, pt states that she takes gabapentin but is not helping. Pt has swelling bilaterally

## 2021-05-04 DIAGNOSIS — R600 Localized edema: Secondary | ICD-10-CM | POA: Diagnosis not present

## 2021-05-04 DIAGNOSIS — I10 Essential (primary) hypertension: Secondary | ICD-10-CM | POA: Diagnosis not present

## 2021-05-07 DIAGNOSIS — F410 Panic disorder [episodic paroxysmal anxiety] without agoraphobia: Secondary | ICD-10-CM | POA: Diagnosis not present

## 2021-05-07 DIAGNOSIS — F332 Major depressive disorder, recurrent severe without psychotic features: Secondary | ICD-10-CM | POA: Diagnosis not present

## 2021-05-07 DIAGNOSIS — F411 Generalized anxiety disorder: Secondary | ICD-10-CM | POA: Diagnosis not present

## 2021-05-17 DIAGNOSIS — F422 Mixed obsessional thoughts and acts: Secondary | ICD-10-CM | POA: Diagnosis not present

## 2021-05-17 DIAGNOSIS — F5101 Primary insomnia: Secondary | ICD-10-CM | POA: Diagnosis not present

## 2021-05-17 DIAGNOSIS — F332 Major depressive disorder, recurrent severe without psychotic features: Secondary | ICD-10-CM | POA: Diagnosis not present

## 2021-05-17 DIAGNOSIS — F411 Generalized anxiety disorder: Secondary | ICD-10-CM | POA: Diagnosis not present

## 2021-05-17 DIAGNOSIS — F4310 Post-traumatic stress disorder, unspecified: Secondary | ICD-10-CM | POA: Diagnosis not present

## 2021-05-17 DIAGNOSIS — F410 Panic disorder [episodic paroxysmal anxiety] without agoraphobia: Secondary | ICD-10-CM | POA: Diagnosis not present

## 2021-06-11 DIAGNOSIS — F422 Mixed obsessional thoughts and acts: Secondary | ICD-10-CM | POA: Diagnosis not present

## 2021-06-11 DIAGNOSIS — F332 Major depressive disorder, recurrent severe without psychotic features: Secondary | ICD-10-CM | POA: Diagnosis not present

## 2021-06-11 DIAGNOSIS — F410 Panic disorder [episodic paroxysmal anxiety] without agoraphobia: Secondary | ICD-10-CM | POA: Diagnosis not present

## 2021-06-11 DIAGNOSIS — F4310 Post-traumatic stress disorder, unspecified: Secondary | ICD-10-CM | POA: Diagnosis not present

## 2021-06-11 DIAGNOSIS — F5101 Primary insomnia: Secondary | ICD-10-CM | POA: Diagnosis not present

## 2021-06-11 DIAGNOSIS — F411 Generalized anxiety disorder: Secondary | ICD-10-CM | POA: Diagnosis not present

## 2021-06-21 DIAGNOSIS — F422 Mixed obsessional thoughts and acts: Secondary | ICD-10-CM | POA: Diagnosis not present

## 2021-06-21 DIAGNOSIS — F332 Major depressive disorder, recurrent severe without psychotic features: Secondary | ICD-10-CM | POA: Diagnosis not present

## 2021-06-21 DIAGNOSIS — F4310 Post-traumatic stress disorder, unspecified: Secondary | ICD-10-CM | POA: Diagnosis not present

## 2021-06-21 DIAGNOSIS — F5101 Primary insomnia: Secondary | ICD-10-CM | POA: Diagnosis not present

## 2021-06-21 DIAGNOSIS — F410 Panic disorder [episodic paroxysmal anxiety] without agoraphobia: Secondary | ICD-10-CM | POA: Diagnosis not present

## 2021-06-21 DIAGNOSIS — F411 Generalized anxiety disorder: Secondary | ICD-10-CM | POA: Diagnosis not present

## 2021-06-22 ENCOUNTER — Encounter: Payer: Self-pay | Admitting: Emergency Medicine

## 2021-06-22 ENCOUNTER — Other Ambulatory Visit: Payer: Self-pay

## 2021-06-22 ENCOUNTER — Ambulatory Visit
Admission: EM | Admit: 2021-06-22 | Discharge: 2021-06-22 | Disposition: A | Discharge status: Home / Self Care | Payer: Medicaid Other

## 2021-06-22 DIAGNOSIS — L02415 Cutaneous abscess of right lower limb: Secondary | ICD-10-CM

## 2021-06-22 DIAGNOSIS — L03115 Cellulitis of right lower limb: Secondary | ICD-10-CM | POA: Diagnosis not present

## 2021-06-22 DIAGNOSIS — L02419 Cutaneous abscess of limb, unspecified: Secondary | ICD-10-CM

## 2021-06-22 MED ORDER — DOXYCYCLINE HYCLATE 100 MG PO CAPS: 100.0000 mg | ORAL_CAPSULE | Freq: Two times a day (BID) | ORAL | 0 refills | Status: DC

## 2021-06-22 MED ORDER — KETOROLAC TROMETHAMINE 30 MG/ML IJ SOLN
30.0000 mg | Freq: Once | INTRAMUSCULAR | Status: AC
  Administered 2021-06-22: 30 mg via INTRAMUSCULAR

## 2021-06-22 MED ORDER — KETOROLAC TROMETHAMINE 60 MG/2ML IM SOLN: 60.0000 mg | Freq: Once | INTRAMUSCULAR | Status: DC

## 2021-06-22 MED ORDER — MUPIROCIN 2 % EX OINT: 1.0000 "application " | TOPICAL_OINTMENT | Freq: Three times a day (TID) | CUTANEOUS | 0 refills | Status: DC

## 2021-06-22 NOTE — ED Triage Notes (Signed)
Pt states that she has an abscess located on her under her right thigh. Pt states that she noticed the abscess  last week and it ruptured last night.

## 2021-06-22 NOTE — ED Provider Notes (Signed)
[facility]    CSN: [id number] Arrival date & time: 06/22/21  0813      History   Chief Complaint Chief Complaint  Patient presents with   Abscess    HPI [name] is a 35 y.o. female.   HPI Patient with a history of type 2 diabetes, obesity, presents today with an concern for possible boil or an abscess localized to her right posterior back.  She reports that she first noticed what appeared to be a small bump 4 days ago.  Over the course of the 4 days the bump has become indurated, surrounding tissue is tender and hard and it has begun to drain pus.  She endorses pain shooting down her leg due to the boil.  She is unaware if she was bitten by anything.  She has had previous boils in the past.  She is afebrile.   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    History of kidney stones    Hypertension    IUD (intrauterine device) in place 03/01/2013   Migraines    Neuropathy    Obese    Papanicolaou smear of cervix with positive high risk human papilloma virus (HPV) test 03/22/2021   03/22/21 repeat pap in 1 year per ASCCP guideline, 5 year risk for CIN 3+ is 2.25 %   Polycystic ovarian syndrome    PTSD (post-traumatic stress disorder)    PTSD (post-traumatic stress disorder)    Sleep apnea    Trichomonas contact, treated    Vaginal discharge 12/01/2015   Vitamin D deficiency 03/09/2017   Take 5000 IU vitamin D 3 daily   Yeast infection 03/01/2013    Patient Active Problem List   Diagnosis Date Noted   Papanicolaou smear of cervix with positive high risk human papilloma virus (HPV) test 03/22/2021   Anxiety and depression 03/12/2021   Encounter for  gynecological examination with Papanicolaou smear of cervix 03/12/2021   Routine general medical examination at a health care facility 03/12/2021   Boil of groin 02/23/2021   IUD (intrauterine device) in place 02/09/2021   Epidermal cyst of vulva 02/09/2021   Vulvar pruritus 08/21/2019   Obesity, morbid, BMI 40.0-49.9 (HCC) 08/21/2019   Vitamin D deficiency 03/09/2017   Elevated hemoglobin A1c 03/09/2017   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   Depression 12/22/2016   Hematuria 12/22/2016   BV (bacterial vaginosis) 12/01/2015   Encounter for insertion of mirena IUD 03/01/2013   Yeast infection 03/01/2013   Sleep apnea 02/28/2013   Essential hypertension, benign 02/14/2013   Gallstone     Past Surgical History:  Procedure Laterality Date   CESAREAN SECTION  09/24/2012   Procedure: CESAREAN SECTION;  Surgeon: Willodean Rosenthal, MD;  Location: [facility];  Service: Gynecology;  Laterality: N/A;   CHOLECYSTECTOMY     CHOLECYSTECTOMY N/A 04/05/2013   Procedure: LAPAROSCOPIC CHOLECYSTECTOMY;  Surgeon: Dalia Heading, MD;  Location: [facility];  Service: General;  Laterality: N/A;   TONSILLECTOMY      OB History     Gravida  1   Para  1   Term  1  Preterm  0   AB  0   Living  1      SAB  0   IAB  0   Ectopic  0   Multiple  0   Live Births  1            Home Medications    Prior to Admission medications   Medication Sig Start Date End Date Taking? Authorizing Provider  buPROPion (WELLBUTRIN XL) 300 MG 24 hr tablet Take by mouth. 06/21/21 07/06/21 Yes [provider]  doxycycline (VIBRAMYCIN) 100 MG capsule Take 1 capsule (100 mg total) by mouth 2 (two) times daily. 06/22/21  Yes Bing NeighborsHarris, Flay Ghosh S, [name]  mupirocin ointment (BACTROBAN) 2 % Apply 1 application topically 3 (three) times daily. 06/22/21  Yes Bing NeighborsHarris, Billy Rocco S, [name]  acetaminophen (TYLENOL) 500 MG tablet Take 1,000 mg by mouth as needed.    [provider]  AIMOVIG 140 MG/ML SOAJ SMARTSIG:140  Milligram(s) SUB-Q 11/10/20   [provider]  baclofen (LIORESAL) 10 MG tablet Take 10 mg by mouth 3 (three) times daily as needed. 11/10/20   [provider]  buPROPion (WELLBUTRIN XL) 150 MG 24 hr tablet Take by mouth. 12/29/20   [provider]  carvedilol (COREG) 3.125 MG tablet Take 3.125 mg by mouth 2 (two) times daily with a meal.    [provider]  Cholecalciferol 5000 units capsule Take 1 capsule (5,000 Units total) by mouth daily. 12/05/17   Adline PotterGriffin, Jennifer A, NP  cyclobenzaprine (FLEXERIL) 10 MG tablet TAKE ONE TABLET BY MOUTH EVERY 6 HOURS AS NEEDED FOR MUSCLE SPASM 01/23/17   Lazaro ArmsEure, Luther H, MD  DULoxetine (CYMBALTA) 60 MG capsule Take 60 mg by mouth daily.    [provider]  furosemide (LASIX) 20 MG tablet Take 1 tablet (20 mg total) by mouth daily. 04/28/21   Moshe CiproMatthews, Stephanie, NP  gabapentin (NEURONTIN) 300 MG capsule Take 1 capsule (300 mg total) by mouth 3 (three) times daily. 04/28/21 05/28/21  Moshe CiproMatthews, Stephanie, NP  guanFACINE (TENEX) 1 MG tablet Take 1 mg by mouth at bedtime.    [provider]  hydrALAZINE (APRESOLINE) 50 MG tablet Take 50 mg by mouth 2 (two) times daily.    [provider]  hydrochlorothiazide (HYDRODIURIL) 25 MG tablet Take 1 tablet by mouth daily. 02/02/21   [provider]  hydrOXYzine (ATARAX/VISTARIL) 25 MG tablet Take 12.5-25 mg by mouth 3 (three) times daily as needed. 12/29/20   [provider]  JARDIANCE 10 MG TABS tablet Take 10 mg by mouth daily. 02/02/21   [provider]  levonorgestrel (MIRENA) 20 MCG/24HR IUD 1 each by Intrauterine route once.    [provider]  lisinopril-hydrochlorothiazide (ZESTORETIC) 20-25 MG tablet Take 1 tablet by mouth daily.    [provider]  metoprolol succinate (TOPROL-XL) 25 MG 24 hr tablet Take by mouth.    [provider]  metroNIDAZOLE (METROGEL) 0.75 % vaginal gel INSERT 1 APPLICATORFUL VAGINALLY AT  BEDTIME FOR  5  NIGHTS.  NO  ALCOHOL  OR  SEX  WHILE  USING. 03/21/19   Cheral MarkerBooker, Bijal Siglin R, CNM  nystatin ointment (MYCOSTATIN) Apply 1 application topically 2 (two) times daily. 02/09/21   Adline PotterGriffin, Jennifer A, NP  nystatin-triamcinolone ointment (MYCOLOG) Apply 1 application topically 2 (two) times daily. To affected area. 07/09/19   Tilda BurrowFerguson, John V, MD  pantoprazole (PROTONIX) 20 MG tablet Take 20 mg by mouth daily. 02/02/21   [provider]  PROAIR HFA 108 (  90 Base) MCG/ACT inhaler SMARTSIG:2 Puff(s) By Mouth Every 4-6 Hours PRN 11/10/20   [provider]  promethazine (PHENERGAN) 25 MG tablet Take 1 tablet (25 mg total) by mouth every 6 (six) hours as needed. 04/23/16   Donnetta Hutching, MD  SUMAtriptan (IMITREX) 50 MG tablet Take 50 mg by mouth every 2 (two) hours as needed for migraine. May repeat in 2 hours if headache persists or recurs.    [provider]  traZODone (DESYREL) 50 MG tablet TAKE 1 TABLET BY MOUTH AS NEEDED AT BEDTIME 11/13/20   [provider]  triamcinolone (KENALOG) 0.025 % ointment Apply 1 application topically 2 (two) times daily. 02/09/21   Adline Potter, NP  valACYclovir (VALTREX) 1000 MG tablet Take 1 tablet by mouth once daily 02/05/21   Adline Potter, NP  Vitamin D, Ergocalciferol, (DRISDOL) 1.25 MG (50000 UT) CAPS capsule Take 50,000 Units by mouth every 7 (seven) days.    [provider]    Family History Family History  Problem Relation Age of Onset   Diabetes Mother    Asthma Brother    Heart murmur Brother    Crohn's disease Brother    Diabetes Maternal Grandmother    Cancer - Lung Maternal Grandmother    Diabetes Maternal Grandfather    Hypertension Maternal Grandfather    Stroke Maternal Grandfather    COPD Paternal Grandfather    Diabetes Maternal Aunt    Hypertension Maternal Aunt    Diabetes Maternal Uncle    Congestive Heart Failure Maternal Uncle    Cancer Paternal Aunt        uterine    Social  History Social History   Tobacco Use   Smoking status: Former    Years: 3.00    Types: Cigarettes    Quit date: 01/17/2012    Years since quitting: 9.4   Smokeless tobacco: Never  Vaping Use   Vaping Use: Some days  Substance Use Topics   Alcohol use: Yes    Comment: occasionally    Drug use: No     Allergies   Patient has no known allergies.   Review of Systems Review of Systems Pertinent negatives listed in HPI   Physical Exam Triage Vital Signs ED Triage Vitals  Enc Vitals Group     BP 06/22/21 0829 (!) 151/98     Pulse Rate 06/22/21 0829 91     Resp 06/22/21 0829 18     Temp 06/22/21 0829 98.1 F (36.7 C)     Temp Source 06/22/21 0829 Oral     SpO2 06/22/21 0829 96 %     Weight --      Height --      Head Circumference --      Peak Flow --      Pain Score 06/22/21 0828 10     Pain Loc --      Pain Edu? --      Excl. in GC? --    No data found.  Updated Vital Signs BP (!) 151/98   Pulse 91   Temp 98.1 F (36.7 C) (Oral)   Resp 18   SpO2 96%   Visual Acuity Right Eye Distance:   Left Eye Distance:   Bilateral Distance:    Right Eye Near:   Left Eye Near:    Bilateral Near:     Physical Exam Constitutional:      Appearance: She is obese.  Cardiovascular:     Rate and Rhythm:  Normal rate and regular rhythm.  Pulmonary:     Effort: Pulmonary effort is normal.     Breath sounds: Normal breath sounds.  Musculoskeletal:       Legs:     Comments: 5 mm erythematous and indurated   Neurological:     Mental Status: She is alert.     UC Treatments / Results  Labs (all labs ordered are listed, but only abnormal results are displayed) Labs Reviewed - No data to display  EKG   Radiology No results found.  Procedures Procedures (including critical care time)  Medications Ordered in UC Medications  ketorolac (TORADOL) 30 MG/ML injection 30 mg (30 mg Intramuscular Given 06/22/21 0938)    Initial Impression / Assessment and Plan /  UC Course  I have reviewed the triage vital signs and the nursing notes.  Pertinent labs & imaging results that were available during my care of the patient were reviewed by me and considered in my medical decision making (see chart for details).    Cellulitis with abscess involving left leg of unknown etiology Treatment per discharge instructions.  Monitor for signs of infection. Wound care instructions provided. Return precautions given.  Final Clinical Impressions(s) / UC Diagnoses   Final diagnoses:  Cellulitis and abscess of leg     Discharge Instructions      Start Doxycyline twice daily for 10 days. Apply Bactroban ointment wound and change dressing daily. Alternate tylenol and ibuprofen for pain. If wound drains, this is an expected finding and indicated healing. If you notice any strong odor or if wound doesn't completely heal following completion of antibiotics, return for evaluation      ED Prescriptions     Medication Sig Dispense Auth. Provider   doxycycline (VIBRAMYCIN) 100 MG capsule Take 1 capsule (100 mg total) by mouth 2 (two) times daily. 20 capsule Bing Neighbors, [name]   mupirocin ointment (BACTROBAN) 2 % Apply 1 application topically 3 (three) times daily. 30 g Bing Neighbors, [name]      PDMP not reviewed this encounter.   Bing Neighbors, [place] 06/22/21 [phone]

## 2021-06-22 NOTE — Discharge Instructions (Addendum)
Start Doxycyline twice daily for 10 days. Apply Bactroban ointment wound and change dressing daily. Alternate tylenol and ibuprofen for pain. If wound drains, this is an expected finding and indicated healing. If you notice any strong odor or if wound doesn't completely heal following completion of antibiotics, return for evaluation

## 2021-06-28 ENCOUNTER — Ambulatory Visit: Payer: Medicaid Other | Admitting: Cardiovascular Disease

## 2021-07-13 DIAGNOSIS — F411 Generalized anxiety disorder: Secondary | ICD-10-CM | POA: Diagnosis not present

## 2021-07-13 DIAGNOSIS — F422 Mixed obsessional thoughts and acts: Secondary | ICD-10-CM | POA: Diagnosis not present

## 2021-07-13 DIAGNOSIS — F410 Panic disorder [episodic paroxysmal anxiety] without agoraphobia: Secondary | ICD-10-CM | POA: Diagnosis not present

## 2021-07-13 DIAGNOSIS — F4310 Post-traumatic stress disorder, unspecified: Secondary | ICD-10-CM | POA: Diagnosis not present

## 2021-07-13 DIAGNOSIS — F330 Major depressive disorder, recurrent, mild: Secondary | ICD-10-CM | POA: Diagnosis not present

## 2021-08-12 DIAGNOSIS — F5101 Primary insomnia: Secondary | ICD-10-CM | POA: Diagnosis not present

## 2021-08-12 DIAGNOSIS — F410 Panic disorder [episodic paroxysmal anxiety] without agoraphobia: Secondary | ICD-10-CM | POA: Diagnosis not present

## 2021-08-12 DIAGNOSIS — F331 Major depressive disorder, recurrent, moderate: Secondary | ICD-10-CM | POA: Diagnosis not present

## 2021-08-12 DIAGNOSIS — F422 Mixed obsessional thoughts and acts: Secondary | ICD-10-CM | POA: Diagnosis not present

## 2021-08-12 DIAGNOSIS — F411 Generalized anxiety disorder: Secondary | ICD-10-CM | POA: Diagnosis not present

## 2021-08-12 DIAGNOSIS — F4310 Post-traumatic stress disorder, unspecified: Secondary | ICD-10-CM | POA: Diagnosis not present

## 2021-08-16 ENCOUNTER — Encounter: Payer: Self-pay | Admitting: Cardiology

## 2021-08-16 ENCOUNTER — Encounter: Payer: Self-pay | Admitting: *Deleted

## 2021-08-16 ENCOUNTER — Ambulatory Visit: Payer: Medicaid Other | Admitting: Cardiology

## 2021-08-16 VITALS — BP 140/88 | HR 82 | Ht 67.0 in | Wt 302.8 lb

## 2021-08-16 DIAGNOSIS — I10 Essential (primary) hypertension: Secondary | ICD-10-CM | POA: Diagnosis not present

## 2021-08-16 DIAGNOSIS — E559 Vitamin D deficiency, unspecified: Secondary | ICD-10-CM | POA: Diagnosis not present

## 2021-08-16 DIAGNOSIS — Z79899 Other long term (current) drug therapy: Secondary | ICD-10-CM | POA: Diagnosis not present

## 2021-08-16 DIAGNOSIS — R011 Cardiac murmur, unspecified: Secondary | ICD-10-CM

## 2021-08-16 DIAGNOSIS — E1140 Type 2 diabetes mellitus with diabetic neuropathy, unspecified: Secondary | ICD-10-CM | POA: Diagnosis not present

## 2021-08-16 DIAGNOSIS — R5383 Other fatigue: Secondary | ICD-10-CM | POA: Diagnosis not present

## 2021-08-16 DIAGNOSIS — E781 Pure hyperglyceridemia: Secondary | ICD-10-CM | POA: Diagnosis not present

## 2021-08-16 NOTE — Progress Notes (Signed)
Cardiology Office Note  Date: 08/16/2021   ID: [name] January 25, 1986, MRN [id number]  PCP:  Angel Grippe, MD  Cardiologist:  Angel Dell, MD Electrophysiologist:  None   Chief Complaint  Patient presents with   Hypertension   Heart Murmur     History of Present Illness: [name] is a 35 y.o. female referred for cardiology consultation by Ms. McCorkle NP with records indicating history of hypertension and cardiac murmur.  She states that she was diagnosed with hypertension and polycystic ovary syndrome at age 89.  She has struggled with weight loss despite exercise and changes in diet over the years.  She has been on various antihypertensive therapy over time, previously Norvasc, temporarily Coreg or Toprol-XL, most recently the regimen noted below.  She does report propensity to holding fluid manifested as leg swelling.  She has no history of valvular heart disease. Echocardiogram from May 2019 revealed LVEF 50 to 55% with trivial mitral regurgitation and trivial tricuspid regurgitation.  I personally reviewed her ECG today which shows normal sinus rhythm.  She just had follow-up lab work obtained pending visit with PCP, we are requesting the results for review.  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    History of kidney stones    Hypertension    IUD (intrauterine device) in place    Migraines    Neuropathy    Obese    Papanicolaou smear of cervix with positive high risk human papilloma virus (HPV) test 03/22/2021   03/22/21 repeat pap in 1 year per ASCCP guideline, 5 year risk for CIN 3+ is 2.25 %   Polycystic ovarian syndrome    PTSD (post-traumatic stress disorder)    Sleep apnea    Trichomonas contact, treated    Type 2 diabetes mellitus (HCC)    Vitamin D deficiency    Yeast infection     Past Surgical History:   Procedure Laterality Date   CESAREAN SECTION  09/24/2012   Procedure: CESAREAN SECTION;  Surgeon: Angel Rosenthal, MD;  Location: [facility];  Service: Gynecology;  Laterality: N/A;   CHOLECYSTECTOMY     CHOLECYSTECTOMY N/A 04/05/2013   Procedure: LAPAROSCOPIC CHOLECYSTECTOMY;  Surgeon: Angel Heading, MD;  Location: [facility];  Service: General;  Laterality: N/A;   TONSILLECTOMY      Current Outpatient Medications  Medication Sig Dispense Refill   acetaminophen (TYLENOL) 500 MG tablet Take 1,000 mg by mouth as needed.     AIMOVIG 140 MG/ML SOAJ SMARTSIG:140 Milligram(s) SUB-Q     baclofen (LIORESAL) 10 MG tablet Take 10 mg by mouth 3 (three) times daily as needed.     buPROPion (WELLBUTRIN XL) 150 MG 24 hr tablet Take by mouth.     buPROPion (WELLBUTRIN XL) 300 MG 24 hr tablet Take by mouth.     Cholecalciferol 5000 units capsule Take 1 capsule (5,000 Units total) by mouth daily.     cyclobenzaprine (FLEXERIL) 10 MG tablet TAKE ONE TABLET BY MOUTH EVERY 6 HOURS AS NEEDED FOR MUSCLE SPASM 30 tablet 0   DULoxetine (CYMBALTA) 60 MG capsule Take 60 mg by mouth daily.     furosemide (LASIX) 20 MG tablet Take 1 tablet (20 mg total) by mouth daily. 30 tablet 0   guanFACINE (TENEX) 1 MG tablet Take 1 mg by  mouth at bedtime.     hydrALAZINE (APRESOLINE) 50 MG tablet Take 50 mg by mouth 2 (two) times daily.     hydrochlorothiazide (HYDRODIURIL) 25 MG tablet Take 1 tablet by mouth daily.     hydrOXYzine (ATARAX/VISTARIL) 50 MG tablet Take 50 mg by mouth 3 (three) times daily.     JARDIANCE 10 MG TABS tablet Take 10 mg by mouth daily.     levonorgestrel (MIRENA) 20 MCG/24HR IUD 1 each by Intrauterine route once.     lisinopril-hydrochlorothiazide (ZESTORETIC) 20-25 MG tablet Take 1 tablet by mouth daily.     metroNIDAZOLE (METROGEL) 0.75 % vaginal gel INSERT 1 APPLICATORFUL VAGINALLY AT BEDTIME FOR  5  NIGHTS.  NO  ALCOHOL  OR  SEX  WHILE  USING. 70 g 0   mupirocin ointment (BACTROBAN) 2 % Apply 1  application topically 3 (three) times daily. 30 g 0   nystatin ointment (MYCOSTATIN) Apply 1 application topically 2 (two) times daily. 30 g 2   nystatin-triamcinolone ointment (MYCOLOG) Apply 1 application topically 2 (two) times daily. To affected area. 30 g prn   pantoprazole (PROTONIX) 20 MG tablet Take 20 mg by mouth daily.     PROAIR HFA 108 (90 Base) MCG/ACT inhaler SMARTSIG:2 Puff(s) By Mouth Every 4-6 Hours PRN     promethazine (PHENERGAN) 25 MG tablet Take 1 tablet (25 mg total) by mouth every 6 (six) hours as needed. 10 tablet 0   SUMAtriptan (IMITREX) 50 MG tablet Take 50 mg by mouth every 2 (two) hours as needed for migraine. May repeat in 2 hours if headache persists or recurs.     traZODone (DESYREL) 50 MG tablet TAKE 1 TABLET BY MOUTH AS NEEDED AT BEDTIME     triamcinolone (KENALOG) 0.025 % ointment Apply 1 application topically 2 (two) times daily. 30 g 2   valACYclovir (VALTREX) 1000 MG tablet Take 1 tablet by mouth once daily 90 tablet 3   Vitamin D, Ergocalciferol, (DRISDOL) 1.25 MG (50000 UT) CAPS capsule Take 50,000 Units by mouth every 7 (seven) days.     gabapentin (NEURONTIN) 300 MG capsule Take 1 capsule (300 mg total) by mouth 3 (three) times daily. 90 capsule 0   No current facility-administered medications for this visit.   Allergies:  Patient has no known allergies.   Social History: The patient  reports that she quit smoking about 9 years ago. Her smoking use included cigarettes. She has never used smokeless tobacco. She reports current alcohol use. She reports that she does not use drugs.   Family History: The patient's family history includes Asthma in her brother; COPD in her paternal grandfather; Cancer in her paternal aunt; Cancer - Lung in her maternal grandmother; Congestive Heart Failure in her maternal uncle; Crohn's disease in her brother; Diabetes in her maternal aunt, maternal grandfather, maternal grandmother, maternal uncle, and mother; Heart murmur in  her brother; Hypertension in her maternal aunt and maternal grandfather; Stroke in her maternal grandfather.   ROS: No palpitations or syncope.  Physical Exam: VS:  BP 140/88   Pulse 82   Ht 5\' 7"  (1.702 m)   Wt (!) 302 lb 12.8 oz (137.3 kg)   SpO2 97%   BMI 47.43 kg/m , BMI Body mass index is 47.43 kg/m.  Wt Readings from Last 3 Encounters:  08/16/21 (!) 302 lb 12.8 oz (137.3 kg)  03/12/21 (!) 313 lb (142 kg)  02/23/21 (!) 303 lb 9.6 oz (137.7 kg)    General: Patient appears comfortable  at rest. HEENT: Conjunctiva and lids normal, wearing a mask. Neck: Supple, no elevated JVP or carotid bruits, no thyromegaly. Lungs: Clear to auscultation, nonlabored breathing at rest. Cardiac: Regular rate and rhythm, no S3, 1/6 systolic murmur, no pericardial rub. Abdomen: Soft, nontender, bowel sounds present. Extremities: Trace ankle edema, distal pulses 2+. Skin: Warm and dry. Musculoskeletal: No kyphosis. Neuropsychiatric: Alert and oriented x3, affect grossly appropriate.  ECG:  An ECG dated 09/21/2015 was personally reviewed today and demonstrated:  Sinus rhythm.  Recent Labwork:    Component Value Date/Time   CHOL 145 03/08/2017 1450   TRIG 180 (H) 03/08/2017 1450   HDL 38 (L) 03/08/2017 1450   CHOLHDL 3.8 03/08/2017 1450   LDLCALC 71 03/08/2017 1450  March 2022: BUN 7, creatinine 0.74, potassium 4.4, cholesterol 159, triglycerides 97, HDL 42, LDL 99, hemoglobin A1c 6.9%, TSH 0.59  Other Studies Reviewed Today:  Echocardiogram 04/06/2018: - Left ventricle: The cavity size was normal. Wall thickness was    normal. Systolic function was normal. The estimated ejection    fraction was in the range of 50% to 55%. Wall motion was normal;    there were no regional wall motion abnormalities. Left    ventricular diastolic function parameters were normal for the    patient&'s age.  - Mitral valve: There was trivial regurgitation.  - Right atrium: Central venous pressure (est): 3 mm  Hg.  - Atrial septum: No defect or patent foramen ovale was identified.  - Tricuspid valve: There was trivial regurgitation.  - Pulmonary arteries: PA peak pressure: 23 mm Hg (S).  - Pericardium, extracardiac: There was no pericardial effusion.   Assessment and Plan:  1.  Longstanding hypertension diagnosed at the time of polycystic ovary syndrome at age 18.  As discussed above she has been on various antihypertensives, currently on lisinopril 20 mg daily, HCTZ 50 mg daily, and hydralazine 50 mg twice daily.  She seems very motivated to make changes and try to get her blood pressure better controlled.  Plan to obtain results of her recent lab work and make a referral to our hypertension clinic at [facility].  2.  Heart murmur, likely benign based on examination.  We will obtain a follow-up echocardiogram in comparison to the previous study of 2019 to also exclude any cardiac structural abnormalities in the setting of chronic elevation in blood pressure and her leg swelling.  3.  Type 2 diabetes mellitus, hemoglobin A1c 6.9% back in March.  She is on Jardiance.  Medication Adjustments/Labs and Tests Ordered: Current medicines are reviewed at length with the patient today.  Concerns regarding medicines are outlined above.   Tests Ordered: Orders Placed This Encounter  Procedures   Ambulatory referral to Cardiology   EKG 12-Lead   ECHOCARDIOGRAM COMPLETE     Medication Changes: No orders of the defined types were placed in this encounter.   Disposition:  Follow up  test results.  Signed, Jonelle Sidle, MD, [facility] 08/16/2021 1:52 PM    [facility] [address] [place], [place], [place] [address] Phone: [phone]; Fax: [phone]

## 2021-08-16 NOTE — Patient Instructions (Addendum)
Medication Instructions:  Your physician recommends that you continue on your current medications as directed. Please refer to the Current Medication list given to you today.  Labwork: none  Testing/Procedures: Your physician has requested that you have an echocardiogram. Echocardiography is a painless test that uses sound waves to create images of your heart. It provides your doctor with information about the size and shape of your heart and how well your heart's chambers and valves are working. This procedure takes approximately one hour. There are no restrictions for this procedure.  Follow-Up: Your physician recommends that you schedule a follow-up appointment in: pending  Any Other Special Instructions Will Be Listed Below (If Applicable). You have been referred to the Hypertension Clinic  If you need a refill on your cardiac medications before your next appointment, please call your pharmacy.

## 2021-08-24 ENCOUNTER — Telehealth: Payer: Self-pay | Admitting: Adult Health

## 2021-08-24 MED ORDER — FLUCONAZOLE 150 MG PO TABS: ORAL_TABLET | ORAL | 1 refills | Status: DC

## 2021-08-24 NOTE — Telephone Encounter (Signed)
Patient calling about her iud and her light periods wanting to ask some questions about retaining moisture wanting someone to call her

## 2021-08-24 NOTE — Addendum Note (Signed)
Addended by: Cyril Mourning A on: 08/24/2021 10:52 AM   Modules accepted: Orders

## 2021-08-24 NOTE — Telephone Encounter (Addendum)
Pt has an IUD. She is not having a normal cycle. She has moisture in vaginal area. + vaginal irritation. Pt has Nystatin ointment and Mycolog. Would she benefit from using either of these creams or does she need to be seen? Can you refill these creams? Please advise. Thanks!! JSY

## 2021-08-24 NOTE — Telephone Encounter (Signed)
Feels wet, irritated,itching will rx diflucan, if not better make appt.

## 2021-08-30 DIAGNOSIS — F419 Anxiety disorder, unspecified: Secondary | ICD-10-CM | POA: Diagnosis not present

## 2021-08-30 DIAGNOSIS — E119 Type 2 diabetes mellitus without complications: Secondary | ICD-10-CM | POA: Diagnosis not present

## 2021-08-30 DIAGNOSIS — I10 Essential (primary) hypertension: Secondary | ICD-10-CM | POA: Diagnosis not present

## 2021-08-30 DIAGNOSIS — E781 Pure hyperglyceridemia: Secondary | ICD-10-CM | POA: Diagnosis not present

## 2021-09-09 ENCOUNTER — Other Ambulatory Visit: Payer: Self-pay

## 2021-09-09 ENCOUNTER — Ambulatory Visit (HOSPITAL_COMMUNITY)
Admission: RE | Admit: 2021-09-09 | Discharge: 2021-09-09 | Disposition: A | Discharge status: Home / Self Care | Payer: Medicaid Other | Source: Ambulatory Visit | Attending: Cardiology | Admitting: Cardiology

## 2021-09-09 ENCOUNTER — Telehealth: Payer: Self-pay | Admitting: *Deleted

## 2021-09-09 DIAGNOSIS — R011 Cardiac murmur, unspecified: Secondary | ICD-10-CM | POA: Insufficient documentation

## 2021-09-09 LAB — ECHOCARDIOGRAM COMPLETE
Area-P 1/2: 3.65 cm2
S' Lateral: 3.6 cm

## 2021-09-09 NOTE — Telephone Encounter (Signed)
-----   Message from Jonelle Sidle, MD sent at 09/09/2021 10:39 AM EDT ----- Results reviewed. LVEF normal at 55-60%, no significant valvular abnormalities.

## 2021-09-09 NOTE — Progress Notes (Signed)
*  PRELIMINARY RESULTS* Echocardiogram 2D Echocardiogram has been performed.  Stacey Drain 09/09/2021, 9:30 AM

## 2021-09-15 NOTE — Telephone Encounter (Signed)
Mychart message sent Copy sen to PCP

## 2021-09-17 DIAGNOSIS — F422 Mixed obsessional thoughts and acts: Secondary | ICD-10-CM | POA: Diagnosis not present

## 2021-09-17 DIAGNOSIS — F331 Major depressive disorder, recurrent, moderate: Secondary | ICD-10-CM | POA: Diagnosis not present

## 2021-09-17 DIAGNOSIS — F4310 Post-traumatic stress disorder, unspecified: Secondary | ICD-10-CM | POA: Diagnosis not present

## 2021-09-17 DIAGNOSIS — F411 Generalized anxiety disorder: Secondary | ICD-10-CM | POA: Diagnosis not present

## 2021-09-17 DIAGNOSIS — F410 Panic disorder [episodic paroxysmal anxiety] without agoraphobia: Secondary | ICD-10-CM | POA: Diagnosis not present

## 2021-09-17 DIAGNOSIS — F5101 Primary insomnia: Secondary | ICD-10-CM | POA: Diagnosis not present

## 2021-09-27 DIAGNOSIS — E119 Type 2 diabetes mellitus without complications: Secondary | ICD-10-CM | POA: Diagnosis not present

## 2021-09-27 DIAGNOSIS — I10 Essential (primary) hypertension: Secondary | ICD-10-CM | POA: Diagnosis not present

## 2021-10-11 DIAGNOSIS — F32A Depression, unspecified: Secondary | ICD-10-CM | POA: Diagnosis not present

## 2021-10-11 DIAGNOSIS — G43709 Chronic migraine without aura, not intractable, without status migrainosus: Secondary | ICD-10-CM | POA: Diagnosis not present

## 2021-10-11 DIAGNOSIS — G4733 Obstructive sleep apnea (adult) (pediatric): Secondary | ICD-10-CM | POA: Diagnosis not present

## 2021-11-03 DIAGNOSIS — G4733 Obstructive sleep apnea (adult) (pediatric): Secondary | ICD-10-CM | POA: Diagnosis not present

## 2021-11-25 ENCOUNTER — Encounter: Payer: Self-pay | Admitting: *Deleted

## 2021-11-25 ENCOUNTER — Encounter (HOSPITAL_BASED_OUTPATIENT_CLINIC_OR_DEPARTMENT_OTHER): Payer: Self-pay | Admitting: Cardiovascular Disease

## 2021-11-25 ENCOUNTER — Other Ambulatory Visit: Payer: Self-pay

## 2021-11-25 ENCOUNTER — Ambulatory Visit (HOSPITAL_BASED_OUTPATIENT_CLINIC_OR_DEPARTMENT_OTHER): Payer: Medicaid Other | Admitting: Cardiovascular Disease

## 2021-11-25 VITALS — BP 168/118 | HR 71 | Ht 67.0 in | Wt 300.3 lb

## 2021-11-25 DIAGNOSIS — Z006 Encounter for examination for normal comparison and control in clinical research program: Secondary | ICD-10-CM

## 2021-11-25 DIAGNOSIS — Z5181 Encounter for therapeutic drug level monitoring: Secondary | ICD-10-CM | POA: Diagnosis not present

## 2021-11-25 DIAGNOSIS — R0602 Shortness of breath: Secondary | ICD-10-CM | POA: Insufficient documentation

## 2021-11-25 DIAGNOSIS — I10 Essential (primary) hypertension: Secondary | ICD-10-CM

## 2021-11-25 DIAGNOSIS — G4733 Obstructive sleep apnea (adult) (pediatric): Secondary | ICD-10-CM | POA: Diagnosis not present

## 2021-11-25 MED ORDER — SPIRONOLACTONE 25 MG PO TABS: 25.0000 mg | ORAL_TABLET | Freq: Every day | ORAL | 3 refills | Status: DC

## 2021-11-25 NOTE — Patient Instructions (Addendum)
Medication Instructions:  HOLD YOUR LISINOPRIL-HCT Saturday AND Sunday GO FOR LABS Monday MORNING  AFTER LABS  RESUME LISINOPRIL-HCT  STOP HYDROCHLOROTHIAZIDE  START SPIRONOLACTONE 25 MG DAILY   Labwork: LABS FIRST THING Monday MORNING  AFTER STARTING NEW MEDICATIONS LABS(BMET) IN 1 WEEK    Testing/Procedures: NONE   Follow-Up: 12/27/2021 9:00 AM WITH PHARM D AT [facility]   You will receive a phone call from the [facility] exercise and nutrition program to schedule an initial assessment.   Special Instructions:   MONITOR BLOOD PRESSURE TWICE A DAY WITH MACHINE PROVIDED  LOG IN BOOK PROVIDED AND BRING TO FOLLOW UP   [facility] OUR CARE GUIDE WILL REACH OUT TO YOU FOR STRESS MANAGEMENT   DASH Eating Plan DASH stands for "Dietary Approaches to Stop Hypertension." The DASH eating plan is a healthy eating plan that has been shown to reduce high blood pressure (hypertension). It may also reduce your risk for type 2 diabetes, heart disease, and stroke. The DASH eating plan may also help with weight loss. What are tips for following this plan?  General guidelines Avoid eating more than 2,300 mg (milligrams) of salt (sodium) a day. If you have hypertension, you may need to reduce your sodium intake to 1,500 mg a day. Limit alcohol intake to no more than 1 drink a day for nonpregnant women and 2 drinks a day for men. One drink equals 12 oz of beer, 5 oz of wine, or 1 oz of hard liquor. Work with your health care provider to maintain a healthy body weight or to lose weight. Ask what an ideal weight is for you. Get at least 30 minutes of exercise that causes your heart to beat faster (aerobic exercise) most days of the week. Activities may include walking, swimming, or biking. Work with your health care provider or diet and nutrition specialist (dietitian) to adjust your eating plan to your individual calorie needs. Reading food labels  Check food labels for the amount of sodium per serving.  Choose foods with less than 5 percent of the Daily Value of sodium. Generally, foods with less than 300 mg of sodium per serving fit into this eating plan. To find whole grains, look for the word "whole" as the first word in the ingredient list. Shopping Buy products labeled as "low-sodium" or "no salt added." Buy fresh foods. Avoid canned foods and premade or frozen meals. Cooking Avoid adding salt when cooking. Use salt-free seasonings or herbs instead of table salt or sea salt. Check with your health care provider or pharmacist before using salt substitutes. Do not fry foods. Cook foods using healthy methods such as baking, boiling, grilling, and broiling instead. Cook with heart-healthy oils, such as olive, canola, soybean, or sunflower oil. Meal planning Eat a balanced diet that includes: 5 or more servings of fruits and vegetables each day. At each meal, try to fill half of your plate with fruits and vegetables. Up to 6-8 servings of whole grains each day. Less than 6 oz of lean meat, poultry, or fish each day. A 3-oz serving of meat is about the same size as a deck of cards. One egg equals 1 oz. 2 servings of low-fat dairy each day. A serving of nuts, seeds, or beans 5 times each week. Heart-healthy fats. Healthy fats called Omega-3 fatty acids are found in foods such as flaxseeds and coldwater fish, like sardines, salmon, and mackerel. Limit how much you eat of the following: Canned or prepackaged foods. Food that is high  in trans fat, such as fried foods. Food that is high in saturated fat, such as fatty meat. Sweets, desserts, sugary drinks, and other foods with added sugar. Full-fat dairy products. Do not salt foods before eating. Try to eat at least 2 vegetarian meals each week. Eat more home-cooked food and less restaurant, buffet, and fast food. When eating at a restaurant, ask that your food be prepared with less salt or no salt, if possible. What foods are recommended? The  items listed may not be a complete list. Talk with your dietitian about what dietary choices are best for you. Grains Whole-grain or whole-wheat bread. Whole-grain or whole-wheat pasta. Brown rice. Orpah Cobb. Bulgur. Whole-grain and low-sodium cereals. Pita bread. Low-fat, low-sodium crackers. Whole-wheat flour tortillas. Vegetables Fresh or frozen vegetables (raw, steamed, roasted, or grilled). Low-sodium or reduced-sodium tomato and vegetable juice. Low-sodium or reduced-sodium tomato sauce and tomato paste. Low-sodium or reduced-sodium canned vegetables. Fruits All fresh, dried, or frozen fruit. Canned fruit in natural juice (without added sugar). Meat and other protein foods Skinless chicken or [place]. Ground chicken or [place]. Pork with fat trimmed off. Fish and seafood. Egg whites. Dried beans, peas, or lentils. Unsalted nuts, nut butters, and seeds. Unsalted canned beans. Lean cuts of beef with fat trimmed off. Low-sodium, lean deli meat. Dairy Low-fat (1%) or fat-free (skim) milk. Fat-free, low-fat, or reduced-fat cheeses. Nonfat, low-sodium ricotta or cottage cheese. Low-fat or nonfat yogurt. Low-fat, low-sodium cheese. Fats and oils Soft margarine without trans fats. Vegetable oil. Low-fat, reduced-fat, or light mayonnaise and salad dressings (reduced-sodium). Canola, safflower, olive, soybean, and sunflower oils. Avocado. Seasoning and other foods Herbs. Spices. Seasoning mixes without salt. Unsalted popcorn and pretzels. Fat-free sweets. What foods are not recommended? The items listed may not be a complete list. Talk with your dietitian about what dietary choices are best for you. Grains Baked goods made with fat, such as croissants, muffins, or some breads. Dry pasta or rice meal packs. Vegetables Creamed or fried vegetables. Vegetables in a cheese sauce. Regular canned vegetables (not low-sodium or reduced-sodium). Regular canned tomato sauce and paste (not low-sodium or  reduced-sodium). Regular tomato and vegetable juice (not low-sodium or reduced-sodium). Rosita Fire. Olives. Fruits Canned fruit in a light or heavy syrup. Fried fruit. Fruit in cream or butter sauce. Meat and other protein foods Fatty cuts of meat. Ribs. Fried meat. Tomasa Blase. Sausage. Bologna and other processed lunch meats. Salami. Fatback. Hotdogs. Bratwurst. Salted nuts and seeds. Canned beans with added salt. Canned or smoked fish. Whole eggs or egg yolks. Chicken or [place] with skin. Dairy Whole or 2% milk, cream, and half-and-half. Whole or full-fat cream cheese. Whole-fat or sweetened yogurt. Full-fat cheese. Nondairy creamers. Whipped toppings. Processed cheese and cheese spreads. Fats and oils Butter. Stick margarine. Lard. Shortening. Ghee. Bacon fat. Tropical oils, such as coconut, palm kernel, or palm oil. Seasoning and other foods Salted popcorn and pretzels. Onion salt, garlic salt, seasoned salt, table salt, and sea salt. Worcestershire sauce. Tartar sauce. Barbecue sauce. Teriyaki sauce. Soy sauce, including reduced-sodium. Steak sauce. Canned and packaged gravies. Fish sauce. Oyster sauce. Cocktail sauce. Horseradish that you find on the shelf. Ketchup. Mustard. Meat flavorings and tenderizers. Bouillon cubes. Hot sauce and Tabasco sauce. Premade or packaged marinades. Premade or packaged taco seasonings. Relishes. Regular salad dressings. Where to find more information: National Heart, Lung, and Blood Institute: PopSteam.is American Heart Association: www.heart.org Summary The DASH eating plan is a healthy eating plan that has been shown to reduce high blood pressure (hypertension).  It may also reduce your risk for type 2 diabetes, heart disease, and stroke. With the DASH eating plan, you should limit salt (sodium) intake to 2,300 mg a day. If you have hypertension, you may need to reduce your sodium intake to 1,500 mg a day. When on the DASH eating plan, aim to eat more fresh  fruits and vegetables, whole grains, lean proteins, low-fat dairy, and heart-healthy fats. Work with your health care provider or diet and nutrition specialist (dietitian) to adjust your eating plan to your individual calorie needs. This information is not intended to replace advice given to you by your health care provider. Make sure you discuss any questions you have with your health care provider. Document Released: 10/13/2011 Document Revised: 10/06/2017 Document Reviewed: 10/17/2016 Elsevier Patient Education  2020 [employer]

## 2021-11-25 NOTE — Research (Signed)
Subject Name: [name] met inclusion and exclusion criteria for the Virtual Care and Social Determinant Interventions for the management of hypertension trial.  The informed consent form, study requirements and expectations were reviewed with the subject by Dr. Oval Linsey and myself. The subject was given the opportunity to read the consent and ask questions. The subject verbalized understanding of the trial requirements.  All questions were addressed prior to the signing of the consent form. The subject agreed to participate in the trial and signed the informed consent. The informed consent was obtained prior to performance of any protocol-specific procedures for the subject.  A copy of the signed informed consent was given to the subject and a copy was placed in the subject's medical record.  [name] was randomized to Group 1.

## 2021-11-25 NOTE — Addendum Note (Signed)
Addended by: Skeet Latch C on: 11/25/2021 12:09 PM   Modules accepted: Orders

## 2021-11-25 NOTE — Assessment & Plan Note (Signed)
Referral to [facility]. 

## 2021-11-25 NOTE — Progress Notes (Signed)
[facility] Initial Assessment:    Date:  11/25/2021   ID:  [name], DOB 09-07-1986, MRN [id number]  PCP:  Jani Gravel, MD  Cardiologist:  Rozann Lesches, MD  Nephrologist:  Referring MD: Satira Sark, MD   CC: Hypertension  History of Present Illness:    [name] is a 36 y.o. female with a hx of hypertension, sleep apnea, PCOS, and diabetes mellitus type 2 here to establish care in the [facility]. She saw Dr. Domenic Polite 08/2021 for hypertension and a murmur. At that appointment, her blood pressure was 140/88 on lisinopril, HCTZ, and hydralazine. Given that she was first diagnosed with hypertension at 15 she was referred to the HTN clinic and discussed lifestyle intervention. Echo 09/2021 revealed LVEF 55-60% and mild LVH. She had normal diastolic function.   Today, she is doing well. She has had high blood pressure since she was 36 years old. She reports she was initially diagnosed with PCOS and placed on HCTZ and lisinopril. She previously took Norvasc and another medication she cannot recall at 36 years old. Since then, she has struggled to control her blood pressure. She records her blood pressure at home and reports measurements of [id number] systolic in the morning with the highest diastolic measurement of [id number]. She takes her blood pressure in the morning prior to eating and at night before going to bed. Her most recent measurements were 124/76 and 156/96. 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 She does [employer] MWF and uses a standing desk at home. She endorses shortness of breath while exercising. She was infected with COVID for the third time in 04/2021 and noticed her breathing has been more labored since. Occasionally, she has palpitation episodes after exertion with associated shortness of breath. Going up the stairs, she has to take a  break to catch her breath before entering her apartment. However, the palpitations can also occur suddenly while sitting. Her bilateral LE occasionally swell and she takes her lasix as needed. She endorses shortness of breath when laying down. However, she recently received her CPAP and has been tolerating it. She cooks at home and watches her salt intake. She enjoys a coffee daily and occasional alcoholic drink. She quit smoking last year. For supplements, she takes B-12 and vitamin D. Her mother and father both take medication for hypertension. Her father started taking medications when she was 36 years old. Her maternal grandfather had high blood pressure and stroke and her maternal aunts also have hypertension. She denies any chest pain, lightheadedness, headaches, syncope, or PND.  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    History of kidney stones    Hypertension    IUD (intrauterine device) in place    Migraines    Neuropathy    Obese    Papanicolaou smear of cervix with positive high risk human papilloma virus (HPV) test 03/22/2021   03/22/21 repeat pap in 1 year per ASCCP guideline, 5 year risk for CIN 3+ is 2.25 %   Polycystic ovarian syndrome    PTSD (post-traumatic stress disorder)    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   Sleep apnea    Trichomonas contact, treated  Type 2 diabetes mellitus ([place])    Vitamin D deficiency    Yeast infection     Past Surgical History:  Procedure Laterality Date   CESAREAN SECTION  09/24/2012   Procedure: CESAREAN SECTION;  Surgeon: Lavonia Drafts, MD;  Location: [facility];  Service: Gynecology;  Laterality: N/A;   CHOLECYSTECTOMY     CHOLECYSTECTOMY N/A 04/05/2013   Procedure: LAPAROSCOPIC CHOLECYSTECTOMY;  Surgeon: Jamesetta So, MD;  Location: [facility];   Service: General;  Laterality: N/A;   TONSILLECTOMY      Current Medications: Current Meds  Medication Sig   acetaminophen (TYLENOL) 500 MG tablet Take 1,000 mg by mouth as needed.   AIMOVIG 140 MG/ML SOAJ SMARTSIG:140 Milligram(s) SUB-Q   amLODipine (NORVASC) 5 MG tablet Take 1 tablet by mouth daily.   baclofen (LIORESAL) 10 MG tablet Take 10 mg by mouth 3 (three) times daily as needed.   buPROPion (WELLBUTRIN XL) 150 MG 24 hr tablet Take by mouth.   buPROPion (WELLBUTRIN XL) 300 MG 24 hr tablet Take by mouth.   Cholecalciferol 5000 units capsule Take 1 capsule (5,000 Units total) by mouth daily.   cyclobenzaprine (FLEXERIL) 10 MG tablet TAKE ONE TABLET BY MOUTH EVERY 6 HOURS AS NEEDED FOR MUSCLE SPASM   DULoxetine (CYMBALTA) 60 MG capsule Take 60 mg by mouth daily.   fluconazole (DIFLUCAN) 150 MG tablet Take 1 now and 1 in 3 days   furosemide (LASIX) 20 MG tablet Take 1 tablet (20 mg total) by mouth daily.   gabapentin (NEURONTIN) 300 MG capsule Take 1 capsule (300 mg total) by mouth 3 (three) times daily.   guanFACINE (TENEX) 1 MG tablet Take 1 mg by mouth at bedtime.   hydrALAZINE (APRESOLINE) 100 MG tablet Take 1 tablet by mouth in the morning, at noon, and at bedtime.   hydrochlorothiazide (HYDRODIURIL) 25 MG tablet Take 1 tablet by mouth daily.   hydrOXYzine (ATARAX/VISTARIL) 50 MG tablet Take 50 mg by mouth 3 (three) times daily.   JARDIANCE 10 MG TABS tablet Take 10 mg by mouth daily.   levonorgestrel (MIRENA) 20 MCG/24HR IUD 1 each by Intrauterine route once.   lisinopril-hydrochlorothiazide (ZESTORETIC) 20-25 MG tablet Take 1 tablet by mouth daily.   metroNIDAZOLE (METROGEL) 0.75 % vaginal gel INSERT 1 APPLICATORFUL VAGINALLY AT BEDTIME FOR  5  NIGHTS.  NO  ALCOHOL  OR  SEX  WHILE  USING.   montelukast (SINGULAIR) 10 MG tablet Take 1 tablet by mouth daily as needed.   mupirocin ointment (BACTROBAN) 2 % Apply 1 application topically 3 (three) times daily.   nystatin ointment  (MYCOSTATIN) Apply 1 application topically 2 (two) times daily.   nystatin-triamcinolone ointment (MYCOLOG) Apply 1 application topically 2 (two) times daily. To affected area.   pantoprazole (PROTONIX) 20 MG tablet Take 20 mg by mouth daily.   PROAIR HFA 108 (90 Base) MCG/ACT inhaler SMARTSIG:2 Puff(s) By Mouth Every 4-6 Hours PRN   promethazine (PHENERGAN) 25 MG tablet Take 1 tablet (25 mg total) by mouth every 6 (six) hours as needed.   SUMAtriptan (IMITREX) 100 MG tablet Take 1 tablet by mouth as needed.   traZODone (DESYREL) 50 MG tablet TAKE 1 TABLET BY MOUTH AS NEEDED AT BEDTIME   triamcinolone (KENALOG) 0.025 % ointment Apply 1 application topically 2 (two) times daily.   valACYclovir (VALTREX) 1000 MG tablet Take 1 tablet by mouth once daily   Vitamin D, Ergocalciferol, (DRISDOL) 1.25 MG (50000 UT) CAPS capsule Take 50,000 Units by mouth every 7 (  seven) days.   [DISCONTINUED] baclofen (LIORESAL) 10 MG tablet Take 1 tablet by mouth 3 (three) times daily as needed.     Allergies:   Patient has no known allergies.   Social History   Socioeconomic History   Marital status: Single    Spouse name: Not on file   Number of children: 1   Years of education: Not on file   Highest education level: Not on file  Occupational History   Not on file  Tobacco Use   Smoking status: Former    Years: 3.00    Types: Cigarettes    Quit date: 01/17/2012    Years since quitting: 9.8   Smokeless tobacco: Never  Vaping Use   Vaping Use: Some days  Substance and Sexual Activity   Alcohol use: Yes    Comment: occasionally    Drug use: No   Sexual activity: Not Currently    Birth control/protection: I.U.D.  Other Topics Concern   Not on file  Social History Narrative   Not on file   Social Determinants of Health   Financial Resource Strain: High Risk   Difficulty of Paying Living Expenses: Hard  Food Insecurity: Unknown   Worried About Running Out of Food in the Last Year: Patient  refused   [place] in the Last Year: Patient refused  Transportation Needs: No Transportation Needs   Lack of Transportation (Medical): No   Lack of Transportation (Non-Medical): No  Physical Activity: Sufficiently Active   Days of Exercise per Week: 7 days   Minutes of Exercise per Session: 30 min  Stress: Stress Concern Present   Feeling of Stress : Very much  Social Connections: Moderately Integrated   Frequency of Communication with Friends and Family: Twice a week   Frequency of Social Gatherings with Friends and Family: Once a week   Attends Religious Services: 1 to 4 times per year   Active Member of [employer] or Organizations: Yes   Attends Music therapist: More than 4 times per year   Marital Status: Never married     Family History: The patient's family history includes Asthma in her brother; COPD in her paternal grandfather; Cancer in her paternal aunt; Cancer - Lung in her maternal grandmother; Congestive Heart Failure in her maternal uncle; Crohn's disease in her brother; Diabetes in her maternal aunt, maternal grandfather, maternal grandmother, maternal uncle, and mother; Heart murmur in her brother; Hypertension in her father, maternal aunt, maternal grandfather, and mother; Stroke in her maternal grandfather.  ROS:   Please see the history of present illness.    (+) Shortness of breath (+) LE edema (+) Palpitations All other systems reviewed and are negative.  EKGs/Labs/Other Studies Reviewed:    Echo 09/09/21 1. Left ventricular ejection fraction, by estimation, is 55 to 60%. The left ventricle has normal function. The left ventricle has no regional wall motion abnormalities. There is mild left ventricular hypertrophy. Left ventricular diastolic parameters  were normal.   2. Right ventricular systolic function is normal. The right ventricular size is normal. Tricuspid regurgitation signal is inadequate for assessing PA pressure.   3. Left atrial  size was upper normal.   4. The mitral valve is grossly normal. Trivial mitral valve  regurgitation.   5. The aortic valve is tricuspid. Aortic valve regurgitation is not  visualized.   6. The inferior vena cava is normal in size with greater than 50%  respiratory variability, suggesting right atrial pressure of 3 mmHg.  Comparison(s): Prior images reviewed side by side. LVEF is normal  Echo 04/06/18 - Left ventricle: The cavity size was normal. Wall thickness was    normal. Systolic function was normal. The estimated ejection    fraction was in the range of 50% to 55%. Wall motion was normal; there were no regional wall motion abnormalities. Left    ventricular diastolic function parameters were normal for the    patient&'s age.  - Mitral valve: There was trivial regurgitation.  - Right atrium: Central venous pressure (est): 3 mm Hg.  - Atrial septum: No defect or patent foramen ovale was identified.  - Tricuspid valve: There was trivial regurgitation.  - Pulmonary arteries: PA peak pressure: 23 mm Hg (S).  - Pericardium, extracardiac: There was no pericardial effusion.   EKG:  EKG was not ordered today  Recent Labs: No results found for requested labs within last 8760 hours.   Recent Lipid Panel    Component Value Date/Time   CHOL 145 03/08/2017 1450   TRIG 180 (H) 03/08/2017 1450   HDL 38 (L) 03/08/2017 1450   CHOLHDL 3.8 03/08/2017 1450   LDLCALC 71 03/08/2017 1450    Physical Exam:   VS:  BP (!) 168/118 (BP Location: Left Arm, Patient Position: Sitting, Cuff Size: Large)    Pulse 71    Ht 5\' 7"  (1.702 m)    Wt (!) 300 lb 4.8 oz (136.2 kg)    SpO2 100%    BMI 47.03 kg/m  , BMI Body mass index is 47.03 kg/m. GENERAL:  Well appearing HEENT: Pupils equal round and reactive, fundi not visualized, oral mucosa unremarkable NECK:  No jugular venous distention, waveform within normal limits, carotid upstroke brisk and symmetric, no bruits, no thyromegaly LYMPHATICS:  No  cervical adenopathy LUNGS:  Clear to auscultation bilaterally HEART:  RRR.  PMI not displaced or sustained,S1 and S2 within normal limits, no S3, no S4, no clicks, no rubs, no murmurs ABD:  Flat, positive bowel sounds normal in frequency in pitch, no bruits, no rebound, no guarding, no midline pulsatile mass, no hepatomegaly, no splenomegaly EXT:  2 plus pulses throughout, trace edema, no cyanosis no clubbing SKIN:  No rashes no nodules NEURO:  Cranial nerves II through XII grossly intact, motor grossly intact throughout PSYCH:  Cognitively intact, oriented to person place and time   ASSESSMENT/PLAN:    Resistant hypertension First diagnosed with hypertension at age 81 in the setting of PCOS.  She had a CT abd/pelvis in 2012 that was negative for renal artery stenosis or fibromuscular dysplasia.  She did not have any adenomas at that time.  She has had hypokalemia.  We will have her hold the lisinopril/HCTZ over the weekend and come back for renal in and aldosterone levels.  We will also check catecholamines and metanephrines given her history of palpitations and flushing.  We will check a cortisol and TSH.  On Monday she will stop HCTZ 25 mg and continue the HCTZ/lisinopril 25/20 mg.  Check a basic metabolic panel in a week after starting spironolactone 25 mg daily.  Continue hydralazine.  She is had lower extremity edema and has to wear compression stockings so we will not resume amlodipine at this time.  She was congratulated on her exercise routine.  We will refer her to the PREP program at the [facility] given that it is closer to her home and she will likely benefit from the educational portion.  She was encouraged to continue abstaining from smoking.  We will have our care guide reach out to her for stress management.  She is interested in our remote patient monitoring program and consents to monitoring through the [employer] remote patient monitoring system.  Continue using CPAP.  Sleep  apnea Continue CPAP.  Obesity, morbid, BMI 40.0-49.9 ([place]) Referral to PREP.  Shortness of breath She has been short of breath despite exercising regularly.  Echo was unremarkable.  She had COVID-19.  Likely related to lung disease.   Screening for Secondary Hypertension:  Causes 11/25/2021  Drugs/Herbals Screened     - Comments limits sodium intake.  Cooks at home.  1-2 coffees daily.  Minimal EtOH.  Renovascular HTN Screened     - Comments Check renal artery Dopplers  Sleep Apnea Screened     - Comments On CPAP  Thyroid Disease Screened     - Comments Check TSH  Hyperaldosteronism Screened     - Comments Check reninin/aldo  Pheochromocytoma Screened     - Comments check catecholamines and metanephrines  Cushing's Syndrome Screened     - Comments Check cortisol  Hyperparathyroidism Screened     - Comments Check calcium  Coarctation of the Aorta Screened     - Comments BP symmetric  Compliance Screened    Relevant Labs/Studies: Basic Labs Latest Ref Rng & Units 12/04/2017 03/08/2017 04/23/2016  Sodium 135 - 145 mmol/L 142 140 139  Potassium 3.5 - 5.1 mmol/L 3.3(L) 4.3 3.2(L)  Creatinine 0.44 - 1.00 mg/dL 0.66 0.82 0.63    Thyroid  Latest Ref Rng & Units 03/08/2017  TSH 0.450 - 4.500 uIU/mL 1.220    Time spent: 45 minutes-Greater than 50% of this time was spent in counseling, explanation of diagnosis, planning of further management, and coordination of care.    Disposition:  FU with APP in 1-2 months FU with Georgette Helmer C. Oval Linsey, MD, [facility] in 4 months  Medication Adjustments/Labs and Tests Ordered: Current medicines are reviewed at length with the patient today.  Concerns regarding medicines are outlined above.  No orders of the defined types were placed in this encounter.  No orders of the defined types were placed in this encounter.  I,Mykaella Javier,acting as a scribe for Skeet Latch, MD.,have documented all relevant documentation on the behalf of Skeet Latch, MD,as directed by  Skeet Latch, MD while in the presence of Skeet Latch, MD.  I, Edna Oval Linsey, MD have reviewed all documentation for this visit.  The documentation of the exam, diagnosis, procedures, and orders on 11/25/2021 are all accurate and complete.   Signed, Skeet Latch, MD  11/25/2021 9:32 AM    [place]

## 2021-11-25 NOTE — Assessment & Plan Note (Addendum)
First diagnosed with hypertension at age 36 in the setting of PCOS.  She had a CT abd/pelvis in 2012 that was negative for renal artery stenosis or fibromuscular dysplasia.  She did not have any adenomas at that time.  She has had hypokalemia.  We will have her hold the lisinopril/HCTZ over the weekend and come back for renal in and aldosterone levels.  We will also check catecholamines and metanephrines given her history of palpitations and flushing.  We will check a cortisol and TSH.  On Monday she will stop HCTZ 25 mg and continue the HCTZ/lisinopril 25/20 mg.  Check a basic metabolic panel in a week after starting spironolactone 25 mg daily.  Continue hydralazine.  She is had lower extremity edema and has to wear compression stockings so we will not resume amlodipine at this time.  She was congratulated on her exercise routine.  We will refer her to the PREP program at the [facility] given that it is closer to her home and she will likely benefit from the educational portion.  She was encouraged to continue abstaining from smoking.  We will have our care guide reach out to her for stress management.  She is interested in our remote patient monitoring program and consents to monitoring through the [employer] remote patient monitoring system.  Continue using CPAP.

## 2021-11-25 NOTE — Assessment & Plan Note (Signed)
She has been short of breath despite exercising regularly.  Echo was unremarkable.  She had COVID-19.  Likely related to lung disease.

## 2021-11-25 NOTE — Assessment & Plan Note (Signed)
Continue CPAP.  

## 2021-11-26 ENCOUNTER — Encounter (HOSPITAL_BASED_OUTPATIENT_CLINIC_OR_DEPARTMENT_OTHER): Payer: Self-pay

## 2021-11-30 DIAGNOSIS — I10 Essential (primary) hypertension: Secondary | ICD-10-CM | POA: Diagnosis not present

## 2021-12-04 DIAGNOSIS — G4733 Obstructive sleep apnea (adult) (pediatric): Secondary | ICD-10-CM | POA: Diagnosis not present

## 2021-12-06 LAB — METANEPHRINES, PLASMA
Metanephrine, Free: 15.7 pg/mL (ref 0.0–88.0)
Normetanephrine, Free: 31.9 pg/mL (ref 0.0–210.1)

## 2021-12-06 LAB — ALDOSTERONE + RENIN ACTIVITY W/ RATIO
ALDOS/RENIN RATIO: 21.4 (ref 0.0–30.0)
ALDOSTERONE: 6.1 ng/dL (ref 0.0–30.0)
Renin: 0.285 ng/mL/hr (ref 0.167–5.380)

## 2021-12-06 LAB — CATECHOLAMINES, FRACTIONATED, PLASMA
Dopamine: <30 pg/mL (ref 0–48)
Epinephrine: 22 pg/mL (ref 0–62)
Norepinephrine: 413 pg/mL (ref 0–874)

## 2021-12-06 LAB — TSH: TSH: 0.9 u[IU]/mL (ref 0.450–4.500)

## 2021-12-06 LAB — CORTISOL: Cortisol: 9.4 ug/dL

## 2021-12-10 DIAGNOSIS — E1140 Type 2 diabetes mellitus with diabetic neuropathy, unspecified: Secondary | ICD-10-CM | POA: Diagnosis not present

## 2021-12-10 DIAGNOSIS — I10 Essential (primary) hypertension: Secondary | ICD-10-CM | POA: Diagnosis not present

## 2021-12-10 DIAGNOSIS — F320 Major depressive disorder, single episode, mild: Secondary | ICD-10-CM | POA: Diagnosis not present

## 2021-12-15 ENCOUNTER — Encounter: Payer: Self-pay | Admitting: Adult Health

## 2021-12-15 ENCOUNTER — Other Ambulatory Visit: Payer: Self-pay

## 2021-12-15 ENCOUNTER — Ambulatory Visit: Payer: Medicaid Other | Admitting: Adult Health

## 2021-12-15 VITALS — BP 134/85 | HR 78 | Ht 66.5 in | Wt 294.0 lb

## 2021-12-15 DIAGNOSIS — Z975 Presence of (intrauterine) contraceptive device: Secondary | ICD-10-CM

## 2021-12-15 DIAGNOSIS — B009 Herpesviral infection, unspecified: Secondary | ICD-10-CM

## 2021-12-15 DIAGNOSIS — B3731 Acute candidiasis of vulva and vagina: Secondary | ICD-10-CM | POA: Insufficient documentation

## 2021-12-15 DIAGNOSIS — N898 Other specified noninflammatory disorders of vagina: Secondary | ICD-10-CM | POA: Diagnosis not present

## 2021-12-15 LAB — POCT WET PREP (WET MOUNT): WBC, Wet Prep HPF POC: POSITIVE

## 2021-12-15 MED ORDER — FLUCONAZOLE 150 MG PO TABS: ORAL_TABLET | ORAL | 2 refills | Status: DC

## 2021-12-15 MED ORDER — VALACYCLOVIR HCL 1 G PO TABS: 1000.0000 mg | ORAL_TABLET | Freq: Every day | ORAL | 3 refills | Status: DC

## 2021-12-15 NOTE — Progress Notes (Signed)
°  Subjective:     Patient ID: [name], female   DOB: 24-Dec-1985, 36 y.o.   MRN: [id number]  HPI [name] is a 36 year old black female,single, G1P1 in complaining of vaginal discharge, and itching and has a bump on vulva. And dark spots right side near bra line.  Lab Results  Component Value Date   DIAGPAP  03/12/2021    - Negative for intraepithelial lesion or malignancy (NILM)   HPV NOT DETECTED 03/08/2017   HPVHIGH Positive (A) 03/12/2021   PCP is Dr Selena Batten  Review of Systems +vaginal discharge +vaginal itching +bump on vulva +dark spots on right side +cramps, no period with IUD  Reviewed past medical,surgical, social and family history. Reviewed medications and allergies.     Objective:   Physical Exam BP 134/85 (BP Location: Right Arm, Patient Position: Sitting, Cuff Size: Large)    Pulse 78    Ht 5' 6.5" (1.689 m)    Wt 294 lb (133.4 kg)    BMI 46.74 kg/m     Skin warm and dry. Has dark spots on right side, like scarring from scratching. Pelvic: external genitalia is normal in appearance, has vesicle like area right labia near clitoris, HSV culture obtained, vagina: white discharge without odor,urethra has no lesions or masses noted, cervix:smooth and bulbous,+IUD strings at os, uterus: normal size, shape and contour, non tender, no masses felt, adnexa: no masses or tenderness noted. Bladder is non tender and no masses felt. Wet prep, +yeast and +WBC Examination chaperoned by Faith Rogue LPN   [place] - 12/15/21 1519       Pregnancy Intention Screening   Does the patient want to become pregnant in the next year? No    Does the patient's partner want to become pregnant in the next year? No    Would the patient like to discuss contraceptive options today? No      Contraception Wrap Up   Current Method IUD or IUS    End Method IUD or IUS    Contraception Counseling Provided No             Assessment:     1. Vaginal discharge  2. Vaginal  itching Will rx diflucan for yeast   3. IUD (intrauterine device) in place  4. Herpes HSV culture sent Has history of herpes  Will rx refill valtrex, but take 1 bid x 10 days   5. Vaginal yeast infection Will rx diflucan for yeast infection  Meds ordered this encounter  Medications   valACYclovir (VALTREX) 1000 MG tablet    Sig: Take 1 tablet (1,000 mg total) by mouth daily.    Dispense:  90 tablet    Refill:  3    Order Specific Question:   Supervising Provider    Answer:   Duane Lope H [[id number]]   fluconazole (DIFLUCAN) 150 MG tablet    Sig: Take 1 now and 1 in 3 days    Dispense:  2 tablet    Refill:  2    Order Specific Question:   Supervising Provider    Answer:   Lazaro Arms [[id number]]       Plan:     Return in May for pap and physical

## 2021-12-18 LAB — HERPES SIMPLEX VIRUS CULTURE

## 2021-12-27 ENCOUNTER — Ambulatory Visit: Payer: Medicaid Other

## 2022-01-04 DIAGNOSIS — G4733 Obstructive sleep apnea (adult) (pediatric): Secondary | ICD-10-CM | POA: Diagnosis not present

## 2022-01-10 DIAGNOSIS — R011 Cardiac murmur, unspecified: Secondary | ICD-10-CM | POA: Diagnosis not present

## 2022-01-10 DIAGNOSIS — R809 Proteinuria, unspecified: Secondary | ICD-10-CM | POA: Diagnosis not present

## 2022-01-10 DIAGNOSIS — Z0000 Encounter for general adult medical examination without abnormal findings: Secondary | ICD-10-CM | POA: Diagnosis not present

## 2022-01-10 DIAGNOSIS — E119 Type 2 diabetes mellitus without complications: Secondary | ICD-10-CM | POA: Diagnosis not present

## 2022-01-10 DIAGNOSIS — F329 Major depressive disorder, single episode, unspecified: Secondary | ICD-10-CM | POA: Diagnosis not present

## 2022-01-10 DIAGNOSIS — E559 Vitamin D deficiency, unspecified: Secondary | ICD-10-CM | POA: Diagnosis not present

## 2022-01-10 DIAGNOSIS — I10 Essential (primary) hypertension: Secondary | ICD-10-CM | POA: Diagnosis not present

## 2022-01-25 ENCOUNTER — Ambulatory Visit: Payer: Medicaid Other

## 2022-01-25 NOTE — Progress Notes (Deleted)
? ? ? ?01/25/2022 ?PAISLEE SZATKOWSKI ?Mar 12, 1986 ?[id number] ? ? ?HPI:  [name] is a 36 y.o. female patient of Dr Duke Salvia, who presents today for advanced hypertension clinic follow up.  At the time of her visit with Dr. Duke Salvia in January her pressure was 168/118.  She was referred by Dr. Diona Browner given that she was first diagnosed at the age of 13.   She was diagnosed with PCOS at that time and started on lisinopril and hctz.  She also recalls taking amlodipine at one time.   ? ?A full secondary hypertension evaluation was done, and all blood work (catecholamines, metanephrines, aldosterone/renin and coritsol) came back WNL.  An abdominal CT back in 2012 was negative for RAS/fibromuscular dysplasia, and it showed no signs of adrenal adenomas.  When she saw Dr Duke Salvia it was noted that she was using compression stocking 2/2 lower extremity edema, so the amlodipine was discontinued.  She was interested in remote patient monitoring and was randomized to the [employer] group 1.   ? ?Past Medical History: ?DM2 A1c not on file: on Jardiance 10 mg, semaglutide 3 mg po  ?OSA   ?obesity BMI  ?migraine On Aimovig, Ubrelvy, sumatriptan  ? ?  ? ?Blood Pressure Goal:  130/80 ? ?Current Medications: spironolactone 25 mg qd, guanfacine 1 mg qd, hydralazine 100 mg tid, lisinopril hctz 20/25 mg qd ? ?Family Hx: ? ?Social Hx: quit smoking in 2022 ? ?Diet:  ? ?Exercise:  ? ?Home BP readings:  ? ?Intolerances:  ? ?Labs:  ? ? ?Wt Readings from Last 3 Encounters:  ?12/15/21 294 lb (133.4 kg)  ?11/25/21 (!) 300 lb 4.8 oz (136.2 kg)  ?08/16/21 (!) 302 lb 12.8 oz (137.3 kg)  ? ?BP Readings from Last 3 Encounters:  ?12/15/21 134/85  ?11/25/21 (!) 168/118  ?08/16/21 140/88  ? ?Pulse Readings from Last 3 Encounters:  ?12/15/21 78  ?11/25/21 71  ?08/16/21 82  ? ? ?Current Outpatient Medications  ?Medication Sig Dispense Refill  ? acetaminophen (TYLENOL) 500 MG tablet Take 1,000 mg by mouth as needed.    ? AIMOVIG 140 MG/ML SOAJ  SMARTSIG:140 Milligram(s) SUB-Q    ? amLODipine (NORVASC) 5 MG tablet Take 1 tablet by mouth daily.    ? baclofen (LIORESAL) 10 MG tablet Take 10 mg by mouth 3 (three) times daily as needed.    ? buPROPion (WELLBUTRIN XL) 150 MG 24 hr tablet Take by mouth.    ? buPROPion (WELLBUTRIN XL) 300 MG 24 hr tablet Take by mouth.    ? Cholecalciferol 5000 units capsule Take 1 capsule (5,000 Units total) by mouth daily.    ? cyclobenzaprine (FLEXERIL) 10 MG tablet TAKE ONE TABLET BY MOUTH EVERY 6 HOURS AS NEEDED FOR MUSCLE SPASM 30 tablet 0  ? DULoxetine (CYMBALTA) 60 MG capsule Take 60 mg by mouth daily.    ? fluconazole (DIFLUCAN) 150 MG tablet Take 1 now and 1 in 3 days 2 tablet 2  ? furosemide (LASIX) 20 MG tablet Take 20 mg by mouth as needed.    ? gabapentin (NEURONTIN) 100 MG capsule Take 100 mg by mouth daily.    ? gabapentin (NEURONTIN) 300 MG capsule Take 1 capsule (300 mg total) by mouth 3 (three) times daily. 90 capsule 0  ? guanFACINE (TENEX) 1 MG tablet Take 1 mg by mouth at bedtime.    ? hydrALAZINE (APRESOLINE) 100 MG tablet Take 1 tablet by mouth in the morning, at noon, and at bedtime.    ?  hydrOXYzine (ATARAX/VISTARIL) 50 MG tablet Take 50 mg by mouth 3 (three) times daily.    ? JARDIANCE 10 MG TABS tablet Take 10 mg by mouth daily.    ? levonorgestrel (MIRENA) 20 MCG/24HR IUD 1 each by Intrauterine route once.    ? lisinopril-hydrochlorothiazide (ZESTORETIC) 20-25 MG tablet Take 1 tablet by mouth daily.    ? metroNIDAZOLE (METROGEL) 0.75 % vaginal gel INSERT 1 APPLICATORFUL VAGINALLY AT BEDTIME FOR  5  NIGHTS.  NO  ALCOHOL  OR  SEX  WHILE  USING. (Patient not taking: Reported on 12/15/2021) 70 g 0  ? montelukast (SINGULAIR) 10 MG tablet Take 1 tablet by mouth daily as needed.    ? mupirocin ointment (BACTROBAN) 2 % Apply 1 application topically 3 (three) times daily. (Patient not taking: Reported on 12/15/2021) 30 g 0  ? nystatin ointment (MYCOSTATIN) Apply 1 application topically 2 (two) times daily. (Patient  not taking: Reported on 12/15/2021) 30 g 2  ? nystatin-triamcinolone ointment (MYCOLOG) Apply 1 application topically 2 (two) times daily. To affected area. (Patient not taking: Reported on 12/15/2021) 30 g prn  ? pantoprazole (PROTONIX) 20 MG tablet Take 20 mg by mouth daily.    ? PROAIR HFA 108 (90 Base) MCG/ACT inhaler SMARTSIG:2 Puff(s) By Mouth Every 4-6 Hours PRN    ? promethazine (PHENERGAN) 25 MG tablet Take 1 tablet (25 mg total) by mouth every 6 (six) hours as needed. 10 tablet 0  ? Semaglutide 3 MG TABS Take by mouth. (Patient not taking: Reported on 12/15/2021)    ? spironolactone (ALDACTONE) 25 MG tablet Take 1 tablet (25 mg total) by mouth daily. 90 tablet 3  ? SUMAtriptan (IMITREX) 100 MG tablet Take 1 tablet by mouth as needed.    ? traZODone (DESYREL) 50 MG tablet TAKE 1 TABLET BY MOUTH AS NEEDED AT BEDTIME    ? triamcinolone (KENALOG) 0.025 % ointment Apply 1 application topically 2 (two) times daily. (Patient not taking: Reported on 12/15/2021) 30 g 2  ? UBRELVY 100 MG TABS Take by mouth.    ? valACYclovir (VALTREX) 1000 MG tablet Take 1 tablet (1,000 mg total) by mouth daily. 90 tablet 3  ? Vitamin D, Ergocalciferol, (DRISDOL) 1.25 MG (50000 UT) CAPS capsule Take 50,000 Units by mouth every 7 (seven) days.    ? ?No current facility-administered medications for this visit.  ? ? ?No Known Allergies ? ?Past Medical History:  ?Diagnosis Date  ? Anxiety   ? Bronchitis   ? Depression   ? Gallstone   ? Headache(784.0)   ? 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 History of kidney stones   ? Hypertension   ? IUD (intrauterine device) in place   ? Migraines   ? Neuropathy   ? Obese   ? Papanicolaou smear of cervix with positive high risk human papilloma virus (HPV) test 03/22/2021  ? 03/22/21 repeat pap in 1 year per ASCCP guideline, 5 year risk for CIN 3+ is 2.25 %  ? Polycystic ovarian syndrome   ? PTSD (post-traumatic stress disorder)   ? 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 Sleep apnea   ? Trichomonas  contact, treated   ? Type 2 diabetes mellitus (HCC)   ? Vitamin D deficiency   ? Yeast infection   ? ? ?There were no vitals taken for this visit. ? ?No problem-specific Assessment & Plan notes found for this encounter. ? ? ?Phillips HayKristin Ajia Chadderdon PharmD CPP [facility] ?[facility] ?  [address] ?[place], [place] [address] ?[phone] ?

## 2022-02-15 DIAGNOSIS — I10 Essential (primary) hypertension: Secondary | ICD-10-CM | POA: Diagnosis not present

## 2022-02-15 DIAGNOSIS — E781 Pure hyperglyceridemia: Secondary | ICD-10-CM | POA: Diagnosis not present

## 2022-02-15 DIAGNOSIS — E1140 Type 2 diabetes mellitus with diabetic neuropathy, unspecified: Secondary | ICD-10-CM | POA: Diagnosis not present

## 2022-02-15 DIAGNOSIS — F17210 Nicotine dependence, cigarettes, uncomplicated: Secondary | ICD-10-CM | POA: Diagnosis not present

## 2022-02-18 ENCOUNTER — Ambulatory Visit: Payer: Medicaid Other | Admitting: Pharmacist

## 2022-02-18 VITALS — BP 149/106 | HR 73 | Wt 287.0 lb

## 2022-02-18 DIAGNOSIS — I10 Essential (primary) hypertension: Secondary | ICD-10-CM | POA: Diagnosis not present

## 2022-02-18 DIAGNOSIS — G4733 Obstructive sleep apnea (adult) (pediatric): Secondary | ICD-10-CM | POA: Diagnosis not present

## 2022-02-18 DIAGNOSIS — Z5181 Encounter for therapeutic drug level monitoring: Secondary | ICD-10-CM | POA: Diagnosis not present

## 2022-02-18 LAB — BASIC METABOLIC PANEL
BUN/Creatinine Ratio: 10 (ref 9–23)
BUN: 8 mg/dL (ref 6–20)
CO2: 21 mmol/L (ref 20–29)
Calcium: 9.1 mg/dL (ref 8.7–10.2)
Chloride: 105 mmol/L (ref 96–106)
Creatinine, Ser: 0.77 mg/dL (ref 0.57–1.00)
Glucose: 105 mg/dL — ABNORMAL HIGH (ref 70–99)
Potassium: 3.9 mmol/L (ref 3.5–5.2)
Sodium: 139 mmol/L (ref 134–144)
eGFR: 103 mL/min/{1.73_m2} (ref >59)

## 2022-02-18 NOTE — Patient Instructions (Addendum)
It was nice meeting you today ? ?We would like your blood pressure to be less than 130/80 ? ?Please restart your lisinopril/hydrochlorothiazide at this point ? ?Continue: ?Spironolactone 25mg  daily ?Amlodipine 5mg  daily ?Hydralazine 100mg  three times a day ?Furosemide 20mg  daily as needed ? ?Please continue to check your blood pressure at home ? ?We will check your lab work today to make sure you are tolerating the spironolactone ? ?Please call with any questions ? ? , PharmD, BCACP, CDCES, CPP ?[address] ?[place], , [place] ?Phone: [phone], Fax: [phone]  ?

## 2022-02-18 NOTE — Progress Notes (Signed)
Patient ID: [name]                 DOB: 1986/02/05                      MRN: [id number] ? ? ? ? ?HPI: ?[name] is a 36 y.o. female referred by Dr. Duke Salvia to HTN clinic. PMH is significant for PCOS, HTN, DM, depression, and OSA.  She has had HTN since 36 years old. ? ?Seen by Dr Duke Salvia on 11/26/21 for first visit with [facility] clinic.  Patient randomized to group 2 for study. At visit, patient was started on spironolactone and hydralazine was increased. ? ?Patient presents today in good spirits.  Has been checking BP at home which connects to app on her phone.   ? ?Recent readings: ?138/92 ?183/129 ?142/93 ?172/116 ?169/114 ?172/116 ?149/103 ?142/93 ?138/92 ?173/113 ? ?PCP has recently started her on Rybelsus which has helped her lose weight.  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  Tries to walk around her complex on breaks but for the majority of time her job is sedentary.  Has been going to the gym 3 times a week with her son. Does treadmill, stair machine, and weights.  Each visit to the gym last about 2 and a half hours.  Uses a CPAP at night. ? ?Has tried to reduce sodium in her diet. Now uses Mrs Sharilyn Sites and salt free seasonings.  Drinks 1 cup of coffee daily. Uses IUD for contraceptions. ? ?Was instructed to hold her lisinopril/HCTZ for aldosterone/renin lab work and then restart.  Unfortunately she misunderstood the directions and never restarted the lisinopril/HCTZ.  Has not taken since January.  Was supposed to have BMP drawn as well since starting spironolactone but for some reason this was not drawn at the time. ? ?Current HTN meds:  ?Spironolactone 25mg  daily ?Amlodipine 5mg  ?Hydralazine 100mg  daily TID ?Lisinopril/HCTZ 20/25mg  daily not taking ?Furosemide 20mg  daily PRN ? ?BP goal: <130/80 ? ?Wt Readings from Last 3 Encounters:  ?12/15/21 294 lb (133.4  kg)  ?11/25/21 (!) 300 lb 4.8 oz (136.2 kg)  ?08/16/21 (!) 302 lb 12.8 oz (137.3 kg)  ? ?BP Readings from Last 3 Encounters:  ?12/15/21 134/85  ?11/25/21 (!) 168/118  ?08/16/21 140/88  ? ?Pulse Readings from Last 3 Encounters:  ?12/15/21 78  ?11/25/21 71  ?08/16/21 82  ? ? ?Renal function: ?CrCl cannot be calculated (Patient's most recent lab result is older than the maximum 21 days allowed.). ? ?Past Medical History:  ?Diagnosis Date  ? Anxiety   ? Bronchitis   ? Depression   ? Gallstone   ? Headache(784.0)   ? 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 History of kidney stones   ? Hypertension   ? IUD (intrauterine device) in place   ? Migraines   ? Neuropathy   ? Obese   ? Papanicolaou smear of cervix with positive high risk human papilloma virus (HPV) test 03/22/2021  ? 03/22/21 repeat pap in 1 year per ASCCP guideline, 5 year risk for CIN 3+ is 2.25 %  ? Polycystic ovarian syndrome   ? PTSD (post-traumatic stress disorder)   ? 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 Sleep apnea   ? Trichomonas contact, treated   ? Type 2 diabetes mellitus (HCC)   ? Vitamin D  deficiency   ? Yeast infection   ? ? ?Current Outpatient Medications on File Prior to Visit  ?Medication Sig Dispense Refill  ? Ubrogepant 100 MG TABS Take by mouth. Take 100 mg by mouth as needed. May repeat in 2 hours.  Max 200 mg/24 hours    ? acetaminophen (TYLENOL) 500 MG tablet Take 1,000 mg by mouth as needed.    ? AIMOVIG 140 MG/ML SOAJ SMARTSIG:140 Milligram(s) SUB-Q    ? amLODipine (NORVASC) 5 MG tablet Take 1 tablet by mouth daily.    ? baclofen (LIORESAL) 10 MG tablet Take 10 mg by mouth 3 (three) times daily as needed.    ? buPROPion (WELLBUTRIN XL) 150 MG 24 hr tablet Take by mouth.    ? buPROPion (WELLBUTRIN XL) 300 MG 24 hr tablet Take by mouth.    ? Cholecalciferol 5000 units capsule Take 1 capsule (5,000 Units total) by mouth daily.    ? cyclobenzaprine (FLEXERIL) 10 MG tablet TAKE ONE TABLET BY MOUTH EVERY 6 HOURS AS NEEDED FOR MUSCLE  SPASM 30 tablet 0  ? DULoxetine (CYMBALTA) 60 MG capsule Take 60 mg by mouth daily.    ? fluconazole (DIFLUCAN) 150 MG tablet Take 1 now and 1 in 3 days 2 tablet 2  ? furosemide (LASIX) 20 MG tablet Take 20 mg by mouth as needed.    ? gabapentin (NEURONTIN) 100 MG capsule Take 100 mg by mouth daily.    ? gabapentin (NEURONTIN) 300 MG capsule Take 1 capsule (300 mg total) by mouth 3 (three) times daily. 90 capsule 0  ? guanFACINE (TENEX) 1 MG tablet Take 1 mg by mouth at bedtime.    ? hydrALAZINE (APRESOLINE) 100 MG tablet Take 1 tablet by mouth in the morning, at noon, and at bedtime.    ? hydrOXYzine (ATARAX/VISTARIL) 50 MG tablet Take 50 mg by mouth 3 (three) times daily.    ? JARDIANCE 10 MG TABS tablet Take 10 mg by mouth daily.    ? levonorgestrel (MIRENA) 20 MCG/24HR IUD 1 each by Intrauterine route once.    ? lisinopril-hydrochlorothiazide (ZESTORETIC) 20-25 MG tablet Take 1 tablet by mouth daily.    ? metroNIDAZOLE (METROGEL) 0.75 % vaginal gel INSERT 1 APPLICATORFUL VAGINALLY AT BEDTIME FOR  5  NIGHTS.  NO  ALCOHOL  OR  SEX  WHILE  USING. (Patient not taking: Reported on 12/15/2021) 70 g 0  ? montelukast (SINGULAIR) 10 MG tablet Take 1 tablet by mouth daily as needed.    ? mupirocin ointment (BACTROBAN) 2 % Apply 1 application topically 3 (three) times daily. (Patient not taking: Reported on 12/15/2021) 30 g 0  ? nystatin ointment (MYCOSTATIN) Apply 1 application topically 2 (two) times daily. (Patient not taking: Reported on 12/15/2021) 30 g 2  ? nystatin-triamcinolone ointment (MYCOLOG) Apply 1 application topically 2 (two) times daily. To affected area. (Patient not taking: Reported on 12/15/2021) 30 g prn  ? pantoprazole (PROTONIX) 20 MG tablet Take 20 mg by mouth daily.    ? PROAIR HFA 108 (90 Base) MCG/ACT inhaler SMARTSIG:2 Puff(s) By Mouth Every 4-6 Hours PRN    ? promethazine (PHENERGAN) 25 MG tablet Take 1 tablet (25 mg total) by mouth every 6 (six) hours as needed. 10 tablet 0  ? Semaglutide 3 MG TABS  Take by mouth. (Patient not taking: Reported on 12/15/2021)    ? spironolactone (ALDACTONE) 25 MG tablet Take 1 tablet (25 mg total) by mouth daily. 90 tablet 3  ? SUMAtriptan (IMITREX) 100 MG  tablet Take 1 tablet by mouth as needed.    ? traZODone (DESYREL) 50 MG tablet TAKE 1 TABLET BY MOUTH AS NEEDED AT BEDTIME    ? triamcinolone (KENALOG) 0.025 % ointment Apply 1 application topically 2 (two) times daily. (Patient not taking: Reported on 12/15/2021) 30 g 2  ? valACYclovir (VALTREX) 1000 MG tablet Take 1 tablet (1,000 mg total) by mouth daily. 90 tablet 3  ? Vitamin D, Ergocalciferol, (DRISDOL) 1.25 MG (50000 UT) CAPS capsule Take 50,000 Units by mouth every 7 (seven) days.    ? ?No current facility-administered medications on file prior to visit.  ? ? ?No Known Allergies ? ? ?Assessment/Plan: ? ?1. Hypertension -  Patient BP in room today 149/106 which is above goal of <130/80. Patient concerned her diastolic readings are usually elevated. Unfortunately did not realize she was supposed to restart Lisinopril/HCTZ.  Will restart at this time. ? ?Will update BMP today to check renal function and electrolytes since starting spironolactone ? ?Encouraged patient to continue positive lifestyle changes such as exercise and diet changes. Encouraged continued sodium reduction and continuing at least 150 minutes a week of exercise. ? ?Recheck in 1 month for HTN visit #2 ? ?[place], PharmD, BCACP, CDCES, CPP ?[address], [address] ?[place], [place], [address] ?Phone: [phone], Fax: [phone]  ?

## 2022-02-20 ENCOUNTER — Encounter: Payer: Self-pay | Admitting: Pharmacist

## 2022-03-06 DIAGNOSIS — G4733 Obstructive sleep apnea (adult) (pediatric): Secondary | ICD-10-CM | POA: Diagnosis not present

## 2022-03-11 ENCOUNTER — Telehealth: Payer: Self-pay

## 2022-03-11 NOTE — Telephone Encounter (Signed)
[facility] pt reference PREP referral. Request call back to discuss next PREP class at [facility] starting on 03/22/22 ? ?

## 2022-03-14 ENCOUNTER — Other Ambulatory Visit (HOSPITAL_COMMUNITY)
Admission: RE | Admit: 2022-03-14 | Discharge: 2022-03-14 | Disposition: A | Discharge status: Home / Self Care | Payer: Medicaid Other | Source: Ambulatory Visit | Attending: Adult Health | Admitting: Adult Health

## 2022-03-14 ENCOUNTER — Ambulatory Visit (INDEPENDENT_AMBULATORY_CARE_PROVIDER_SITE_OTHER): Payer: Medicaid Other | Admitting: Adult Health

## 2022-03-14 ENCOUNTER — Telehealth: Payer: Self-pay

## 2022-03-14 ENCOUNTER — Encounter: Payer: Self-pay | Admitting: Adult Health

## 2022-03-14 VITALS — BP 140/99 | HR 69 | Ht 68.5 in | Wt 282.0 lb

## 2022-03-14 DIAGNOSIS — Z975 Presence of (intrauterine) contraceptive device: Secondary | ICD-10-CM | POA: Diagnosis not present

## 2022-03-14 DIAGNOSIS — Z113 Encounter for screening for infections with a predominantly sexual mode of transmission: Secondary | ICD-10-CM

## 2022-03-14 DIAGNOSIS — F32A Depression, unspecified: Secondary | ICD-10-CM | POA: Diagnosis not present

## 2022-03-14 DIAGNOSIS — Z01419 Encounter for gynecological examination (general) (routine) without abnormal findings: Secondary | ICD-10-CM | POA: Insufficient documentation

## 2022-03-14 DIAGNOSIS — N907 Vulvar cyst: Secondary | ICD-10-CM

## 2022-03-14 DIAGNOSIS — F419 Anxiety disorder, unspecified: Secondary | ICD-10-CM | POA: Diagnosis not present

## 2022-03-14 DIAGNOSIS — I10 Essential (primary) hypertension: Secondary | ICD-10-CM | POA: Diagnosis not present

## 2022-03-14 DIAGNOSIS — E559 Vitamin D deficiency, unspecified: Secondary | ICD-10-CM | POA: Diagnosis not present

## 2022-03-14 DIAGNOSIS — Z13220 Encounter for screening for lipoid disorders: Secondary | ICD-10-CM | POA: Diagnosis not present

## 2022-03-14 DIAGNOSIS — E119 Type 2 diabetes mellitus without complications: Secondary | ICD-10-CM | POA: Diagnosis not present

## 2022-03-14 DIAGNOSIS — Z1329 Encounter for screening for other suspected endocrine disorder: Secondary | ICD-10-CM | POA: Diagnosis not present

## 2022-03-14 MED ORDER — FLUCONAZOLE 150 MG PO TABS: ORAL_TABLET | ORAL | 2 refills | Status: DC

## 2022-03-14 MED ORDER — NYSTATIN 100000 UNIT/GM EX CREA: 1.0000 "application " | TOPICAL_CREAM | Freq: Two times a day (BID) | CUTANEOUS | 3 refills | Status: DC

## 2022-03-14 MED ORDER — VALACYCLOVIR HCL 1 G PO TABS: 1000.0000 mg | ORAL_TABLET | Freq: Every day | ORAL | 3 refills | Status: DC

## 2022-03-14 MED ORDER — TRIAMCINOLONE ACETONIDE 0.025 % EX CREA: 1.0000 "application " | TOPICAL_CREAM | Freq: Two times a day (BID) | CUTANEOUS | 3 refills | Status: DC

## 2022-03-14 NOTE — Progress Notes (Signed)
Patient ID: [name], female   DOB: Feb 16, 1986, 36 y.o.   MRN: [id number] ?History of Present Illness: ?[name] is a 36 year old black female,single, G1P1001, in for a well woman gyn exam and pap. Her pap last year +HPV other. ?PCP is [facility] ? ? ? ?Current Medications, Allergies, Past Medical History, Past Surgical History, Family History and Social History were reviewed in [employer] record.   ? ? ?Review of Systems: ? ?Patient denies any headaches, hearing loss, fatigue, blurred vision, shortness of breath, chest pain, abdominal pain, problems with bowel movements, urination, or intercourse. No joint pain or mood swings.  ?+moody, requests refills on valtrex,diflucan,nystatin and triamcinolone. ? ?Physical Exam:BP (!) 140/99 (BP Location: Left Arm, Patient Position: Sitting, Cuff Size: Large)   Pulse 69   Ht 5' 8.5" (1.74 m)   Wt 282 lb (127.9 kg)   BMI 42.25 kg/m?   ?General:  Well developed, well nourished, no acute distress ?Skin:  Warm and dry ?Neck:  Midline trachea, normal thyroid, good ROM, no lymphadenopathy ?Lungs; Clear to auscultation bilaterally ?Breast:  No dominant palpable mass, retraction, or nipple discharge ?Cardiovascular: Regular rate and rhythm ?Abdomen:  Soft, non tender, no hepatosplenomegaly ?Pelvic:  External genitalia is normal in appearance, epidermal cyst left labia.  The vagina is normal in appearance. Urethra has no lesions or masses. The cervix is bulbous. +IUD strings at os, pap performed with GC/CHL and HR  HPV genotyping. Uterus is felt to be normal size, shape, and contour.  No adnexal masses or tenderness noted.Bladder is non tender, no masses felt. ?Extremities/musculoskeletal:  No swelling or varicosities noted, no clubbing or cyanosis ?Psych:  No mood changes, alert and cooperative,seems happy ?AA is 4 ?Fall risk is low ? ?  03/14/2022  ?  8:47 AM 03/12/2021  ? 11:20 AM 03/08/2017  ?  2:19 PM  ?Depression screen PHQ 2/9  ?Decreased  Interest 2 2 1   ?Down, Depressed, Hopeless 2 3 1   ?PHQ - 2 Score 4 5 2   ?Altered sleeping 2 3 2   ?Tired, decreased energy 2 3 2   ?Change in appetite 2 3 2   ?Feeling bad or failure about yourself  2 3 2   ?Trouble concentrating 2 3 1   ?Moving slowly or fidgety/restless 1 3 1   ?Suicidal thoughts 0 1 0  ?PHQ-9 Score 15 24 12   ?Difficult doing work/chores   Somewhat difficult  ? She is on meds and sees therapist in [place]  ? ?  03/14/2022  ?  8:48 AM 03/12/2021  ? 11:25 AM  ?GAD 7 : Generalized Anxiety Score  ?Nervous, Anxious, on Edge 2 3  ?Control/stop worrying 2 3  ?Worry too much - different things 2 3  ?Trouble relaxing 2 3  ?Restless 2 3  ?Easily annoyed or irritable 2 3  ?Afraid - awful might happen 2 3  ?Total GAD 7 Score 14 21  ? ? [facility] - 03/14/22 0847   ? ?  ? Pregnancy Intention Screening  ? Does the patient want to become pregnant in the next year? Unsure   ? Does the patient's partner want to become pregnant in the next year? Unsure   ? Would the patient like to discuss contraceptive options today? No   ?  ? Contraception Wrap Up  ? Current Method IUD or IUS   ? End Method IUD or IUS   ? Contraception Counseling Provided No   ? ?  ?  ? ?  ?  ?  Examination chaperoned by Faith Rogue LPN ? ?Impression and Plan: ?1. Encounter for gynecological examination with Papanicolaou smear of cervix ?Pap sent ?Physical in 1 year ?Pap in 3 if normal  ? ?Meds ordered this encounter  ?Medications  ? valACYclovir (VALTREX) 1000 MG tablet  ?  Sig: Take 1 tablet (1,000 mg total) by mouth daily.  ?  Dispense:  90 tablet  ?  Refill:  3  ?  Order Specific Question:   Supervising Provider  ?  Answer:   Duane Lope H [[id number]]  ? fluconazole (DIFLUCAN) 150 MG tablet  ?  Sig: Take 1 now and 1 in 3 days  ?  Dispense:  2 tablet  ?  Refill:  2  ?  Order Specific Question:   Supervising Provider  ?  Answer:   Duane Lope H [[id number]]  ? nystatin cream (MYCOSTATIN)  ?  Sig: Apply 1 application. topically 2 (two) times daily.   ?  Dispense:  30 g  ?  Refill:  3  ?  Order Specific Question:   Supervising Provider  ?  Answer:   Duane Lope H [[id number]]  ? triamcinolone (KENALOG) 0.025 % cream  ?  Sig: Apply 1 application. topically 2 (two) times daily.  ?  Dispense:  30 g  ?  Refill:  3  ?  Order Specific Question:   Supervising Provider  ?  Answer:   Duane Lope H [[id number]]  ? Follow up with PCP on BP  ? ?2. Screen for STD (sexually transmitted disease) ?GC/CHL on pap ? ?3. IUD (intrauterine device) in place ?Placed 11/30/17,mirena ? ?4. Epidermal cyst of vulva ? ?5. Anxiety and depression ?Follow up with therapist in [place] ? ? ? ? ?  ?  ?

## 2022-03-14 NOTE — Telephone Encounter (Signed)
Returned pt's call reference PREP class starting in [place] on 5/16 T/TH 2p-315p ?Confirmed interest and availability ?Can do intake on 5/11 at 2pm ?Will meet her in lobby of [place]  ?

## 2022-03-16 LAB — CYTOLOGY - PAP
Chlamydia: NEGATIVE
Comment: NEGATIVE
Comment: NEGATIVE
Comment: NORMAL
Diagnosis: UNDETERMINED — AB
High risk HPV: NEGATIVE
Neisseria Gonorrhea: NEGATIVE

## 2022-03-17 ENCOUNTER — Encounter: Payer: Self-pay | Admitting: Adult Health

## 2022-03-17 DIAGNOSIS — R87610 Atypical squamous cells of undetermined significance on cytologic smear of cervix (ASC-US): Secondary | ICD-10-CM | POA: Insufficient documentation

## 2022-03-17 HISTORY — DX: Atypical squamous cells of undetermined significance on cytologic smear of cervix (ASC-US): R87.610

## 2022-03-17 NOTE — Progress Notes (Signed)
[facility] PREP Evaluation ? ?Patient Details  ?Name: [name] ?MRN: [id number] ?Date of Birth: 1986-02-14 ?Age: 36 y.o. ?PCP: Pearson Grippe, MD ? ?Vitals:  ? 03/17/22 1400  ?BP: 130/80  ?Pulse: 71  ?SpO2: 97%  ?Weight: 282 lb 3.2 oz (128 kg)  ? ? ? [facility] Eval - 03/17/22 1600   ? ?  ? [facility]  ? [facility]   ?  ? Referral   ? Referring Provider Duke Salvia   ? Reason for referral Hypertension;Obesitity/Overweight   ? Program Start Date 03/22/22   T/TH 2pm-315pm x 12 wks  ?  ? Measurement  ? Waist Circumference 54 inches   ? Hip Circumference 54 inches   ? Body fat 45 percent   ?  ? Information for Trainer  ? Goals Lose 20 lbs, get more stamina and endurance   ? Current Exercise Walks every day for 30 min 5-7 days per week, works out with son at gym   ? Orthopedic Concerns Left knee-fell on concrete hx   ? Pertinent Medical History heart murmur, HTN, DM2 A1C 6.7, OSA, migraines PCOS   ? Current Barriers none   ? Restrictions/Precautions --   none  ? Medications that affect exercise Medication causing dizziness/drowsiness;Asthma inhaler   ?  ? Timed Up and Go (TUGS)  ? Timed Up and Go Low risk <9 seconds   ?  ? Mobility and Daily Activities  ? I find it easy to walk up or down two or more flights of stairs. 2   ? I have no trouble taking out the trash. 4   ? I do housework such as vacuuming and dusting on my own without difficulty. 4   ? I can easily lift a gallon of milk (8lbs). 4   ? I can easily walk a mile. 1   ? I have no trouble reaching into high cupboards or reaching down to pick up something from the floor. 4   ? I do not have trouble doing out-door work such as Loss adjuster, chartered, raking leaves, or gardening. 2   ?  ? Mobility and Daily Activities  ? I feel younger than my age. 1   ? I feel independent. 2   ? I feel energetic. 2   ? I live an active life.  4   ? I feel strong. 1   ? I feel healthy. 2   ? I feel active as other people my age. 1   ?  ? How fit and strong  are you.  ? Fit and Strong Total Score 34   ? ?  ?  ? ?  ? ?Past Medical History:  ?Diagnosis Date  ? Anxiety   ? ASCUS of cervix with negative high risk HPV 03/17/2022  ? 03/17/22 repeat pap in 1 year per ASCCP guidelines 5 year  CIN 3+ risk is 2.6%  ? Bronchitis   ? Depression   ? Gallstone   ? Headache(784.0)   ? 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 History of kidney stones   ? Hypertension   ? IUD (intrauterine device) in place   ? Migraines   ? Neuropathy   ? Obese   ? Papanicolaou smear of cervix with positive high risk human papilloma virus (HPV) test 03/22/2021  ? 03/22/21 repeat pap in 1 year per ASCCP guideline, 5 year risk for CIN 3+ is 2.25 %  ? Polycystic  ovarian syndrome   ? PTSD (post-traumatic stress disorder)   ? 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 Sleep apnea   ? Trichomonas contact, treated   ? Type 2 diabetes mellitus (HCC)   ? Vitamin D deficiency   ? Yeast infection   ? ?Past Surgical History:  ?Procedure Laterality Date  ? CESAREAN SECTION  09/24/2012  ? Procedure: CESAREAN SECTION;  Surgeon: Willodean Rosenthal, MD;  Location: [facility];  Service: Gynecology;  Laterality: N/A;  ? CHOLECYSTECTOMY    ? CHOLECYSTECTOMY N/A 04/05/2013  ? Procedure: LAPAROSCOPIC CHOLECYSTECTOMY;  Surgeon: Dalia Heading, MD;  Location: [facility];  Service: General;  Laterality: N/A;  ? TONSILLECTOMY    ? ?Social History  ? ?Tobacco Use  ?Smoking Status Former  ? Years: 3.00  ? Types: Cigarettes  ? Quit date: 01/17/2012  ? Years since quitting: 10.1  ?Smokeless Tobacco Never  ?Has had some recent weight loss-12 lbs ? ? ?[name] ?03/17/2022, 4:45 PM ? ? ?

## 2022-03-21 ENCOUNTER — Ambulatory Visit: Payer: Medicaid Other

## 2022-03-21 ENCOUNTER — Telehealth: Payer: Self-pay

## 2022-03-21 NOTE — Progress Notes (Deleted)
Patient ID: NIMAH UPHOFF                 DOB: 1986-01-25                      MRN: [id number]     HPI: [name] is a 36 y.o. female referred by Dr. Duke Salvia to HTN clinic. PMH is significant for PCOS, HTN, DM, depression, and OSA.  She has had HTN since 36 years old.  Seen by Dr Duke Salvia on 11/26/21 for first visit with [facility].  Patient randomized to group 2 for study. At visit, patient was started on spironolactone and hydralazine was increased.  Patient presents today in good spirits.  Has been checking BP at home which connects to app on her phone.    Recent readings: 138/92 183/129 142/93 172/116 169/114 172/116 149/103 142/93 138/92 173/113  PCP has recently started her on Rybelsus which has helped her lose weight.  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  Tries to walk around her complex on breaks but for the majority of time her job is sedentary.  Has been going to the gym 3 times a week with her son. Does treadmill, stair machine, and weights.  Each visit to the gym last about 2 and a half hours.  Uses a CPAP at night.  Has tried to reduce sodium in her diet. Now uses Mrs Sharilyn Sites and salt free seasonings.  Drinks 1 cup of coffee daily. Uses IUD for contraceptions.  Was instructed to hold her lisinopril/HCTZ for aldosterone/renin lab work and then restart.  Unfortunately she misunderstood the directions and never restarted the lisinopril/HCTZ.  Has not taken since January.  Was supposed to have BMP drawn as well since starting spironolactone but for some reason this was not drawn at the time.  Current HTN meds:  Spironolactone 25mg  daily Amlodipine 5mg  Hydralazine 100mg  daily TID Lisinopril/HCTZ 20/25mg  daily not taking Furosemide 20mg  daily PRN  BP goal: <130/80  Wt Readings from Last 3 Encounters:  03/17/22 282 lb 3.2 oz  (128 kg)  03/14/22 282 lb (127.9 kg)  02/18/22 287 lb (130.2 kg)   BP Readings from Last 3 Encounters:  03/17/22 130/80  03/14/22 (!) 140/99  02/18/22 (!) 149/106   Pulse Readings from Last 3 Encounters:  03/17/22 71  03/14/22 69  02/18/22 73    Renal function: CrCl cannot be calculated (Patient's most recent lab result is older than the maximum 21 days allowed.).  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    History of kidney stones    Hypertension    IUD (intrauterine device) in place    Migraines    Neuropathy    Obese    Papanicolaou smear of cervix with positive high risk human papilloma virus (HPV) test 03/22/2021   03/22/21 repeat pap in 1 year per ASCCP guideline, 5 year risk for CIN 3+ is 2.25 %   Polycystic ovarian syndrome    PTSD (post-traumatic stress disorder)  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   Sleep apnea    Trichomonas contact, treated    Type 2 diabetes mellitus (HCC)    Vitamin D deficiency    Yeast infection     Current Outpatient Medications on File Prior to Visit  Medication Sig Dispense Refill   acetaminophen (TYLENOL) 500 MG tablet Take 1,000 mg by mouth as needed.     AIMOVIG 140 MG/ML SOAJ SMARTSIG:140 Milligram(s) SUB-Q     amLODipine (NORVASC) 5 MG tablet Take 1 tablet by mouth daily.     baclofen (LIORESAL) 10 MG tablet Take 10 mg by mouth 3 (three) times daily as needed.     buPROPion (WELLBUTRIN XL) 150 MG 24 hr tablet Take by mouth.     buPROPion (WELLBUTRIN XL) 300 MG 24 hr tablet Take by mouth.     Cholecalciferol 5000 units capsule Take 1 capsule (5,000 Units total) by mouth daily.     cyclobenzaprine (FLEXERIL) 10 MG tablet TAKE ONE TABLET BY MOUTH EVERY 6 HOURS AS NEEDED FOR  MUSCLE SPASM 30 tablet 0   DULoxetine (CYMBALTA) 60 MG capsule Take 60 mg by mouth daily.     fluconazole (DIFLUCAN) 150 MG tablet Take 1 now and 1 in 3 days 2 tablet 2   furosemide (LASIX) 20 MG tablet Take 20 mg by mouth as needed.     gabapentin (NEURONTIN) 100 MG capsule Take 100 mg by mouth daily.     gabapentin (NEURONTIN) 300 MG capsule Take 1 capsule (300 mg total) by mouth 3 (three) times daily. 90 capsule 0   guanFACINE (TENEX) 1 MG tablet Take 1 mg by mouth at bedtime.     hydrALAZINE (APRESOLINE) 100 MG tablet Take 1 tablet by mouth in the morning, at noon, and at bedtime.     hydrOXYzine (ATARAX/VISTARIL) 50 MG tablet Take 50 mg by mouth 3 (three) times daily.     JARDIANCE 10 MG TABS tablet Take 10 mg by mouth daily.     levonorgestrel (MIRENA) 20 MCG/24HR IUD 1 each by Intrauterine route once.     lisinopril-hydrochlorothiazide (ZESTORETIC) 20-25 MG tablet Take 1 tablet by mouth daily.     montelukast (SINGULAIR) 10 MG tablet Take 1 tablet by mouth daily as needed.     nystatin cream (MYCOSTATIN) Apply 1 application. topically 2 (two) times daily. 30 g 3   pantoprazole (PROTONIX) 20 MG tablet Take 20 mg by mouth daily.     PROAIR HFA 108 (90 Base) MCG/ACT inhaler SMARTSIG:2 Puff(s) By Mouth Every 4-6 Hours PRN     promethazine (PHENERGAN) 25 MG tablet Take 1 tablet (25 mg total) by mouth every 6 (six) hours as needed. 10 tablet 0   Semaglutide 3 MG TABS Take 1 tablet by mouth every morning. 30 tablet 2   spironolactone (ALDACTONE) 25 MG tablet Take 1 tablet (25 mg total) by mouth daily. 90 tablet 3   SUMAtriptan (IMITREX) 100 MG tablet Take 1 tablet by mouth as needed.     traZODone (DESYREL) 50 MG tablet TAKE 1 TABLET BY MOUTH AS NEEDED AT BEDTIME     triamcinolone (KENALOG) 0.025 % cream Apply 1 application. topically 2 (two) times daily. 30 g 3   Ubrogepant 100 MG TABS Take by mouth. Take 100 mg by mouth as needed. May repeat in 2 hours.  Max 200 mg/24 hours     valACYclovir  (VALTREX) 1000 MG tablet Take 1 tablet (1,000 mg total) by mouth daily. 90 tablet 3   Vitamin D, Ergocalciferol, (  DRISDOL) 1.25 MG (50000 UT) CAPS capsule Take 50,000 Units by mouth every 7 (seven) days.     No current facility-administered medications on file prior to visit.    No Known Allergies   Assessment/Plan:  1. Hypertension -  Patient BP in room today 149/106 which is above goal of <130/80. Patient concerned her diastolic readings are usually elevated. Unfortunately did not realize she was supposed to restart Lisinopril/HCTZ.  Will restart at this time.  Will update BMP today to check renal function and electrolytes since starting spironolactone  Encouraged patient to continue positive lifestyle changes such as exercise and diet changes. Encouraged continued sodium reduction and continuing at least 150 minutes a week of exercise.  Recheck in 1 month for HTN visit #2  Laural Golden, PharmD, BCACP, CDCES, CPP [address] [place], [place], [address] Phone: [phone], Fax: [phone]

## 2022-03-21 NOTE — Telephone Encounter (Signed)
Unable to [facility] for missed pharmd appt voice mail box full ?

## 2022-03-22 NOTE — Progress Notes (Unsigned)
[facility] PREP Weekly Session ? ?Patient Details  ?Name: [name] ?MRN: [id number] ?Date of Birth: 1986/06/30 ?Age: 36 y.o. ?PCP: Pearson Grippe, MD ? ?Vitals:  ? 03/22/22 1400  ?Weight: 285 lb 3.2 oz (129.4 kg)  ? ? ? [facility] Weekly seesion - 03/22/22 1700   ? ?  ? [facility]PREP[facility]  ? [facility]   ?  ? Weekly Session  ? Topic Discussed Goal setting and welcome to the program   scale of perceived exertion  ? Classes attended to date 1   ? ?  ?  ? ?  ? ? ?Bonnye Fava ?03/22/2022, 5:07 PM ? ? ?

## 2022-04-06 DIAGNOSIS — G4733 Obstructive sleep apnea (adult) (pediatric): Secondary | ICD-10-CM | POA: Diagnosis not present

## 2022-04-11 DIAGNOSIS — G4733 Obstructive sleep apnea (adult) (pediatric): Secondary | ICD-10-CM | POA: Diagnosis not present

## 2022-04-11 DIAGNOSIS — G43709 Chronic migraine without aura, not intractable, without status migrainosus: Secondary | ICD-10-CM | POA: Diagnosis not present

## 2022-04-11 DIAGNOSIS — F32A Depression, unspecified: Secondary | ICD-10-CM | POA: Diagnosis not present

## 2022-04-12 NOTE — Progress Notes (Signed)
[facility] PREP Weekly Session  Patient Details  Name: [name] MRN: [id number] Date of Birth: 29-Oct-1986 Age: 36 y.o. PCP: Pearson Grippe, MD  Vitals:   04/12/22 1638  Weight: 276 lb (125.2 kg)     [facility] Weekly seesion - 04/12/22 1600       [facility] "PREP" Location   [facility]PREP[facility] Location [facility]      Weekly Session   Topic Discussed Health habits    Minutes exercised this week 135 minutes    Classes attended to date 6           Struggling with no appetite(med related) Only eating 1x per day.  Will need to be sure she gets enough protein to protect muscle mass.  Weight 282.2 to 276 today.    Bonnye Fava 04/12/2022, 4:38 PM

## 2022-04-19 NOTE — Progress Notes (Signed)
[facility] PREP Weekly Session  Patient Details  Name: NICLE CONNOLE MRN: [id number] Date of Birth: 1986-02-01 Age: 36 y.o. PCP: Pearson Grippe, MD  Vitals:   04/19/22 1400  Weight: 270 lb 9.6 oz (122.7 kg)     [facility] Weekly seesion - 04/19/22 1600       [facility] "PREP" Location   [facility]PREP[facility] Location [facility]      Weekly Session   Topic Discussed Restaurant Eating   Salt and sugar demo   Minutes exercised this week 90 minutes    Classes attended to date 8            C/O feet hurting. Had difficulty sitting in class. Had swelling yesterday around ankles.  Is wearing compression hose which makes it a bit better.  Bonnye Fava 04/19/2022, 4:09 PM

## 2022-05-06 DIAGNOSIS — G4733 Obstructive sleep apnea (adult) (pediatric): Secondary | ICD-10-CM | POA: Diagnosis not present

## 2022-05-11 DIAGNOSIS — W57XXXA Bitten or stung by nonvenomous insect and other nonvenomous arthropods, initial encounter: Secondary | ICD-10-CM | POA: Diagnosis not present

## 2022-05-11 DIAGNOSIS — E559 Vitamin D deficiency, unspecified: Secondary | ICD-10-CM | POA: Diagnosis not present

## 2022-05-11 DIAGNOSIS — G4700 Insomnia, unspecified: Secondary | ICD-10-CM | POA: Diagnosis not present

## 2022-05-11 DIAGNOSIS — K5900 Constipation, unspecified: Secondary | ICD-10-CM | POA: Diagnosis not present

## 2022-05-11 DIAGNOSIS — Z79899 Other long term (current) drug therapy: Secondary | ICD-10-CM | POA: Diagnosis not present

## 2022-05-11 DIAGNOSIS — F419 Anxiety disorder, unspecified: Secondary | ICD-10-CM | POA: Diagnosis not present

## 2022-05-11 DIAGNOSIS — E1140 Type 2 diabetes mellitus with diabetic neuropathy, unspecified: Secondary | ICD-10-CM | POA: Diagnosis not present

## 2022-05-11 DIAGNOSIS — I10 Essential (primary) hypertension: Secondary | ICD-10-CM | POA: Diagnosis not present

## 2022-06-17 NOTE — Progress Notes (Signed)
PREP final note Attended 11 exercise sessions and 4 education sessions Withdrew before finishing due to work schedule changes.

## 2023-02-01 ENCOUNTER — Emergency Department (HOSPITAL_COMMUNITY): Payer: Medicaid Other

## 2023-02-01 ENCOUNTER — Emergency Department (HOSPITAL_COMMUNITY)
Admission: EM | Admit: 2023-02-01 | Discharge: 2023-02-01 | Disposition: A | Discharge status: Home / Self Care | Payer: Medicaid Other | Attending: Student | Admitting: Student

## 2023-02-01 ENCOUNTER — Other Ambulatory Visit: Payer: Self-pay

## 2023-02-01 DIAGNOSIS — R0602 Shortness of breath: Secondary | ICD-10-CM | POA: Diagnosis not present

## 2023-02-01 DIAGNOSIS — E876 Hypokalemia: Secondary | ICD-10-CM | POA: Insufficient documentation

## 2023-02-01 DIAGNOSIS — F419 Anxiety disorder, unspecified: Secondary | ICD-10-CM | POA: Diagnosis not present

## 2023-02-01 DIAGNOSIS — R079 Chest pain, unspecified: Secondary | ICD-10-CM | POA: Diagnosis not present

## 2023-02-01 DIAGNOSIS — R0789 Other chest pain: Secondary | ICD-10-CM | POA: Diagnosis not present

## 2023-02-01 LAB — CBC WITH DIFFERENTIAL/PLATELET
Abs Immature Granulocytes: 0.02 10*3/uL (ref 0.00–0.07)
Basophils Absolute: 0 10*3/uL (ref 0.0–0.1)
Basophils Relative: 0 %
Eosinophils Absolute: 0.1 10*3/uL (ref 0.0–0.5)
Eosinophils Relative: 1 %
HCT: 39.2 % (ref 36.0–46.0)
Hemoglobin: 13.5 g/dL (ref 12.0–15.0)
Immature Granulocytes: 0 %
Lymphocytes Relative: 53 %
Lymphs Abs: 3.6 10*3/uL (ref 0.7–4.0)
MCH: 31 pg (ref 26.0–34.0)
MCHC: 34.4 g/dL (ref 30.0–36.0)
MCV: 89.9 fL (ref 80.0–100.0)
Monocytes Absolute: 0.5 10*3/uL (ref 0.1–1.0)
Monocytes Relative: 7 %
Neutro Abs: 2.8 10*3/uL (ref 1.7–7.7)
Neutrophils Relative %: 39 %
Platelets: 253 10*3/uL (ref 150–400)
RBC: 4.36 MIL/uL (ref 3.87–5.11)
RDW: 13.2 % (ref 11.5–15.5)
WBC: 7.1 10*3/uL (ref 4.0–10.5)
nRBC: 0 % (ref 0.0–0.2)

## 2023-02-01 LAB — COMPREHENSIVE METABOLIC PANEL
ALT: 25 U/L (ref 0–44)
AST: 25 U/L (ref 15–41)
Albumin: 3.9 g/dL (ref 3.5–5.0)
Alkaline Phosphatase: 92 U/L (ref 38–126)
Anion gap: 10 (ref 5–15)
BUN: 10 mg/dL (ref 6–20)
CO2: 21 mmol/L — ABNORMAL LOW (ref 22–32)
Calcium: 8.8 mg/dL — ABNORMAL LOW (ref 8.9–10.3)
Chloride: 106 mmol/L (ref 98–111)
Creatinine, Ser: 0.7 mg/dL (ref 0.44–1.00)
GFR, Estimated: >60 mL/min (ref >60)
Glucose, Bld: 101 mg/dL — ABNORMAL HIGH (ref 70–99)
Potassium: 3.4 mmol/L — ABNORMAL LOW (ref 3.5–5.1)
Sodium: 137 mmol/L (ref 135–145)
Total Bilirubin: 0.6 mg/dL (ref 0.3–1.2)
Total Protein: 7.4 g/dL (ref 6.5–8.1)

## 2023-02-01 LAB — TROPONIN I (HIGH SENSITIVITY): Troponin I (High Sensitivity): 2 ng/L (ref <18)

## 2023-02-01 MED ORDER — AMLODIPINE BESYLATE 5 MG PO TABS
5.0000 mg | ORAL_TABLET | Freq: Once | ORAL | Status: AC
  Administered 2023-02-01: 5 mg via ORAL
  Filled 2023-02-01: qty 1

## 2023-02-01 MED ORDER — HYDROXYZINE HCL 25 MG PO TABS
50.0000 mg | ORAL_TABLET | Freq: Once | ORAL | Status: AC
  Administered 2023-02-01: 50 mg via ORAL
  Filled 2023-02-01: qty 2

## 2023-02-01 MED ORDER — HYDROXYZINE HCL 25 MG PO TABS: 25.0000 mg | ORAL_TABLET | Freq: Four times a day (QID) | ORAL | 0 refills | Status: DC

## 2023-02-01 MED ORDER — HYDRALAZINE HCL 20 MG/ML IJ SOLN: 10.0000 mg | Freq: Once | INTRAMUSCULAR | Status: DC

## 2023-02-01 MED ORDER — HYDRALAZINE HCL 25 MG PO TABS
100.0000 mg | ORAL_TABLET | Freq: Once | ORAL | Status: AC
  Administered 2023-02-01: 100 mg via ORAL
  Filled 2023-02-01: qty 4

## 2023-02-01 NOTE — ED Triage Notes (Signed)
Pt complains of SOB x 6 days. Denies cough. Worse with exertion. Pt has not had anxiety medication for 3 months due to issue with insurance.

## 2023-02-01 NOTE — Discharge Instructions (Addendum)
Today your workup was reassuring, please follow-up with your primary care doctor, take your blood pressure once again when at home.  You need to make sure that you are taking your blood pressure medication every day, to ensure that your blood pressure is well-controlled.  If you have worsening chest pain shortness shortness of breath, return to the ER.

## 2023-02-01 NOTE — ED Provider Notes (Signed)
[facility] Provider Note   CSN: [id number] Arrival date & time: 02/01/23  1217     History  Chief Complaint  Patient presents with   Shortness of [place] is a 37 y.o. female, history of GAD, the ED secondary to chest tightness, shortness of breath that is been going on for the past week.  She states that it started about 6 days ago, all of a sudden, and has been progressively getting worse.  She states that it is a 10 out of 10, and feels like she is being squeezed inside.  Denies any nausea, vomiting, abdominal pain.  No recent illnesses, no recent injuries.  Does state that she has been off her anxiety medication for the last couple months since there is an issue with Medicaid, and that has been very difficult for her.  Denies any fevers or chills.  No radiation of the pain.     Home Medications Prior to Admission medications   Medication Sig Start Date End Date Taking? Authorizing Provider  amLODipine (NORVASC) 5 MG tablet Take 1 tablet by mouth daily. 10/09/21  Yes [provider]  baclofen (LIORESAL) 10 MG tablet Take 10 mg by mouth 3 (three) times daily as needed. 11/10/20  Yes [provider]  hydrOXYzine (ATARAX) 25 MG tablet Take 1 tablet (25 mg total) by mouth every 6 (six) hours. 02/01/23  Yes Moussa Wiegand L, PA  Vitamin D, Ergocalciferol, (DRISDOL) 1.25 MG (50000 UT) CAPS capsule Take 50,000 Units by mouth every 7 (seven) days.   Yes [provider]  acetaminophen (TYLENOL) 500 MG tablet Take 1,000 mg by mouth as needed.    [provider]  Cholecalciferol 5000 units capsule Take 1 capsule (5,000 Units total) by mouth daily. Patient not taking: Reported on 02/01/2023 12/05/17   Derrek Monaco A, NP  cyclobenzaprine (FLEXERIL) 10 MG tablet TAKE ONE TABLET BY MOUTH EVERY 6 HOURS AS NEEDED FOR MUSCLE SPASM Patient not taking: Reported on 02/01/2023 01/23/17   Florian Buff, MD   DULoxetine (CYMBALTA) 60 MG capsule Take 60 mg by mouth daily. Patient not taking: Reported on 02/01/2023    [provider]  fluconazole (DIFLUCAN) 150 MG tablet Take 1 now and 1 in 3 days Patient not taking: Reported on 02/01/2023 03/14/22   Estill Dooms, NP  furosemide (LASIX) 20 MG tablet Take 20 mg by mouth as needed. Patient not taking: Reported on 02/01/2023    [provider]  gabapentin (NEURONTIN) 300 MG capsule Take 1 capsule (300 mg total) by mouth 3 (three) times daily. 04/28/21 11/25/21  Faustino Congress, NP  guanFACINE (TENEX) 1 MG tablet Take 1 mg by mouth at bedtime. Patient not taking: Reported on 02/01/2023    [provider]  hydrALAZINE (APRESOLINE) 100 MG tablet Take 1 tablet by mouth in the morning, at noon, and at bedtime. Patient not taking: Reported on 02/01/2023    [provider]  hydrOXYzine (ATARAX/VISTARIL) 50 MG tablet Take 50 mg by mouth 3 (three) times daily. Patient not taking: Reported on 02/01/2023 06/12/21   [provider]  JARDIANCE 10 MG TABS tablet Take 10 mg by mouth daily. Patient not taking: Reported on 02/01/2023 02/02/21   [provider]  levonorgestrel (MIRENA) 20 MCG/24HR IUD 1 each by Intrauterine route once.    [provider]  lisinopril-hydrochlorothiazide (ZESTORETIC) 20-25 MG tablet Take 1 tablet by mouth daily. Patient not taking: Reported on  02/01/2023    [provider]  montelukast (SINGULAIR) 10 MG tablet Take 1 tablet by mouth daily as needed. Patient not taking: Reported on 02/01/2023 03/03/21   [provider]  nystatin cream (MYCOSTATIN) Apply 1 application. topically 2 (two) times daily. Patient not taking: Reported on 02/01/2023 03/14/22   Derrek Monaco A, NP  pantoprazole (PROTONIX) 20 MG tablet Take 20 mg by mouth daily. Patient not taking: Reported on 02/01/2023 02/02/21   [provider]  PROAIR HFA 108 [phone] Base) MCG/ACT inhaler SMARTSIG:2  Puff(s) By Mouth Every 4-6 Hours PRN Patient not taking: Reported on 02/01/2023 11/10/20   [provider]  promethazine (PHENERGAN) 25 MG tablet Take 1 tablet (25 mg total) by mouth every 6 (six) hours as needed. Patient not taking: Reported on 02/01/2023 04/23/16   Nat Christen, MD  Semaglutide 3 MG TABS Take 1 tablet by mouth every morning. Patient not taking: Reported on 02/01/2023 02/18/22   Skeet Latch, MD  spironolactone (ALDACTONE) 25 MG tablet Take 1 tablet (25 mg total) by mouth daily. Patient not taking: Reported on 02/01/2023 11/25/21   Skeet Latch, MD  SUMAtriptan (IMITREX) 100 MG tablet Take 1 tablet by mouth as needed. 10/11/21   [provider]  traZODone (DESYREL) 50 MG tablet TAKE 1 TABLET BY MOUTH AS NEEDED AT BEDTIME Patient not taking: Reported on 02/01/2023 11/13/20   [provider]  triamcinolone (KENALOG) 0.025 % cream Apply 1 application. topically 2 (two) times daily. Patient not taking: Reported on 02/01/2023 03/14/22   Estill Dooms, NP  Ubrogepant 100 MG TABS Take by mouth. Take 100 mg by mouth as needed. May repeat in 2 hours.  Max 200 mg/24 hours Patient not taking: Reported on 02/01/2023 10/11/21   [provider]  valACYclovir (VALTREX) 1000 MG tablet Take 1 tablet (1,000 mg total) by mouth daily. Patient not taking: Reported on 02/01/2023 03/14/22   Estill Dooms, NP      Allergies    Patient has no known allergies.    Review of Systems   Review of Systems  Respiratory:  Positive for chest tightness and shortness of breath.   Gastrointestinal:  Negative for abdominal pain.    Physical Exam Updated Vital Signs BP (!) 174/112   Pulse 73   Temp 98.5 F (36.9 C) (Oral)   Resp (!) 21   Ht 5\' 7"  (1.702 m)   Wt 123.4 kg   SpO2 100%   BMI 42.60 kg/m  Physical Exam Vitals and nursing note reviewed.  Constitutional:      General: She is not in acute distress.    Appearance: She is well-developed.  HENT:      Head: Normocephalic and atraumatic.  Eyes:     Conjunctiva/sclera: Conjunctivae normal.  Cardiovascular:     Rate and Rhythm: Normal rate and regular rhythm.     Heart sounds: No murmur heard. Pulmonary:     Effort: Pulmonary effort is normal. No respiratory distress.     Breath sounds: Normal breath sounds.  Abdominal:     Palpations: Abdomen is soft.     Tenderness: There is no abdominal tenderness.  Musculoskeletal:        General: No swelling.     Cervical back: Neck supple.  Skin:    General: Skin is warm and dry.     Capillary Refill: Capillary refill takes less than 2 seconds.  Neurological:     Mental Status: She is alert.  Psychiatric:  Mood and Affect: Mood normal.     ED Results / Procedures / Treatments   Labs (all labs ordered are listed, but only abnormal results are displayed) Labs Reviewed  COMPREHENSIVE METABOLIC PANEL - Abnormal; Notable for the following components:      Result Value   Potassium 3.4 (*)    CO2 21 (*)    Glucose, Bld 101 (*)    Calcium 8.8 (*)    All other components within normal limits  CBC WITH DIFFERENTIAL/PLATELET  I-STAT BETA HCG BLOOD, ED ([facility], [facility], [facility] ONLY)  TROPONIN I (HIGH SENSITIVITY)    EKG None  Radiology DG Chest 2 View  Result Date: 02/01/2023 CLINICAL DATA:  Shortness of breath EXAM: CHEST - 2 VIEW COMPARISON:  CXR 09/21/15 FINDINGS: No pleural effusion. No pneumothorax. Normal cardiac and mediastinal contours. No focal airspace opacity. No radiographically apparent displaced rib fractures. Visualized upper abdomen is unremarkable. Vertebral body heights are maintained. IMPRESSION: No active cardiopulmonary disease. Electronically Signed   By: Marin Roberts M.D.   On: 02/01/2023 13:36    Procedures Procedures    Medications Ordered in ED Medications  hydrOXYzine (ATARAX) tablet 50 mg (50 mg Oral Given 02/01/23 1331)  amLODipine (NORVASC) tablet 5 mg (5 mg Oral Given 02/01/23 1425)  hydrALAZINE (APRESOLINE)  tablet 100 mg (100 mg Oral Given 02/01/23 1431)    ED Course/ Medical Decision Making/ A&P                             Medical Decision Making Patient is a 37 year old female, here for chest tightness, shortness of breath has been going on for the last 6 days.  She has been out of her anxiety meds, and she thinks that may be playing a role in it.  We will obtain troponins, chest x-ray, for further evaluation.  She is well-appearing, nonlabored respirations, and not tachycardic.  PERC negative.  She is hypertensive however she did not take her blood pressure medications today.  Amount and/or Complexity of Data Reviewed Labs: ordered.    Details: Troponin within normal limits Radiology: ordered.    Details: Chest x-ray clear Discussion of management or test interpretation with external provider(s): Discussed with patient, heart score of 1.  Negative troponin, is well-appearing, improved after hydroxyzine.  I am suspicious that her symptoms are likely secondary to anxiety, I emphasized the importance of following up with her primary care doctor she has an appointment with him on Friday.  Additionally I sent her hydroxyzine to the pharmacy to make sure that her anxiety is little bit better controlled.  Return precautions were emphasized.  She is well-appearing on discharge.  Risk Prescription drug management.    Final Clinical Impression(s) / ED Diagnoses Final diagnoses:  Chest pain, unspecified type  Anxiety  SOB (shortness of breath)    Rx / DC Orders ED Discharge Orders          Ordered    hydrOXYzine (ATARAX) 25 MG tablet  Every 6 hours        02/01/23 1429              Philip Kotlyar Carlean Jews, [place] 02/01/23 1431    Teressa Lower, MD 02/01/23 2136

## 2023-02-03 DIAGNOSIS — F332 Major depressive disorder, recurrent severe without psychotic features: Secondary | ICD-10-CM | POA: Diagnosis not present

## 2023-02-03 DIAGNOSIS — F411 Generalized anxiety disorder: Secondary | ICD-10-CM | POA: Diagnosis not present

## 2023-02-03 DIAGNOSIS — I10 Essential (primary) hypertension: Secondary | ICD-10-CM | POA: Diagnosis not present

## 2023-02-06 ENCOUNTER — Ambulatory Visit (INDEPENDENT_AMBULATORY_CARE_PROVIDER_SITE_OTHER): Payer: Medicaid Other | Admitting: Internal Medicine

## 2023-02-06 ENCOUNTER — Encounter: Payer: Self-pay | Admitting: Internal Medicine

## 2023-02-06 VITALS — BP 151/95 | HR 84 | Resp 16 | Ht 67.0 in | Wt 274.0 lb

## 2023-02-06 DIAGNOSIS — Z0001 Encounter for general adult medical examination with abnormal findings: Secondary | ICD-10-CM

## 2023-02-06 DIAGNOSIS — F332 Major depressive disorder, recurrent severe without psychotic features: Secondary | ICD-10-CM

## 2023-02-06 DIAGNOSIS — E1142 Type 2 diabetes mellitus with diabetic polyneuropathy: Secondary | ICD-10-CM

## 2023-02-06 DIAGNOSIS — I1A0 Resistant hypertension: Secondary | ICD-10-CM | POA: Diagnosis not present

## 2023-02-06 HISTORY — DX: Encounter for general adult medical examination with abnormal findings: Z00.01

## 2023-02-06 MED ORDER — LISINOPRIL-HYDROCHLOROTHIAZIDE 20-25 MG PO TABS: 1.0000 | ORAL_TABLET | Freq: Every day | ORAL | 1 refills | Status: DC

## 2023-02-06 MED ORDER — BUPROPION HCL ER (XL) 150 MG PO TB24: 150.0000 mg | ORAL_TABLET | Freq: Every day | ORAL | 0 refills | Status: DC

## 2023-02-06 MED ORDER — HYDROXYZINE HCL 25 MG PO TABS: 25.0000 mg | ORAL_TABLET | Freq: Four times a day (QID) | ORAL | 0 refills | Status: DC

## 2023-02-06 NOTE — Patient Instructions (Addendum)
Thank you, [name] for allowing [place] to provide your care today.   I have ordered the following labs for you:  Lab Orders         Lipid panel         CMP14+EGFR         CBC with Differential/Platelet         TSH         Hemoglobin A1c         VITAMIN D 25 Hydroxy (Vit-D Deficiency, Fractures)         Hepatitis C Antibody         Microalbumin / creatinine urine ratio       Referrals ordered today:   Referral Orders         Ambulatory referral to Psychiatry         Amb ref to Integrated Behavioral Health       Reminders: Sign release forms for previous PCP.     Tamsen Snider, M.D.

## 2023-02-06 NOTE — Assessment & Plan Note (Addendum)
PHQ9 22. Her depression started in 2013 in post partum. She has followed with provdiers and treated with the following medications. Lexapro titrated up and MDD uncontrolled. Wellbutrin worked better for her attention. When taking more of Wellbutrin her anxiety was worse. The Cymbalta added and she felt stagnent. She has taken Xanax and Clonazepam. Clonazepam worked well.  The last medications regimen she was on 3 months ago: Wellbutrin 300 mg , Cymbalta 60 mg, and Hyroxyzine 50 mg as needed every 6 hours. She switched health symptoms and did not establish before running out of medications. She presented to ED and given hydroxyzine prescription.  Her last psychiatrist was going to test her for ADD and she would like to pursue this.  Plan: Referral to psychiatry for ADD Restart Wellbutrin 150 mg daily  Hydroxyzine 25 mg as needed Referral to [facility] for counseling  Patient and/or legal guardian verbally consented to [facility] services about presenting concerns and psychiatric consultation as appropriate.  The services will be billed as appropriate for the patient.

## 2023-02-06 NOTE — Assessment & Plan Note (Signed)
Patient has went to PREP and review of notes showed she had in earlier due to scheduling conflict.  She is walking daily and going to the gym. Encouraged to continue these activities.

## 2023-02-06 NOTE — Progress Notes (Unsigned)
HPI:[name] is a 37 y.o. female living with chronic migraines, morbid obesity, T2DM, resistant HTN  MDD, GAD, mixed obsessional thoughts and acts who presents to establish care. For the details of today's visit, please refer to the assessment and plan.  2 weeks ago she started having a rash - started on hydroxyzuune,. The rash continued to itch, but improving.    HTN Amlodopine 5 mg  Not taking lisnopril or hctz   Severe GAD and MDD Lexapro, Xanax, Clonazepam, Cymbalta up to 60 mg , Wellbutrin up to 300 mg , Hydroxyzine   [facility] Also at [facility].  *** Assessment/Plan: ***  *** Assessment/Plan: ***    Depression, PHQ-9: Based on the patients  [place] Visit from 02/06/2023 in [facility]  PHQ-9 Total Score 22      score we have ***.  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    History of kidney stones    Hypertension    IUD (intrauterine device) in place    Migraines    Neuropathy    Obese    Papanicolaou smear of cervix with positive high risk human papilloma virus (HPV) test 03/22/2021   03/22/21 repeat pap in 1 year per ASCCP guideline, 5 year risk for CIN 3+ is 2.25 %   Polycystic ovarian syndrome    PTSD (post-traumatic stress disorder)    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   Sleep apnea    Trichomonas contact, treated    Type 2 diabetes mellitus    Vitamin D deficiency    Yeast infection     Past Surgical History:  Procedure Laterality Date   CESAREAN SECTION  09/24/2012   Procedure: CESAREAN SECTION;  Surgeon: Lavonia Drafts, MD;  Location: [facility];  Service: Gynecology;  Laterality: N/A;   CHOLECYSTECTOMY      CHOLECYSTECTOMY N/A 04/05/2013   Procedure: LAPAROSCOPIC CHOLECYSTECTOMY;  Surgeon: Jamesetta So, MD;  Location: [facility];  Service: General;  Laterality: N/A;   TONSILLECTOMY      Family History  Problem Relation Age of Onset   Hypertension Mother    Diabetes Mother    Hypertension Father    Asthma Brother    Heart murmur Brother    Crohn's disease Brother    Diabetes Maternal Aunt    Hypertension Maternal Aunt    Diabetes Maternal Uncle    Congestive Heart Failure Maternal Uncle    Cancer Paternal Aunt        uterine   Diabetes Maternal Grandmother    Cancer - Lung Maternal Grandmother    Diabetes Maternal Grandfather    Hypertension Maternal Grandfather    Stroke Maternal Grandfather    COPD Paternal Grandfather     Social History   Tobacco Use   Smoking status: Former    Years: 3    Types: Cigarettes    Quit date: 2021    Years since quitting: 3.2   Smokeless tobacco: Never  Vaping Use   Vaping Use: Some days  Substance Use Topics   Alcohol use: Yes    Comment: occasionally  Drug use: No    Review of Systems:    ROS   Physical Exam: Vitals:   02/06/23 1430  BP: (!) 151/95  Pulse: 84  Resp: 16  SpO2: 93%  Weight: 274 lb (124.3 kg)  Height: 5\' 7"  (1.702 m)     Physical Exam   Assessment & Plan:   No problem-specific Assessment & Plan notes found for this encounter.    Lorene Dy, MD

## 2023-02-06 NOTE — Assessment & Plan Note (Signed)
Patient here to establish care. Will check baseline labs and screen for hepatitis C.

## 2023-02-06 NOTE — Assessment & Plan Note (Signed)
>>  ASSESSMENT AND PLAN FOR SEVERE EPISODE OF RECURRENT MAJOR DEPRESSIVE DISORDER, WITHOUT PSYCHOTIC FEATURES WRITTEN ON 02/07/2023  8:32 AM BY Gardenia Phlegm, MD  PHQ9 22. Her depression started in 2013 in post partum. She has followed with provdiers and treated with the following medications. Lexapro titrated up and MDD uncontrolled. Wellbutrin worked better for her attention. When taking more of Wellbutrin her anxiety was worse. The Cymbalta added and she felt stagnent. She has taken Xanax and Clonazepam. Clonazepam worked well.  The last medications regimen she was on 3 months ago: Wellbutrin 300 mg , Cymbalta 60 mg, and Hyroxyzine 50 mg as needed every 6 hours. She switched health symptoms and did not establish before running out of medications. She presented to ED and given hydroxyzine prescription.  Her last psychiatrist was going to test her for ADD and she would like to pursue this.  Plan: Referral to psychiatry for ADD Restart Wellbutrin 150 mg daily  Hydroxyzine 25 mg as needed Referral to [facility] for counseling  Patient and/or legal guardian verbally consented to [facility] services about presenting concerns and psychiatric consultation as appropriate.  The services will be billed as appropriate for the patient.

## 2023-02-06 NOTE — Assessment & Plan Note (Signed)
Previous workup for secondary hypertension did not show a cause for patient's resistant hypertension. She is only taking Amlodipine 5 mg at this time. BP 151/95. Restart HCTZ/Lisinopril 25/20 Follow up in 4 weeks

## 2023-02-07 LAB — LIPID PANEL
Chol/HDL Ratio: 3.1 ratio (ref 0.0–4.4)
Cholesterol, Total: 157 mg/dL (ref 100–199)
HDL: 50 mg/dL (ref >39)
LDL Chol Calc (NIH): 95 mg/dL (ref 0–99)
Triglycerides: 58 mg/dL (ref 0–149)
VLDL Cholesterol Cal: 12 mg/dL (ref 5–40)

## 2023-02-07 LAB — CMP14+EGFR
ALT: 16 IU/L (ref 0–32)
AST: 16 IU/L (ref 0–40)
Albumin/Globulin Ratio: 1.5 (ref 1.2–2.2)
Albumin: 4.3 g/dL (ref 3.9–4.9)
Alkaline Phosphatase: 122 IU/L — ABNORMAL HIGH (ref 44–121)
BUN/Creatinine Ratio: 13 (ref 9–23)
BUN: 10 mg/dL (ref 6–20)
Bilirubin Total: 0.3 mg/dL (ref 0.0–1.2)
CO2: 21 mmol/L (ref 20–29)
Calcium: 9.4 mg/dL (ref 8.7–10.2)
Chloride: 103 mmol/L (ref 96–106)
Creatinine, Ser: 0.76 mg/dL (ref 0.57–1.00)
Globulin, Total: 2.8 g/dL (ref 1.5–4.5)
Glucose: 97 mg/dL (ref 70–99)
Potassium: 3.9 mmol/L (ref 3.5–5.2)
Sodium: 138 mmol/L (ref 134–144)
Total Protein: 7.1 g/dL (ref 6.0–8.5)
eGFR: 104 mL/min/{1.73_m2} (ref >59)

## 2023-02-07 LAB — VITAMIN D 25 HYDROXY (VIT D DEFICIENCY, FRACTURES): Vit D, 25-Hydroxy: 18.3 ng/mL — ABNORMAL LOW (ref 30.0–100.0)

## 2023-02-07 LAB — HEMOGLOBIN A1C
Est. average glucose Bld gHb Est-mCnc: 123 mg/dL
Hgb A1c MFr Bld: 5.9 % — ABNORMAL HIGH (ref 4.8–5.6)

## 2023-02-07 LAB — CBC WITH DIFFERENTIAL/PLATELET
Basophils Absolute: 0 10*3/uL (ref 0.0–0.2)
Basos: 0 %
EOS (ABSOLUTE): 0.1 10*3/uL (ref 0.0–0.4)
Eos: 2 %
Hematocrit: 41.2 % (ref 34.0–46.6)
Hemoglobin: 13.7 g/dL (ref 11.1–15.9)
Immature Grans (Abs): 0 10*3/uL (ref 0.0–0.1)
Immature Granulocytes: 0 %
Lymphocytes Absolute: 4.1 10*3/uL — ABNORMAL HIGH (ref 0.7–3.1)
Lymphs: 56 %
MCH: 30.3 pg (ref 26.6–33.0)
MCHC: 33.3 g/dL (ref 31.5–35.7)
MCV: 91 fL (ref 79–97)
Monocytes Absolute: 0.5 10*3/uL (ref 0.1–0.9)
Monocytes: 7 %
Neutrophils Absolute: 2.6 10*3/uL (ref 1.4–7.0)
Neutrophils: 35 %
Platelets: 265 10*3/uL (ref 150–450)
RBC: 4.52 x10E6/uL (ref 3.77–5.28)
RDW: 13 % (ref 11.7–15.4)
WBC: 7.2 10*3/uL (ref 3.4–10.8)

## 2023-02-07 LAB — HEPATITIS C ANTIBODY: Hep C Virus Ab: NONREACTIVE

## 2023-02-07 LAB — TSH: TSH: 0.384 u[IU]/mL — ABNORMAL LOW (ref 0.450–4.500)

## 2023-02-07 NOTE — Assessment & Plan Note (Signed)
Patient is currently on Jardiance 10 mg. Her only Hemoglobin A1c in our system is 6.4 in 2018. She has not tolerated Metformin at 500 mg. Check Hemoglobin A1c

## 2023-02-14 ENCOUNTER — Other Ambulatory Visit: Payer: Self-pay | Admitting: Internal Medicine

## 2023-02-14 DIAGNOSIS — R7989 Other specified abnormal findings of blood chemistry: Secondary | ICD-10-CM

## 2023-02-14 MED ORDER — RYBELSUS 7 MG PO TABS: 7.0000 mg | ORAL_TABLET | Freq: Every day | ORAL | 2 refills | Status: DC

## 2023-02-14 NOTE — Progress Notes (Signed)
Called and reviewed labs. Her TSH was low. She is not on biotin supplement. Patient will come in for follow labs.She will continue Vitamin D supplementation and increase fish/fortified foods in diet. Recommended having repeat CMP at next office appointment to trend [facility]k [facility]os. Recommended stopping Jardiance. Increaseing Rybelsus from 3mg  to 7mg .

## 2023-02-16 ENCOUNTER — Ambulatory Visit: Payer: Medicaid Other | Admitting: Professional Counselor

## 2023-02-16 DIAGNOSIS — F332 Major depressive disorder, recurrent severe without psychotic features: Secondary | ICD-10-CM

## 2023-02-16 NOTE — Patient Instructions (Signed)
If your symptoms worsen or you have thoughts of suicide/homicide, PLEASE SEEK IMMEDIATE MEDICAL ATTENTION.  You may always call:   National Suicide Hotline: 988 or 714-344-3704 [place] Crisis Line: 289-520-6933 Crisis Recovery in [place]: [phone]
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
## 2023-02-21 ENCOUNTER — Telehealth: Payer: Self-pay | Admitting: Professional Counselor

## 2023-02-21 DIAGNOSIS — F332 Major depressive disorder, recurrent severe without psychotic features: Secondary | ICD-10-CM
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
## 2023-02-27 ENCOUNTER — Encounter: Payer: Self-pay | Admitting: Internal Medicine

## 2023-02-27 NOTE — Telephone Encounter (Signed)
Yes she can be scheduled for follow up.

## 2023-02-28 NOTE — Telephone Encounter (Signed)
Called patient her voicemail is full sent her a mychart message to call our office.

## 2023-03-01 ENCOUNTER — Ambulatory Visit: Payer: Self-pay | Admitting: Family Medicine

## 2023-03-01 VITALS — BP 125/85 | HR 100 | Ht 67.0 in | Wt 278.0 lb

## 2023-03-01 DIAGNOSIS — F32A Depression, unspecified: Secondary | ICD-10-CM

## 2023-03-01 DIAGNOSIS — F419 Anxiety disorder, unspecified: Secondary | ICD-10-CM

## 2023-03-01 NOTE — Progress Notes (Unsigned)
Established Patient Office Visit  Subjective:  Patient ID: [name], female    DOB: 06-16-1986  Age: 37 y.o. MRN: [id number]  CC:  Chief Complaint  Patient presents with   Anxiety    Pt c/o increase anxiety in the last mo; waking up w/ panic attacks during in the night; no know triggers;     HPI [name] is a 37 y.o. female with past medical history of anxiety, depression, and PTSD presents for f/u of  chronic medical conditions.  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    History of kidney stones    Hypertension    IUD (intrauterine device) in place    Migraines    Neuropathy    Obese    Papanicolaou smear of cervix with positive high risk human papilloma virus (HPV) test 03/22/2021   03/22/21 repeat pap in 1 year per ASCCP guideline, 5 year risk for CIN 3+ is 2.25 %   Polycystic ovarian syndrome    PTSD (post-traumatic stress disorder)    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   Sleep apnea    Trichomonas contact, treated    Type 2 diabetes mellitus    Vitamin D deficiency    Yeast infection     Past Surgical History:  Procedure Laterality Date   CESAREAN SECTION  09/24/2012   Procedure: CESAREAN SECTION;  Surgeon: Willodean Rosenthal, MD;  Location: [facility];  Service: Gynecology;  Laterality: N/A;   CHOLECYSTECTOMY     CHOLECYSTECTOMY N/A 04/05/2013   Procedure: LAPAROSCOPIC CHOLECYSTECTOMY;  Surgeon: Dalia Heading, MD;  Location: [facility];  Service: General;  Laterality: N/A;   TONSILLECTOMY      Family History  Problem Relation Age of Onset   Hypertension Mother    Diabetes Mother    Hypertension Father    Asthma Brother    Heart murmur Brother    Crohn's disease Brother    Diabetes  Maternal Aunt    Hypertension Maternal Aunt    Diabetes Maternal Uncle    Congestive Heart Failure Maternal Uncle    Cancer Paternal Aunt        uterine   Diabetes Maternal Grandmother    Cancer - Lung Maternal Grandmother    Diabetes Maternal Grandfather    Hypertension Maternal Grandfather    Stroke Maternal Grandfather    COPD Paternal Grandfather     Social History   Socioeconomic History   Marital status: Single    Spouse name: Not on file   Number of children: 1   Years of education: Not on file   Highest education level: 12th grade  Occupational History   Not on file  Tobacco Use   Smoking status: Former    Years: 3    Types: Cigarettes    Quit date: 2021    Years since quitting: 3.3   Smokeless tobacco: Never  Vaping Use   Vaping Use: Some days  Substance and Sexual Activity   Alcohol use: Yes    Comment: occasionally    Drug use: No   Sexual activity: Yes    Birth control/protection: I.U.D.  Other Topics Concern  Not on file  Social History Narrative   Not on file   Social Determinants of Health   Financial Resource Strain: Low Risk  (03/01/2023)   Overall Financial Resource Strain (CARDIA)    Difficulty of Paying Living Expenses: Not hard at all  Food Insecurity: No Food Insecurity (03/01/2023)   Hunger Vital Sign    Worried About Running Out of Food in the Last Year: Never true    Ran Out of Food in the Last Year: Never true  Transportation Needs: No Transportation Needs (03/01/2023)   PRAPARE - Administrator, Civil Service (Medical): No    Lack of Transportation (Non-Medical): No  Physical Activity: Sufficiently Active (03/01/2023)   Exercise Vital Sign    Days of Exercise per Week: 5 days    Minutes of Exercise per Session: 30 min  Stress: Stress Concern Present (03/01/2023)   [employer] of Occupational Health - Occupational Stress Questionnaire    Feeling of Stress : Very much  Social Connections: Moderately Integrated  (03/01/2023)   Social Connection and Isolation Panel [NHANES]    Frequency of Communication with Friends and Family: Twice a week    Frequency of Social Gatherings with Friends and Family: Once a week    Attends Religious Services: More than 4 times per year    Active Member of [employer] or Organizations: Yes    Attends Banker Meetings: More than 4 times per year    Marital Status: Never married  Intimate Partner Violence: Not At Risk (03/14/2022)   Humiliation, Afraid, Rape, and Kick questionnaire    Fear of Current or Ex-Partner: No    Emotionally Abused: No    Physically Abused: No    Sexually Abused: No    Outpatient Medications Prior to Visit  Medication Sig Dispense Refill   acetaminophen (TYLENOL) 500 MG tablet Take 1,000 mg by mouth as needed.     AIMOVIG 140 MG/ML SOAJ SMARTSIG:140 Milligram(s) SUB-Q Once a Month     amLODipine (NORVASC) 5 MG tablet Take 1 tablet by mouth daily.     baclofen (LIORESAL) 10 MG tablet Take 10 mg by mouth 3 (three) times daily as needed.     buPROPion (WELLBUTRIN XL) 150 MG 24 hr tablet Take 1 tablet (150 mg total) by mouth daily. 60 tablet 0   hydrOXYzine (ATARAX) 25 MG tablet Take 1 tablet (25 mg total) by mouth every 6 (six) hours. 90 tablet 0   JARDIANCE 10 MG TABS tablet Take 10 mg by mouth daily.     levonorgestrel (MIRENA) 20 MCG/24HR IUD 1 each by Intrauterine route once.     lisinopril-hydrochlorothiazide (ZESTORETIC) 20-25 MG tablet Take 1 tablet by mouth daily. 60 tablet 1   Semaglutide (RYBELSUS) 7 MG TABS Take 1 tablet (7 mg total) by mouth daily. 30 tablet 2   SUMAtriptan (IMITREX) 100 MG tablet Take 1 tablet by mouth as needed.     gabapentin (NEURONTIN) 300 MG capsule Take 1 capsule (300 mg total) by mouth 3 (three) times daily. 90 capsule 0   No facility-administered medications prior to visit.    No Known Allergies  ROS Review of Systems  Constitutional:  Negative for chills and fever.  Eyes:  Negative for visual  disturbance.  Respiratory:  Negative for chest tightness and shortness of breath.   Neurological:  Negative for dizziness and headaches.      Objective:    Physical Exam HENT:     Head: Normocephalic.  Mouth/Throat:     Mouth: Mucous membranes are moist.  Cardiovascular:     Rate and Rhythm: Normal rate.     Heart sounds: Normal heart sounds.  Pulmonary:     Effort: Pulmonary effort is normal.     Breath sounds: Normal breath sounds.  Neurological:     Mental Status: She is alert.  Psychiatric:     Comments: Tearful in the clinic     BP 125/85   Pulse 100   Ht  (1.702 m)   Wt 278 lb (126.1 kg)   SpO2 97%   BMI 43.54 kg/m  Wt Readings from Last 3 Encounters:  03/01/23 278 lb (126.1 kg)  02/06/23 274 lb (124.3 kg)  02/01/23 272 lb (123.4 kg)    Lab Results  Component Value Date   TSH 0.384 (L) 02/06/2023   Lab Results  Component Value Date   WBC 7.2 02/06/2023   HGB 13.7 02/06/2023   HCT 41.2 02/06/2023   MCV 91 02/06/2023   PLT 265 02/06/2023   Lab Results  Component Value Date   NA 138 02/06/2023   K 3.9 02/06/2023   CO2 21 02/06/2023   GLUCOSE 97 02/06/2023   BUN 10 02/06/2023   CREATININE 0.76 02/06/2023   BILITOT 0.3 02/06/2023   ALKPHOS 122 (H) 02/06/2023   AST 16 02/06/2023   ALT 16 02/06/2023   PROT 7.1 02/06/2023   ALBUMIN 4.3 02/06/2023   CALCIUM 9.4 02/06/2023   ANIONGAP 10 02/01/2023   EGFR 104 02/06/2023   Lab Results  Component Value Date   CHOL 157 02/06/2023   Lab Results  Component Value Date   HDL 50 02/06/2023   Lab Results  Component Value Date   LDLCALC 95 02/06/2023   Lab Results  Component Value Date   TRIG 58 02/06/2023   Lab Results  Component Value Date   CHOLHDL 3.1 02/06/2023   Lab Results  Component Value Date   HGBA1C 5.9 (H) 02/06/2023      Assessment & Plan:  Anxiety and depression Assessment & Plan: GAD-7 is 21 PHQ-9 is 24 Complains of extreme sadness, hopelessness with increased  worrying all the time Denies suicidal thoughts and ideation She reports that she has been in a dark place in the last month and does not feel that her symptoms are improving She notes being anxious all the time and crying all the time She has an overwhelming feeling of sadness, hopelessness, and worry that something bad will happen She has spoken with Arlys John, the licensed therapist and was informed that she will be contacted with the plan of care She is currently on Wellbutrin 150 mg daily, Cymbalta 60 mg daily and hydroxyzine 25 mg daily as needed She reports minimal relief of her symptoms with the current treatment regimen Encouraged to take hydroxyzine 50 mg every 6 hours as needed for her anxiety Inform the patient I will be in contact with Arlys John to follow-up on the plan of care      Follow-up: Return in about 2 weeks (around 03/15/2023) for anxiety and depression.   Gilmore Laroche, [name]

## 2023-03-01 NOTE — Patient Instructions (Addendum)
  I appreciate the opportunity to provide care to you today!    Follow up: 2 weeks  Please start taking hydroxyzine 50 mg every 6 hours as needed for anxiety Please continue taking Wellbutrin 150 mg daily and Cymbalta 60 mg daily for your depression  Please report to [facility] if you have thoughts of harming yourself or killing yourself  [email or web address]    Please continue to a heart-healthy diet and increase your physical activities. Try to exercise for at least five days a week.      It was a pleasure to see you and I look forward to continuing to work together on your health and well-being. Please do not hesitate to call the office if you need care or have questions about your care.   Have a wonderful day and week. With Gratitude, Gilmore Laroche MSN, [name]-BC

## 2023-03-02 NOTE — Assessment & Plan Note (Addendum)
GAD-7 is 21 PHQ-9 is 24 Complains of extreme sadness, hopelessness with increased worrying all the time Denies suicidal thoughts and ideation She reports that she has been in a dark place in the last month and does not feel that her symptoms are improving She notes being anxious all the time and crying all the time She has an overwhelming feeling of sadness, hopelessness, and worry that something bad will happen She has spoken with Arlys John, the licensed therapist and was informed that she will be contacted with the plan of care She is currently on Wellbutrin 150 mg daily, Cymbalta 60 mg daily and hydroxyzine 25 mg daily as needed She reports minimal relief of her symptoms with the current treatment regimen Encouraged to take hydroxyzine 50 mg every 6 hours as needed for her anxiety Inform the patient I will be in contact with Arlys John to follow-up on the plan of care

## 2023-03-02 NOTE — Assessment & Plan Note (Signed)
>>  ASSESSMENT AND PLAN FOR ANXIETY AND DEPRESSION WRITTEN ON 03/02/2023  5:18 PM BY Gilmore Laroche, [name]  GAD-7 is 21 PHQ-9 is 24 Complains of extreme sadness, hopelessness with increased worrying all the time Denies suicidal thoughts and ideation She reports that she has been in a dark place in the last month and does not feel that her symptoms are improving She notes being anxious all the time and crying all the time She has an overwhelming feeling of sadness, hopelessness, and worry that something bad will happen She has spoken with Arlys John, the licensed therapist and was informed that she will be contacted with the plan of care She is currently on Wellbutrin 150 mg daily, Cymbalta 60 mg daily and hydroxyzine 25 mg daily as needed She reports minimal relief of her symptoms with the current treatment regimen Encouraged to take hydroxyzine 50 mg every 6 hours as needed for her anxiety Inform the patient I will be in contact with Arlys John to follow-up on the plan of care

## 2023-03-05 ENCOUNTER — Other Ambulatory Visit: Payer: Self-pay | Admitting: Family Medicine

## 2023-03-05 DIAGNOSIS — F332 Major depressive disorder, recurrent severe without psychotic features: Secondary | ICD-10-CM

## 2023-03-06 ENCOUNTER — Ambulatory Visit: Payer: Medicaid Other | Admitting: Family Medicine

## 2023-03-08 ENCOUNTER — Encounter: Payer: Self-pay | Admitting: Family Medicine

## 2023-03-14 ENCOUNTER — Encounter: Payer: Self-pay | Admitting: Family Medicine

## 2023-03-14 ENCOUNTER — Ambulatory Visit (INDEPENDENT_AMBULATORY_CARE_PROVIDER_SITE_OTHER): Payer: Self-pay | Admitting: Family Medicine

## 2023-03-14 VITALS — BP 140/88 | HR 84 | Ht 67.0 in | Wt 273.0 lb

## 2023-03-14 DIAGNOSIS — F331 Major depressive disorder, recurrent, moderate: Secondary | ICD-10-CM

## 2023-03-14 NOTE — Progress Notes (Signed)
Established Patient Office Visit  Subjective:  Patient ID: [name], female    DOB: 11/21/1985  Age: 37 y.o. MRN: [id number]  CC:  Chief Complaint  Patient presents with   Chronic Care Management    Pt would like to discuss [employer] Forms for medical leave at work.     HPI [name] is a 37 y.o. female with past medical history of major depressive disorder, Generalized anxiety disorder presents for [facility] forms for medical leave at work. For the details of today's visit, please refer to the assessment and plan.    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    History of kidney stones    Hypertension    IUD (intrauterine device) in place    Migraines    Neuropathy    Obese    Papanicolaou smear of cervix with positive high risk human papilloma virus (HPV) test 03/22/2021   03/22/21 repeat pap in 1 year per ASCCP guideline, 5 year risk for CIN 3+ is 2.25 %   Polycystic ovarian syndrome    PTSD (post-traumatic stress disorder)    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   Sleep apnea    Trichomonas contact, treated    Type 2 diabetes mellitus (HCC)    Vitamin D deficiency    Yeast infection     Past Surgical History:  Procedure Laterality Date   CESAREAN SECTION  09/24/2012   Procedure: CESAREAN SECTION;  Surgeon: Willodean Rosenthal, MD;  Location: [facility];  Service: Gynecology;  Laterality: N/A;   CHOLECYSTECTOMY     CHOLECYSTECTOMY N/A 04/05/2013   Procedure: LAPAROSCOPIC CHOLECYSTECTOMY;  Surgeon: Dalia Heading, MD;  Location: [facility];  Service: General;  Laterality: N/A;   TONSILLECTOMY      Family History  Problem Relation Age of Onset   Hypertension Mother    Diabetes Mother    Hypertension Father     Asthma Brother    Heart murmur Brother    Crohn's disease Brother    Diabetes Maternal Aunt    Hypertension Maternal Aunt    Diabetes Maternal Uncle    Congestive Heart Failure Maternal Uncle    Cancer Paternal Aunt        uterine   Diabetes Maternal Grandmother    Cancer - Lung Maternal Grandmother    Diabetes Maternal Grandfather    Hypertension Maternal Grandfather    Stroke Maternal Grandfather    COPD Paternal Grandfather     Social History   Socioeconomic History   Marital status: Single    Spouse name: Not on file   Number of children: 1   Years of education: Not on file   Highest education level: 12th grade  Occupational History   Not on file  Tobacco Use   Smoking status: Former    Years: 3    Types: Cigarettes    Quit date: 2021    Years since quitting: 3.3   Smokeless tobacco: Never  Vaping Use   Vaping Use: Some days  Substance and Sexual Activity   Alcohol use: Yes    Comment: occasionally    Drug use: No   Sexual  activity: Yes    Birth control/protection: I.U.D.  Other Topics Concern   Not on file  Social History Narrative   Not on file   Social Determinants of Health   Financial Resource Strain: Low Risk  (03/01/2023)   Overall Financial Resource Strain (CARDIA)    Difficulty of Paying Living Expenses: Not hard at all  Food Insecurity: No Food Insecurity (03/01/2023)   Hunger Vital Sign    Worried About Running Out of Food in the Last Year: Never true    Ran Out of Food in the Last Year: Never true  Transportation Needs: No Transportation Needs (03/01/2023)   PRAPARE - Administrator, Civil Service (Medical): No    Lack of Transportation (Non-Medical): No  Physical Activity: Sufficiently Active (03/01/2023)   Exercise Vital Sign    Days of Exercise per Week: 5 days    Minutes of Exercise per Session: 30 min  Stress: Stress Concern Present (03/01/2023)   [employer] of Occupational Health - Occupational Stress Questionnaire     Feeling of Stress : Very much  Social Connections: Moderately Integrated (03/01/2023)   Social Connection and Isolation Panel [NHANES]    Frequency of Communication with Friends and Family: Twice a week    Frequency of Social Gatherings with Friends and Family: Once a week    Attends Religious Services: More than 4 times per year    Active Member of [employer] or Organizations: Yes    Attends Banker Meetings: More than 4 times per year    Marital Status: Never married  Intimate Partner Violence: Not At Risk (03/14/2022)   Humiliation, Afraid, Rape, and Kick questionnaire    Fear of Current or Ex-Partner: No    Emotionally Abused: No    Physically Abused: No    Sexually Abused: No    Outpatient Medications Prior to Visit  Medication Sig Dispense Refill   acetaminophen (TYLENOL) 500 MG tablet Take 1,000 mg by mouth as needed.     AIMOVIG 140 MG/ML SOAJ SMARTSIG:140 Milligram(s) SUB-Q Once a Month     amLODipine (NORVASC) 5 MG tablet Take 1 tablet by mouth daily.     baclofen (LIORESAL) 10 MG tablet Take 10 mg by mouth 3 (three) times daily as needed.     buPROPion (WELLBUTRIN XL) 150 MG 24 hr tablet Take 1 tablet (150 mg total) by mouth daily. 60 tablet 0   hydrOXYzine (ATARAX) 25 MG tablet Take 1 tablet (25 mg total) by mouth every 6 (six) hours. 90 tablet 0   JARDIANCE 10 MG TABS tablet Take 10 mg by mouth daily.     levonorgestrel (MIRENA) 20 MCG/24HR IUD 1 each by Intrauterine route once.     lisinopril-hydrochlorothiazide (ZESTORETIC) 20-25 MG tablet Take 1 tablet by mouth daily. 60 tablet 1   Semaglutide (RYBELSUS) 7 MG TABS Take 1 tablet (7 mg total) by mouth daily. 30 tablet 2   SUMAtriptan (IMITREX) 100 MG tablet Take 1 tablet by mouth as needed.     gabapentin (NEURONTIN) 300 MG capsule Take 1 capsule (300 mg total) by mouth 3 (three) times daily. 90 capsule 0   No facility-administered medications prior to visit.    No Known Allergies  ROS Review of Systems   Constitutional:  Negative for chills and fever.  Eyes:  Negative for visual disturbance.  Respiratory:  Negative for chest tightness and shortness of breath.   Neurological:  Negative for dizziness and headaches.      Objective:  Physical Exam HENT:     Head: Normocephalic.     Mouth/Throat:     Mouth: Mucous membranes are moist.  Cardiovascular:     Rate and Rhythm: Normal rate.     Heart sounds: Normal heart sounds.  Pulmonary:     Effort: Pulmonary effort is normal.     Breath sounds: Normal breath sounds.  Neurological:     Mental Status: She is alert.     BP (!) 140/88 (BP Location: Left Arm)   Pulse 84   Ht 5\' 7"  (1.702 m)   Wt 273 lb 0.6 oz (123.9 kg)   SpO2 94%   BMI 42.76 kg/m  Wt Readings from Last 3 Encounters:  03/14/23 273 lb 0.6 oz (123.9 kg)  03/01/23 278 lb (126.1 kg)  02/06/23 274 lb (124.3 kg)    Lab Results  Component Value Date   TSH 0.384 (L) 02/06/2023   Lab Results  Component Value Date   WBC 7.2 02/06/2023   HGB 13.7 02/06/2023   HCT 41.2 02/06/2023   MCV 91 02/06/2023   PLT 265 02/06/2023   Lab Results  Component Value Date   NA 138 02/06/2023   K 3.9 02/06/2023   CO2 21 02/06/2023   GLUCOSE 97 02/06/2023   BUN 10 02/06/2023   CREATININE 0.76 02/06/2023   BILITOT 0.3 02/06/2023   ALKPHOS 122 (H) 02/06/2023   AST 16 02/06/2023   ALT 16 02/06/2023   PROT 7.1 02/06/2023   ALBUMIN 4.3 02/06/2023   CALCIUM 9.4 02/06/2023   ANIONGAP 10 02/01/2023   EGFR 104 02/06/2023   Lab Results  Component Value Date   CHOL 157 02/06/2023   Lab Results  Component Value Date   HDL 50 02/06/2023   Lab Results  Component Value Date   LDLCALC 95 02/06/2023   Lab Results  Component Value Date   TRIG 58 02/06/2023   Lab Results  Component Value Date   CHOLHDL 3.1 02/06/2023   Lab Results  Component Value Date   HGBA1C 5.9 (H) 02/06/2023      Assessment & Plan:  MDD (major depressive disorder), recurrent episode,  moderate (HCC) Assessment & Plan: Symptoms unchanged She is following up with Dr. Adrian Blackwater on 03/15/2023 Reports occasionally having suicidal thoughts and ideation Patient is aware of suicide prevention line Will complete the forms and have it fax by 03/15/2023      Follow-up: Return in about 3 months (around 06/14/2023).   Gilmore Laroche, [name]

## 2023-03-14 NOTE — Assessment & Plan Note (Addendum)
Symptoms unchanged She is following up with Dr. Adrian Blackwater on 03/15/2023 Reports occasionally having suicidal thoughts and ideation Patient is aware of suicide prevention line Will complete the forms and have it fax by 03/15/2023

## 2023-03-14 NOTE — Patient Instructions (Addendum)
I appreciate the opportunity to provide care to you today!    Follow up:  3 months  Labs: next visit     Please continue to a heart-healthy diet and increase your physical activities. Try to exercise for 30mins at least five days a week.      It was a pleasure to see you and I look forward to continuing to work together on your health and well-being. Please do not hesitate to call the office if you need care or have questions about your care.   Have a wonderful day and week. With Gratitude, Tia Hieronymus MSN, [name]-BC  

## 2023-03-15 ENCOUNTER — Encounter (HOSPITAL_COMMUNITY): Payer: Self-pay | Admitting: Psychiatry

## 2023-03-15 ENCOUNTER — Ambulatory Visit (INDEPENDENT_AMBULATORY_CARE_PROVIDER_SITE_OTHER): Payer: Medicaid Other | Admitting: Psychiatry

## 2023-03-15 DIAGNOSIS — E559 Vitamin D deficiency, unspecified: Secondary | ICD-10-CM

## 2023-03-15 DIAGNOSIS — F411 Generalized anxiety disorder: Secondary | ICD-10-CM | POA: Diagnosis not present

## 2023-03-15 DIAGNOSIS — G4733 Obstructive sleep apnea (adult) (pediatric): Secondary | ICD-10-CM | POA: Diagnosis not present

## 2023-03-15 DIAGNOSIS — F4310 Post-traumatic stress disorder, unspecified: Secondary | ICD-10-CM

## 2023-03-15 DIAGNOSIS — G4709 Other insomnia: Secondary | ICD-10-CM | POA: Diagnosis not present

## 2023-03-15 DIAGNOSIS — F410 Panic disorder [episodic paroxysmal anxiety] without agoraphobia: Secondary | ICD-10-CM

## 2023-03-15 DIAGNOSIS — F633 Trichotillomania: Secondary | ICD-10-CM | POA: Diagnosis not present

## 2023-03-15 DIAGNOSIS — F332 Major depressive disorder, recurrent severe without psychotic features: Secondary | ICD-10-CM

## 2023-03-15 DIAGNOSIS — F17290 Nicotine dependence, other tobacco product, uncomplicated: Secondary | ICD-10-CM | POA: Diagnosis not present

## 2023-03-15 MED ORDER — LAMOTRIGINE 25 MG PO TABS: ORAL_TABLET | ORAL | 1 refills | Status: DC

## 2023-03-15 MED ORDER — DULOXETINE HCL 60 MG PO CPEP: 60.0000 mg | ORAL_CAPSULE | Freq: Every day | ORAL | 2 refills | Status: DC

## 2023-03-15 MED ORDER — HYDROXYZINE HCL 25 MG PO TABS: 25.0000 mg | ORAL_TABLET | Freq: Three times a day (TID) | ORAL | 1 refills | Status: DC | PRN

## 2023-03-15 NOTE — Patient Instructions (Addendum)
We discontinued the Wellbutrin today.  This should help with her appetite and ability to fall asleep at night.  In its place we started lamotrigine 25 mg take one pill nightly for two weeks then increase to two pills nightly for two weeks then increase to 3 pills nightly for two weeks.  We also talked about getting the restarted with psychotherapy so please call your previous therapist's office to get established.  In the meantime you can use this resource to start some of the work on your own: https://www.mosley.info/.pdf  We also discussed resources that you have available should the suicidal ideation come back.  You can call 988 or [facility]: [phone] 24 hours a day 7 days a week.
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
## 2023-03-16 ENCOUNTER — Ambulatory Visit: Payer: Medicaid Other | Admitting: Family Medicine

## 2023-04-19 ENCOUNTER — Encounter: Payer: Self-pay | Admitting: Family Medicine

## 2023-04-19 ENCOUNTER — Other Ambulatory Visit: Payer: Self-pay | Admitting: Family Medicine

## 2023-04-19 DIAGNOSIS — E1142 Type 2 diabetes mellitus with diabetic polyneuropathy: Secondary | ICD-10-CM

## 2023-04-19 MED ORDER — RYBELSUS 14 MG PO TABS: 14.0000 mg | ORAL_TABLET | Freq: Every day | ORAL | 3 refills | Status: DC

## 2023-04-27 NOTE — Telephone Encounter (Signed)
Scheduled appt with Arlys John

## 2023-04-28 ENCOUNTER — Encounter (HOSPITAL_COMMUNITY): Payer: Self-pay | Admitting: Psychiatry

## 2023-04-28 ENCOUNTER — Telehealth (INDEPENDENT_AMBULATORY_CARE_PROVIDER_SITE_OTHER): Payer: Medicaid Other | Admitting: Psychiatry

## 2023-04-28 ENCOUNTER — Other Ambulatory Visit: Payer: Self-pay | Admitting: Adult Health

## 2023-04-28 DIAGNOSIS — F332 Major depressive disorder, recurrent severe without psychotic features: Secondary | ICD-10-CM | POA: Diagnosis not present

## 2023-04-28 DIAGNOSIS — F410 Panic disorder [episodic paroxysmal anxiety] without agoraphobia: Secondary | ICD-10-CM | POA: Diagnosis not present

## 2023-04-28 DIAGNOSIS — G4709 Other insomnia: Secondary | ICD-10-CM

## 2023-04-28 DIAGNOSIS — F4310 Post-traumatic stress disorder, unspecified: Secondary | ICD-10-CM

## 2023-04-28 DIAGNOSIS — F411 Generalized anxiety disorder: Secondary | ICD-10-CM | POA: Diagnosis not present

## 2023-04-28 MED ORDER — LAMOTRIGINE 25 MG PO TABS: ORAL_TABLET | ORAL | 0 refills | Status: DC

## 2023-04-28 MED ORDER — HYDROXYZINE HCL 25 MG PO TABS: 25.0000 mg | ORAL_TABLET | Freq: Three times a day (TID) | ORAL | 1 refills | Status: DC | PRN

## 2023-04-28 MED ORDER — LAMOTRIGINE 100 MG PO TABS: 100.0000 mg | ORAL_TABLET | Freq: Every day | ORAL | 2 refills | Status: DC

## 2023-04-28 MED ORDER — DULOXETINE HCL 60 MG PO CPEP: 60.0000 mg | ORAL_CAPSULE | Freq: Every day | ORAL | 1 refills | Status: DC

## 2023-04-28 NOTE — Patient Instructions (Signed)
We restarted your Lamictal at 25 mg nightly today.  Take this for 2 weeks then increase to 50 mg nightly for 2 weeks and then increase to 75 mg nightly for 2 weeks.  I went on and called in the 100 mg dose which will be 1 pill nightly which she will start taking after completing 75 mg for 2 weeks.  I also called in a 90-day supply of your Cymbalta and refill for your hydroxyzine.
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
## 2023-05-05 ENCOUNTER — Ambulatory Visit: Payer: Medicaid Other | Admitting: Professional Counselor

## 2023-05-05 ENCOUNTER — Telehealth: Payer: Self-pay | Admitting: Family Medicine

## 2023-05-05 NOTE — Telephone Encounter (Signed)
Patient came by the office has not yet heard from any referral for behavorial patient phone  #[phone].

## 2023-05-23 ENCOUNTER — Other Ambulatory Visit: Payer: Self-pay | Admitting: Adult Health

## 2023-05-30 ENCOUNTER — Telehealth (HOSPITAL_COMMUNITY): Payer: Medicaid Other | Admitting: Psychiatry

## 2023-05-30 ENCOUNTER — Encounter (HOSPITAL_COMMUNITY): Payer: Self-pay | Admitting: Psychiatry

## 2023-05-30 DIAGNOSIS — F411 Generalized anxiety disorder: Secondary | ICD-10-CM | POA: Diagnosis not present

## 2023-05-30 DIAGNOSIS — E559 Vitamin D deficiency, unspecified: Secondary | ICD-10-CM

## 2023-05-30 DIAGNOSIS — F633 Trichotillomania: Secondary | ICD-10-CM | POA: Diagnosis not present

## 2023-05-30 DIAGNOSIS — F410 Panic disorder [episodic paroxysmal anxiety] without agoraphobia: Secondary | ICD-10-CM

## 2023-05-30 DIAGNOSIS — F332 Major depressive disorder, recurrent severe without psychotic features: Secondary | ICD-10-CM

## 2023-05-30 DIAGNOSIS — F17290 Nicotine dependence, other tobacco product, uncomplicated: Secondary | ICD-10-CM

## 2023-05-30 DIAGNOSIS — F4310 Post-traumatic stress disorder, unspecified: Secondary | ICD-10-CM | POA: Diagnosis not present

## 2023-05-30 DIAGNOSIS — G4709 Other insomnia: Secondary | ICD-10-CM | POA: Diagnosis not present

## 2023-05-30 MED ORDER — HYDROXYZINE HCL 25 MG PO TABS: 25.0000 mg | ORAL_TABLET | Freq: Three times a day (TID) | ORAL | 0 refills | Status: DC | PRN

## 2023-05-30 MED ORDER — LAMOTRIGINE 100 MG PO TABS: 50.0000 mg | ORAL_TABLET | Freq: Every day | ORAL | 1 refills | Status: DC
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
## 2023-05-30 NOTE — Patient Instructions (Signed)
We decreased hydroxyzine to 25mg  three times daily for panic/insomnia as needed today. This should be less sedating when taken at night. We will plan on keeping the lamictal at 50mg  nightly for now since it seems to be working and will keep cymbalta at 60mg  daily. Keep up the good work with taking care of yourself, it sounds like it is working!

## 2023-06-05 ENCOUNTER — Other Ambulatory Visit: Payer: Self-pay | Admitting: Adult Health

## 2023-06-06 ENCOUNTER — Other Ambulatory Visit (HOSPITAL_COMMUNITY)
Admission: RE | Admit: 2023-06-06 | Payer: Medicaid Other | Source: Ambulatory Visit | Admitting: Obstetrics & Gynecology

## 2023-06-06 ENCOUNTER — Other Ambulatory Visit (HOSPITAL_COMMUNITY)
Admission: RE | Admit: 2023-06-06 | Discharge: 2023-06-06 | Disposition: A | Discharge status: Home / Self Care | Payer: Medicaid Other | Source: Ambulatory Visit | Attending: Obstetrics & Gynecology | Admitting: Obstetrics & Gynecology

## 2023-06-06 ENCOUNTER — Ambulatory Visit (INDEPENDENT_AMBULATORY_CARE_PROVIDER_SITE_OTHER): Payer: Medicaid Other | Admitting: Obstetrics & Gynecology

## 2023-06-06 ENCOUNTER — Encounter: Payer: Self-pay | Admitting: Obstetrics & Gynecology

## 2023-06-06 VITALS — BP 151/95 | HR 82 | Ht 66.0 in | Wt 266.0 lb

## 2023-06-06 DIAGNOSIS — Z01419 Encounter for gynecological examination (general) (routine) without abnormal findings: Secondary | ICD-10-CM | POA: Diagnosis not present

## 2023-06-06 DIAGNOSIS — R87810 Cervical high risk human papillomavirus (HPV) DNA test positive: Secondary | ICD-10-CM | POA: Diagnosis not present

## 2023-06-06 DIAGNOSIS — Z1151 Encounter for screening for human papillomavirus (HPV): Secondary | ICD-10-CM | POA: Diagnosis not present

## 2023-06-06 DIAGNOSIS — R87610 Atypical squamous cells of undetermined significance on cytologic smear of cervix (ASC-US): Secondary | ICD-10-CM | POA: Insufficient documentation

## 2023-06-06 MED ORDER — TRIAMCINOLONE ACETONIDE 0.025 % EX OINT: 1.0000 | TOPICAL_OINTMENT | Freq: Two times a day (BID) | CUTANEOUS | 11 refills | Status: AC

## 2023-06-06 MED ORDER — VALACYCLOVIR HCL 500 MG PO TABS: 500.0000 mg | ORAL_TABLET | Freq: Every day | ORAL | 11 refills | Status: DC

## 2023-06-06 MED ORDER — FLUCONAZOLE 150 MG PO TABS: ORAL_TABLET | ORAL | 11 refills | Status: DC

## 2023-06-06 MED ORDER — NYSTATIN 100000 UNIT/GM EX CREA: 1.0000 | TOPICAL_CREAM | Freq: Two times a day (BID) | CUTANEOUS | 11 refills | Status: AC

## 2023-06-06 NOTE — Progress Notes (Signed)
Subjective:     [name] is a 37 y.o. female here for a routine exam.  Patient's last menstrual period was 05/20/2023. G1P1001 Birth Control Method:  Mirena IUD Menstrual Calendar(currently): starting to have a period again  Current complaints: none.   Current acute medical issues:  none   Recent Gynecologic History Patient's last menstrual period was 05/20/2023. 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    History of kidney stones    Hypertension    IUD (intrauterine device) in place    Migraines    Neuropathy    Obese    Papanicolaou smear of cervix with positive high risk human papilloma virus (HPV) test 03/22/2021   03/22/21 repeat pap in 1 year per ASCCP guideline, 5 year risk for CIN 3+ is 2.25 %   Polycystic ovarian syndrome    PTSD (post-traumatic stress disorder)    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   Sleep apnea    Trichomonas contact, treated    Type 2 diabetes mellitus (HCC)    Vitamin D deficiency    Yeast infection     Past Surgical History:  Procedure Laterality Date   CESAREAN SECTION  09/24/2012   Procedure: CESAREAN SECTION;  Surgeon: Willodean Rosenthal, MD;  Location: [facility];  Service: Gynecology;  Laterality: N/A;   CHOLECYSTECTOMY     CHOLECYSTECTOMY N/A 04/05/2013   Procedure: LAPAROSCOPIC CHOLECYSTECTOMY;  Surgeon: Dalia Heading, MD;  Location: [facility];  Service: General;  Laterality: N/A;   TONSILLECTOMY      OB History     Gravida  1   Para  1   Term  1    Preterm  0   AB  0   Living  1      SAB  0   IAB  0   Ectopic  0   Multiple  0   Live Births  1           Social History   Socioeconomic History   Marital status: Single    Spouse name: Not on file   Number of children: 1   Years of education: Not on file   Highest education level: 12th grade  Occupational History   Not on file  Tobacco Use   Smoking status: Every Day    Current packs/day: 0.00    Types: Cigarettes, E-cigarettes    Start date: 2018    Last attempt to quit: 2021    Years since quitting: 3.5   Smokeless tobacco: Never   Tobacco comments:    Vape cartridge lasts about a month at a time  Vaping Use   Vaping status: Some Days  Substance and Sexual Activity   Alcohol use: Yes    Comment: Infrequent lately 1 unit of alcohol at the time   Drug use: No   Sexual activity: Yes    Birth control/protection: I.U.D.  Other Topics Concern   Not on file  Social History Narrative   Not on file   Social Determinants of Health   Financial Resource Strain: Patient Declined (06/06/2023)   Overall Financial Resource Strain (CARDIA)    Difficulty of Paying Living Expenses: Patient declined  Food Insecurity: No Food Insecurity (06/06/2023)   Hunger Vital Sign    Worried About Running Out of Food in the Last Year: Never true    Ran Out of Food in the Last Year: Never true  Transportation Needs: No Transportation Needs (06/06/2023)   PRAPARE - Administrator, Civil Service (Medical): No    Lack of Transportation (Non-Medical): No  Physical Activity: Sufficiently Active (06/06/2023)   Exercise Vital Sign    Days of Exercise per Week: 7 days    Minutes of Exercise per Session: 30 min  Stress: Stress Concern Present (06/06/2023)   [employer] of Occupational Health - Occupational Stress Questionnaire    Feeling of Stress : To some extent  Social Connections: Moderately Integrated (06/06/2023)   Social Connection and Isolation Panel [NHANES]     Frequency of Communication with Friends and Family: More than three times a week    Frequency of Social Gatherings with Friends and Family: Once a week    Attends Religious Services: More than 4 times per year    Active Member of [employer] or Organizations: Yes    Attends Banker Meetings: 1 to 4 times per year    Marital Status: Never married    Family History  Problem Relation Age of Onset   Hypertension Mother    Diabetes Mother    Hypertension Father    Asthma Brother    Heart murmur Brother    Crohn's disease Brother    Diabetes Maternal Aunt    Hypertension Maternal Aunt    Diabetes Maternal Uncle    Congestive Heart Failure Maternal Uncle    Cancer Paternal Aunt        uterine   Diabetes Maternal Grandmother    Cancer - Lung Maternal Grandmother    Diabetes Maternal Grandfather    Hypertension Maternal Grandfather    Stroke Maternal Grandfather    COPD Paternal Grandfather      Current Outpatient Medications:    acetaminophen (TYLENOL) 500 MG tablet, Take 1,000 mg by mouth as needed., Disp: , Rfl:    AIMOVIG 140 MG/ML SOAJ, SMARTSIG:140 Milligram(s) SUB-Q Once a Month, Disp: , Rfl:    amLODipine (NORVASC) 5 MG tablet, Take 1 tablet by mouth daily., Disp: , Rfl:    baclofen (LIORESAL) 10 MG tablet, Take 10 mg by mouth 3 (three) times daily as needed., Disp: , Rfl:    DULoxetine (CYMBALTA) 60 MG capsule, Take 1 capsule (60 mg total) by mouth daily., Disp: 90 capsule, Rfl: 1   hydrOXYzine (ATARAX) 25 MG tablet, Take 1 tablet (25 mg total) by mouth every 8 (eight) hours as needed for anxiety (Insomnia). (Patient taking differently: Take 100 mg by mouth every 8 (eight) hours as needed for anxiety (Insomnia).), Disp: 90 tablet, Rfl: 0   JARDIANCE 10 MG TABS tablet, Take 10 mg by mouth daily., Disp: , Rfl:    lamoTRIgine (LAMICTAL) 100 MG tablet, Take 0.5 tablets (50 mg total) by mouth daily., Disp: 45 tablet, Rfl: 1   levonorgestrel (MIRENA) 20 MCG/24HR IUD, 1  each by Intrauterine route once., Disp: , Rfl:    lisinopril-hydrochlorothiazide (ZESTORETIC) 20-25 MG tablet, Take 1 tablet by mouth daily., Disp: 60 tablet, Rfl: 1   nystatin cream (MYCOSTATIN),  Apply 1 Application topically 2 (two) times daily., Disp: 30 g, Rfl: 11   Semaglutide (RYBELSUS) 14 MG TABS, Take 1 tablet (14 mg total) by mouth daily., Disp: 30 tablet, Rfl: 3   SUMAtriptan (IMITREX) 100 MG tablet, Take 1 tablet by mouth as needed., Disp: , Rfl:    triamcinolone (KENALOG) 0.025 % ointment, Apply 1 Application topically 2 (two) times daily., Disp: 30 g, Rfl: 11   fluconazole (DIFLUCAN) 150 MG tablet, TAKE ONE TABLET BY MOUTH NOW THEN CAN REPEAT IN 3 DAYS, Disp: 2 tablet, Rfl: 11   gabapentin (NEURONTIN) 300 MG capsule, Take 1 capsule (300 mg total) by mouth 3 (three) times daily., Disp: 90 capsule, Rfl: 0   valACYclovir (VALTREX) 500 MG tablet, Take 1 tablet (500 mg total) by mouth daily., Disp: 30 tablet, Rfl: 11  Review of Systems  Review of Systems  Constitutional: Negative for fever, chills, weight loss, malaise/fatigue and diaphoresis.  HENT: Negative for hearing loss, ear pain, nosebleeds, congestion, sore throat, neck pain, tinnitus and ear discharge.   Eyes: Negative for blurred vision, double vision, photophobia, pain, discharge and redness.  Respiratory: Negative for cough, hemoptysis, sputum production, shortness of breath, wheezing and stridor.   Cardiovascular: Negative for chest pain, palpitations, orthopnea, claudication, leg swelling and PND.  Gastrointestinal: negative for abdominal pain. Negative for heartburn, nausea, vomiting, diarrhea, constipation, blood in stool and melena.  Genitourinary: Negative for dysuria, urgency, frequency, hematuria and flank pain.  Musculoskeletal: Negative for myalgias, back pain, joint pain and falls.  Skin: Negative for itching and rash.  Neurological: Negative for dizziness, tingling, tremors, sensory change, speech change, focal  weakness, seizures, loss of consciousness, weakness and headaches.  Endo/Heme/Allergies: Negative for environmental allergies and polydipsia. Does not bruise/bleed easily.  Psychiatric/Behavioral: Negative for depression, suicidal ideas, hallucinations, memory loss and substance abuse. The patient is not nervous/anxious and does not have insomnia.        Objective:  Blood pressure (!) 165/106, pulse 82, height 5\' 6"  (1.676 m), weight 266 lb (120.7 kg), last menstrual period 05/20/2023.   Physical Exam  Vitals reviewed. Constitutional: She is oriented to person, place, and time. She appears well-developed and well-nourished.  HENT:  Head: Normocephalic and atraumatic.        Right Ear: External ear normal.  Left Ear: External ear normal.  Nose: Nose normal.  Mouth/Throat: Oropharynx is clear and moist.  Eyes: Conjunctivae and EOM are normal. Pupils are equal, round, and reactive to light. Right eye exhibits no discharge. Left eye exhibits no discharge. No scleral icterus.  Neck: Normal range of motion. Neck supple. No tracheal deviation present. No thyromegaly present.  Cardiovascular: Normal rate, regular rhythm, normal heart sounds and intact distal pulses.  Exam reveals no gallop and no friction rub.   No murmur heard. Respiratory: Effort normal and breath sounds normal. No respiratory distress. She has no wheezes. She has no rales. She exhibits no tenderness.  GI: Soft. Bowel sounds are normal. She exhibits no distension and no mass. There is no tenderness. There is no rebound and no guarding.  Genitourinary:  Breasts no masses skin changes or nipple changes bilaterally      Vulva is normal without lesions Vagina is pink moist without discharge Cervix normal in appearance and pap is done Uterus is normal size shape and contour Adnexa is negative with normal sized ovaries   Musculoskeletal: Normal range of motion. She exhibits no edema and no tenderness.  Neurological: She is alert  and oriented to person,  place, and time. She has normal reflexes. She displays normal reflexes. No cranial nerve deficit. She exhibits normal muscle tone. Coordination normal.  Skin: Skin is warm and dry. No rash noted. No erythema. No pallor.  Psychiatric: She has a normal mood and affect. Her behavior is normal. Judgment and thought content normal.       Medications Ordered at today's visit: Meds ordered this encounter  Medications   valACYclovir (VALTREX) 500 MG tablet    Sig: Take 1 tablet (500 mg total) by mouth daily.    Dispense:  30 tablet    Refill:  11   fluconazole (DIFLUCAN) 150 MG tablet    Sig: TAKE ONE TABLET BY MOUTH NOW THEN CAN REPEAT IN 3 DAYS    Dispense:  2 tablet    Refill:  11   nystatin cream (MYCOSTATIN)    Sig: Apply 1 Application topically 2 (two) times daily.    Dispense:  30 g    Refill:  11   triamcinolone (KENALOG) 0.025 % ointment    Sig: Apply 1 Application topically 2 (two) times daily.    Dispense:  30 g    Refill:  11    Other orders placed at today's visit: No orders of the defined types were placed in this encounter.     Assessment:    Normal Gyn exam.    Plan:    Follow up in: 3 years.   Switch out IUD when pt wants to as bleeding is starting again  No follow-ups on file.

## 2023-06-14 ENCOUNTER — Encounter: Payer: Self-pay | Admitting: Family Medicine

## 2023-06-14 ENCOUNTER — Telehealth (INDEPENDENT_AMBULATORY_CARE_PROVIDER_SITE_OTHER): Payer: Medicaid Other | Admitting: Family Medicine

## 2023-06-14 DIAGNOSIS — G629 Polyneuropathy, unspecified: Secondary | ICD-10-CM | POA: Diagnosis not present

## 2023-06-14 DIAGNOSIS — G43E09 Chronic migraine with aura, not intractable, without status migrainosus: Secondary | ICD-10-CM

## 2023-06-14 MED ORDER — SUMATRIPTAN SUCCINATE 100 MG PO TABS
100.0000 mg | ORAL_TABLET | ORAL | 0 refills | Status: DC | PRN
Start: 2023-06-14 — End: 2024-05-14

## 2023-06-14 NOTE — Progress Notes (Signed)
Virtual Visit via Video Note  I connected with [name] on 06/14/23 at 10:00 AM EDT by a video enabled telemedicine application and verified that I am speaking with the correct person using two identifiers.  Patient Location: Home Provider Location: Office/Clinic  I discussed the limitations, risks, security, and privacy concerns of performing an evaluation and management service by video and the availability of in person appointments. I also discussed with the patient that there may be a patient responsible charge related to this service. The patient expressed understanding and agreed to proceed.  Subjective: PCP: Gilmore Laroche, [name]  Chief Complaint  Patient presents with   Care Management    3 month f/u. Been having swelling in her feet with the neuropathy. Would like a referral to a new neurologist was seeing someone in [place] that is leaving the practice.    HPI Chronic migraines: She was following up with neurology at [facility] and was seeing Dr. Adriana Simas who has left the practice.  She would like a referral today to neurology within the [facility] system preferably refill.  Reports that her migraine headaches usually last about 3 days.  She was on baclofen 10 mg to take 3 times daily as needed, Phenergan 25 mg as needed for headache and nausea, sumatriptan 100 mg for abortive treatment and Aimovig 140 mg sq monthly.  Resistant hypertension: She reports that her blood pressure has been elevated noting ambulatory readings in the 170 systolic and 125 diastolic.  She is currently asymptomatic,A referral was placed to the hypertensive clinic however the patient lost her insurance and has not follow-up.  Neuropathy: She takes gabapentin 300 mg daily.  Complains of bilateral lower extremity swelling.  No redness, warmth, or claudication reported.  She c/o cool extremities.  ROS: Per HPI  Current Outpatient Medications:    acetaminophen (TYLENOL)  500 MG tablet, Take 1,000 mg by mouth as needed., Disp: , Rfl:    AIMOVIG 140 MG/ML SOAJ, SMARTSIG:140 Milligram(s) SUB-Q Once a Month, Disp: , Rfl:    amLODipine (NORVASC) 5 MG tablet, Take 1 tablet by mouth daily., Disp: , Rfl:    baclofen (LIORESAL) 10 MG tablet, Take 10 mg by mouth 3 (three) times daily as needed., Disp: , Rfl:    DULoxetine (CYMBALTA) 60 MG capsule, Take 1 capsule (60 mg total) by mouth daily., Disp: 90 capsule, Rfl: 1   fluconazole (DIFLUCAN) 150 MG tablet, TAKE ONE TABLET BY MOUTH NOW THEN CAN REPEAT IN 3 DAYS, Disp: 2 tablet, Rfl: 11   hydrOXYzine (ATARAX) 25 MG tablet, Take 1 tablet (25 mg total) by mouth every 8 (eight) hours as needed for anxiety (Insomnia). (Patient taking differently: Take 100 mg by mouth every 8 (eight) hours as needed for anxiety (Insomnia).), Disp: 90 tablet, Rfl: 0   JARDIANCE 10 MG TABS tablet, Take 10 mg by mouth daily., Disp: , Rfl:    lamoTRIgine (LAMICTAL) 100 MG tablet, Take 0.5 tablets (50 mg total) by mouth daily., Disp: 45 tablet, Rfl: 1   levonorgestrel (MIRENA) 20 MCG/24HR IUD, 1 each by Intrauterine route once., Disp: , Rfl:    lisinopril-hydrochlorothiazide (ZESTORETIC) 20-25 MG tablet, Take 1 tablet by mouth daily., Disp: 60 tablet, Rfl: 1   nystatin cream (MYCOSTATIN), Apply 1 Application topically 2 (two) times daily., Disp: 30 g, Rfl: 11   Semaglutide (RYBELSUS) 14 MG TABS, Take 1 tablet (14 mg total) by mouth daily., Disp: 30 tablet, Rfl: 3   triamcinolone (KENALOG) 0.025 %  ointment, Apply 1 Application topically 2 (two) times daily., Disp: 30 g, Rfl: 11   valACYclovir (VALTREX) 500 MG tablet, Take 1 tablet (500 mg total) by mouth daily., Disp: 30 tablet, Rfl: 11   gabapentin (NEURONTIN) 300 MG capsule, Take 1 capsule (300 mg total) by mouth 3 (three) times daily., Disp: 90 capsule, Rfl: 0   SUMAtriptan (IMITREX) 100 MG tablet, Take 1 tablet (100 mg total) by mouth as needed., Disp: 10 tablet, Rfl:  0  Observations/Objective: There were no vitals filed for this visit. Physical Exam  Assessment and Plan: Neuropathy -     Ambulatory referral to Vascular Surgery  Chronic migraine with aura without status migrainosus, not intractable -     Ambulatory referral to Neurology -     SUMAtriptan Succinate; Take 1 tablet (100 mg total) by mouth as needed.  Dispense: 10 tablet; Refill: 0    See Referral placed to neurology for chronic migraines Referral placed to vascular surgery Encouraged daily elevation of 20-30 minutes above level of heart, daily compression stocking use, exercise, weight reduction, refraining from prolonged sitting or standing.   Encouraged to follow-up with the hypertensive clinic for management of resistant hypertension Will follow-up in a month Encouraged low-sodium diet and increase physical activity Encouraged to report to the ED for BP greater than 180/120 with symptoms of headache, dizziness, chest pain, palpitation, shortness of breath.  Follow Up Instructions: Return in about 1 month (around 07/15/2023).   I discussed the assessment and treatment plan with the patient. The patient was provided an opportunity to ask questions, and all were answered. The patient agreed with the plan and demonstrated an understanding of the instructions.   The patient was advised to call back or seek an in-person evaluation if the symptoms worsen or if the condition fails to improve as anticipated.  The above assessment and management plan was discussed with the patient. The patient verbalized understanding of and has agreed to the management plan.   Gilmore Laroche, [name]

## 2023-06-14 NOTE — Patient Instructions (Signed)
I appreciate the opportunity to provide care to you today!    Follow up:  1 months   Please continue to a heart-healthy diet and increase your physical activities. Try to exercise for at least five days a week.    It was a pleasure to see you and I look forward to continuing to work together on your health and well-being. Please do not hesitate to call the office if you need care or have questions about your care.  In case of emergency, please visit the Emergency Department for urgent care, or contact our clinic at [phone] to schedule an appointment. We're here to help you!   Have a wonderful day and week. With Gratitude, Gilmore Laroche MSN, [name]-BC'

## 2023-06-15 ENCOUNTER — Encounter: Payer: Self-pay | Admitting: Emergency Medicine

## 2023-06-15 ENCOUNTER — Ambulatory Visit: Payer: Medicaid Other

## 2023-06-15 ENCOUNTER — Ambulatory Visit
Admission: EM | Admit: 2023-06-15 | Discharge: 2023-06-15 | Disposition: A | Discharge status: Home / Self Care | Payer: Medicaid Other | Attending: Nurse Practitioner | Admitting: Nurse Practitioner

## 2023-06-15 DIAGNOSIS — S8392XA Sprain of unspecified site of left knee, initial encounter: Secondary | ICD-10-CM | POA: Diagnosis not present

## 2023-06-15 DIAGNOSIS — M25562 Pain in left knee: Secondary | ICD-10-CM

## 2023-06-15 DIAGNOSIS — M1712 Unilateral primary osteoarthritis, left knee: Secondary | ICD-10-CM | POA: Diagnosis not present

## 2023-06-15 MED ORDER — IBUPROFEN 800 MG PO TABS: 800.0000 mg | ORAL_TABLET | Freq: Three times a day (TID) | ORAL | 0 refills | Status: DC

## 2023-06-15 NOTE — ED Provider Notes (Signed)
[facility]    CSN: [id number] Arrival date & time: 06/15/23  1252      History   Chief Complaint No chief complaint on file.   HPI [name] is a 37 y.o. female.   The history is provided by the patient.   The patient presents for complaints of left knee pain.  Patient states she was standing in front of her refrigerator last evening.  She states her feet were planted and when she went to turn, she felt a sudden pain in the right knee.  She states that she could not pick her leg up to move immediately afterwards.  Since that time, she has had worsening pain in the left knee, swelling, and inability to bear weight.  Patient denies numbness, tingling, or radiation of pain.  Patient states she does have pain behind the knee as well.  Patient states that she has been seen in the past for symptoms of her right knee.  She reports that she took ibuprofen last evening and applied a warm cough to the left knee.  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    History of kidney stones    Hypertension    IUD (intrauterine device) in place    Migraines    Neuropathy    Obese    Papanicolaou smear of cervix with positive high risk human papilloma virus (HPV) test 03/22/2021   03/22/21 repeat pap in 1 year per ASCCP guideline, 5 year risk for CIN 3+ is 2.25 %   Polycystic ovarian syndrome    PTSD (post-traumatic stress disorder)    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   Sleep apnea    Trichomonas contact, treated    Type 2 diabetes mellitus (HCC)     Vitamin D deficiency    Yeast infection     Patient Active Problem List   Diagnosis Date Noted   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   Vaping nicotine dependence, tobacco product 03/15/2023   Type 2 diabetes mellitus with diabetic polyneuropathy, without long-term current use of insulin (HCC) 02/06/2023   ASCUS of cervix with negative high risk HPV 03/17/2022   Screen for STD (sexually transmitted disease) 03/14/2022   Herpes 12/15/2021   Insomnia rule out bipolar 2 disorder 04/07/2021   Papanicolaou smear of cervix with positive high risk human papilloma virus (HPV) test 03/22/2021   Routine general medical examination at a health care facility 03/12/2021   Boil of groin 02/23/2021   IUD (intrauterine device) in place 02/09/2021   Epidermal cyst of vulva 02/09/2021   Mixed obsessional thoughts and acts 11/30/2020   PTSD (post-traumatic stress disorder) 11/30/2020   Panic disorder 11/30/2020   GAD (generalized anxiety disorder) 11/30/2020   Chronic migraine 01/17/2020   Morbid obesity (HCC) 08/21/2019   Vitamin D deficiency 03/09/2017   Elevated hemoglobin A1c 03/09/2017   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   Hematuria 12/22/2016   Major depressive disorder, 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   Encounter for insertion of mirena  IUD 03/01/2013   Sleep apnea 02/28/2013   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    Past Surgical History:  Procedure Laterality Date   CESAREAN SECTION  09/24/2012   Procedure: CESAREAN SECTION;  Surgeon: Willodean Rosenthal, MD;  Location: [facility];  Service: Gynecology;  Laterality: N/A;   CHOLECYSTECTOMY     CHOLECYSTECTOMY N/A 04/05/2013   Procedure: LAPAROSCOPIC CHOLECYSTECTOMY;  Surgeon: Dalia Heading, MD;  Location: [facility];  Service: General;  Laterality: N/A;   TONSILLECTOMY      OB History     Gravida  1   Para  1   Term  1   Preterm  0   AB  0   Living  1      SAB  0   IAB  0   Ectopic  0    Multiple  0   Live Births  1            Home Medications    Prior to Admission medications   Medication Sig Start Date End Date Taking? Authorizing Provider  acetaminophen (TYLENOL) 500 MG tablet Take 1,000 mg by mouth as needed.    [provider]  AIMOVIG 140 MG/ML SOAJ SMARTSIG:140 Milligram(s) SUB-Q Once a Month 02/01/23   [provider]  amLODipine (NORVASC) 5 MG tablet Take 1 tablet by mouth daily. 10/09/21   [provider]  baclofen (LIORESAL) 10 MG tablet Take 10 mg by mouth 3 (three) times daily as needed. 11/10/20   [provider]  DULoxetine (CYMBALTA) 60 MG capsule Take 1 capsule (60 mg total) by mouth daily. 04/28/23   Elsie Lincoln, MD  fluconazole (DIFLUCAN) 150 MG tablet TAKE ONE TABLET BY MOUTH NOW THEN CAN REPEAT IN 3 DAYS 06/06/23   Lazaro Arms, MD  gabapentin (NEURONTIN) 300 MG capsule Take 1 capsule (300 mg total) by mouth 3 (three) times daily. 04/28/21 02/06/23  Moshe Cipro, NP  hydrOXYzine (ATARAX) 25 MG tablet Take 1 tablet (25 mg total) by mouth every 8 (eight) hours as needed for anxiety (Insomnia). Patient taking differently: Take 100 mg by mouth every 8 (eight) hours as needed for anxiety (Insomnia). 05/30/23   Elsie Lincoln, MD  JARDIANCE 10 MG TABS tablet Take 10 mg by mouth daily. 02/02/21   [provider]  lamoTRIgine (LAMICTAL) 100 MG tablet Take 0.5 tablets (50 mg total) by mouth daily. 06/05/23 12/02/23  Elsie Lincoln, MD  levonorgestrel (MIRENA) 20 MCG/24HR IUD 1 each by Intrauterine route once.    [provider]  lisinopril-hydrochlorothiazide (ZESTORETIC) 20-25 MG tablet Take 1 tablet by mouth daily. 02/06/23   Gardenia Phlegm, MD  nystatin cream (MYCOSTATIN) Apply 1 Application topically 2 (two) times daily. 06/06/23   Lazaro Arms, MD  Semaglutide (RYBELSUS) 14 MG TABS Take 1 tablet (14 mg total) by mouth daily. 04/19/23   Gilmore Laroche, [name]  SUMAtriptan (IMITREX) 100 MG tablet  Take 1 tablet (100 mg total) by mouth as needed. 06/14/23   Gilmore Laroche, [name]  triamcinolone (KENALOG) 0.025 % ointment Apply 1 Application topically 2 (two) times daily. 06/06/23   Lazaro Arms, MD  valACYclovir (VALTREX) 500 MG tablet Take 1 tablet (500 mg total) by mouth daily. 06/06/23   Lazaro Arms, MD    Family History Family History  Problem Relation Age of Onset   Hypertension Mother    Diabetes Mother    Hypertension Father    Asthma Brother    Heart murmur Brother  Crohn's disease Brother    Diabetes Maternal Aunt    Hypertension Maternal Aunt    Diabetes Maternal Uncle    Congestive Heart Failure Maternal Uncle    Cancer Paternal Aunt        uterine   Diabetes Maternal Grandmother    Cancer - Lung Maternal Grandmother    Diabetes Maternal Grandfather    Hypertension Maternal Grandfather    Stroke Maternal Grandfather    COPD Paternal Grandfather     Social History Social History   Tobacco Use   Smoking status: Every Day    Current packs/day: 0.00    Types: Cigarettes, E-cigarettes    Start date: 2018    Last attempt to quit: 2021    Years since quitting: 3.6   Smokeless tobacco: Never   Tobacco comments:    Vape cartridge lasts about a month at a time  Vaping Use   Vaping status: Some Days  Substance Use Topics   Alcohol use: Yes    Comment: Infrequent lately 1 unit of alcohol at the time   Drug use: No     Allergies   Patient has no known allergies.   Review of Systems Review of Systems Per HPI  Physical Exam Triage Vital Signs ED Triage Vitals  Encounter Vitals Group     BP 06/15/23 1322 (!) 158/106     Systolic BP Percentile --      Diastolic BP Percentile --      Pulse Rate 06/15/23 1322 99     Resp 06/15/23 1322 18     Temp 06/15/23 1322 98.2 F (36.8 C)     Temp src --      SpO2 06/15/23 1322 96 %     Weight --      Height --      Head Circumference --      Peak Flow --      Pain Score 06/15/23 1321 10     Pain Loc --       Pain Education --      Exclude from Growth Chart --    No data found.  Updated Vital Signs BP (!) 158/106 (BP Location: Right Arm)   Pulse 99   Temp 98.2 F (36.8 C)   Resp 18   LMP 05/20/2023   SpO2 96%   Visual Acuity Right Eye Distance:   Left Eye Distance:   Bilateral Distance:    Right Eye Near:   Left Eye Near:    Bilateral Near:     Physical Exam Vitals and nursing note reviewed.  Constitutional:      General: She is not in acute distress.    Appearance: Normal appearance.  HENT:     Head: Normocephalic.  Eyes:     Extraocular Movements: Extraocular movements intact.     Pupils: Pupils are equal, round, and reactive to light.  Pulmonary:     Effort: Pulmonary effort is normal.  Musculoskeletal:     Cervical back: Normal range of motion.     Left knee: No swelling, deformity, effusion or erythema. Decreased range of motion. Tenderness present over the medial joint line, lateral joint line, LCL, ACL and PCL. Normal pulse.  Skin:    General: Skin is warm and dry.  Neurological:     General: No focal deficit present.     Mental Status: She is alert and oriented to person, place, and time.  Psychiatric:        Mood and Affect:  Mood normal.        Behavior: Behavior normal.      UC Treatments / Results  Labs (all labs ordered are listed, but only abnormal results are displayed) Labs Reviewed - No data to display  EKG   Radiology DG Knee Complete 4 Views Left  Result Date: 06/15/2023 CLINICAL DATA:  felt pain while twisting, states unable to bear weight on left knee. EXAM: LEFT KNEE - COMPLETE 4+ VIEW COMPARISON:  Left knee radiographs 05/16/2018. FINDINGS: Four views of the left knee. No acute fracture or dislocation of the left knee. Mild marginal osteophyte formation along the medial tibiofemoral and patellofemoral compartments, unchanged. No left knee joint effusion. IMPRESSION: 1. No acute fracture, dislocation or joint effusion of the left knee.  2. Unchanged mild left knee osteoarthritis. Electronically Signed   By: Orvan Falconer M.D.   On: 06/15/2023 13:41    Procedures Procedures (including critical care time)  Medications Ordered in UC Medications - No data to display  Initial Impression / Assessment and Plan / UC Course  I have reviewed the triage vital signs and the nursing notes.  Pertinent labs & imaging results that were available during my care of the patient were reviewed by me and considered in my medical decision making (see chart for details).  The patient is well-appearing, she is in no acute distress, vital signs are stable.  X-ray of the left knee is negative for fracture or dislocation.  X-ray does show mild left knee osteoarthritis.  Symptoms appear to be consistent with a sprain of the left knee, most likely exacerbated by the osteoarthritis.  Patient was provided a hinged knee brace to provide compression and support, along with crutches to help with weightbearing.  Ibuprofen 800 mg was prescribed for pain.  Supportive care recommendations were provided and discussed with the patient to include RICE therapy, gentle range of motion exercises, and beginning to ambulate to help improve mobility of the left knee joint.  Patient was advised to follow-up with orthopedics if symptoms fail to improve.  Patient was in agreement with this plan of care and verbalizes understanding.  All questions were answered.  Patient stable for discharge.   Final Clinical Impressions(s) / UC Diagnoses   Final diagnoses:  Sprain of left knee, unspecified ligament, initial encounter     Discharge Instructions      X-ray of the left knee is negative for fracture or dislocation.  The x-ray does show mild osteoarthritis however. You have been provided a knee brace to provide compression and support.  Also use the crutches to help you begin ambulating on the left knee. Take medication as prescribed. RICE therapy, rest, ice,  compression, and elevation.  Apply ice for 20 minutes, remove for 1 hour, repeat is much as possible. As discussed, if symptoms are not improving over the next 1 to 2 weeks, recommend following up with orthopedics for further evaluation.  You can follow-up with [facility], or with [facility]. Follow-up as needed.     ED Prescriptions   None    PDMP not reviewed this encounter.   Abran Cantor, NP 06/15/23 1402

## 2023-06-15 NOTE — Discharge Instructions (Signed)
X-ray of the left knee is negative for fracture or dislocation.  The x-ray does show mild osteoarthritis however. You have been provided a knee brace to provide compression and support.  Also use the crutches to help you begin ambulating on the left knee. Take medication as prescribed. RICE therapy, rest, ice, compression, and elevation.  Apply ice for 20 minutes, remove for 1 hour, repeat is much as possible. As discussed, if symptoms are not improving over the next 1 to 2 weeks, recommend following up with orthopedics for further evaluation.  You can follow-up with [facility], or with [facility]. Follow-up as needed.

## 2023-06-15 NOTE — ED Triage Notes (Signed)
Left knee pain since last night.  Felt pain while turning.  States she is unable to bear weight on that knee.

## 2023-06-23 ENCOUNTER — Encounter: Payer: Self-pay | Admitting: Neurology

## 2023-06-29 ENCOUNTER — Encounter (HOSPITAL_COMMUNITY): Payer: Self-pay | Admitting: Psychiatry

## 2023-06-29 ENCOUNTER — Telehealth (INDEPENDENT_AMBULATORY_CARE_PROVIDER_SITE_OTHER): Payer: Medicaid Other | Admitting: Psychiatry

## 2023-06-29 DIAGNOSIS — G4709 Other insomnia: Secondary | ICD-10-CM | POA: Diagnosis not present

## 2023-06-29 DIAGNOSIS — F332 Major depressive disorder, recurrent severe without psychotic features: Secondary | ICD-10-CM

## 2023-06-29 DIAGNOSIS — E559 Vitamin D deficiency, unspecified: Secondary | ICD-10-CM

## 2023-06-29 DIAGNOSIS — F411 Generalized anxiety disorder: Secondary | ICD-10-CM

## 2023-06-29 DIAGNOSIS — F410 Panic disorder [episodic paroxysmal anxiety] without agoraphobia: Secondary | ICD-10-CM

## 2023-06-29 DIAGNOSIS — F4310 Post-traumatic stress disorder, unspecified: Secondary | ICD-10-CM | POA: Diagnosis not present

## 2023-06-29 MED ORDER — DULOXETINE HCL 30 MG PO CPEP
30.0000 mg | ORAL_CAPSULE | Freq: Every day | ORAL | 2 refills | Status: DC
Start: 2023-06-29 — End: 2023-08-11

## 2023-06-29 MED ORDER — DULOXETINE HCL 60 MG PO CPEP
60.0000 mg | ORAL_CAPSULE | Freq: Every day | ORAL | 2 refills | Status: DC
Start: 2023-06-29 — End: 2023-08-11

## 2023-06-29 MED ORDER — HYDROXYZINE HCL 50 MG PO TABS
25.0000 mg | ORAL_TABLET | Freq: Three times a day (TID) | ORAL | 2 refills | Status: DC | PRN
Start: 2023-06-29 — End: 2023-08-11

## 2023-06-29 MED ORDER — LAMOTRIGINE 100 MG PO TABS: 50.0000 mg | ORAL_TABLET | Freq: Every day | ORAL | 1 refills | Status: DC

## 2023-06-29 NOTE — Patient Instructions (Signed)
We increased the cymbalta (duloxetine) to 90mg  once daily. You will get to that dose with one of the 60mg  capsules taken with one of the 30mg  capsules. We otherwise kept your lamotrigine and hydroxyzine the same. Please let your PCP know about the struggles with eating and they may want to check a folate, b12, and iron panel. Keep taking the vitamin d and do your best to remain consistent with meals and that should help your mood a lot.
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
## 2023-06-30 ENCOUNTER — Telehealth (HOSPITAL_COMMUNITY): Payer: Medicaid Other | Admitting: Psychiatry

## 2023-07-03 ENCOUNTER — Ambulatory Visit (INDEPENDENT_AMBULATORY_CARE_PROVIDER_SITE_OTHER): Payer: Medicaid Other | Admitting: Obstetrics & Gynecology

## 2023-07-03 ENCOUNTER — Encounter: Payer: Self-pay | Admitting: Obstetrics & Gynecology

## 2023-07-03 VITALS — BP 176/115 | HR 76 | Ht 66.0 in

## 2023-07-03 DIAGNOSIS — R87810 Cervical high risk human papillomavirus (HPV) DNA test positive: Secondary | ICD-10-CM | POA: Diagnosis not present

## 2023-07-03 DIAGNOSIS — Z3202 Encounter for pregnancy test, result negative: Secondary | ICD-10-CM

## 2023-07-03 LAB — POCT URINE PREGNANCY: Preg Test, Ur: NEGATIVE

## 2023-07-03 NOTE — Progress Notes (Signed)
    Colposcopy Procedure Note:    Colposcopy Procedure Note  Indications:  2024  ASCUS + HPV 2023 ASCUS neg HPV    2019 ASCCP recommendation:  Smoker:  vapes New sexual partner:  No.    History of abnormal Pap: yes  Procedure Details  The risks and benefits of the procedure and Written informed consent obtained.  Speculum placed in vagina and excellent visualization of cervix achieved, cervix swabbed x 3 with acetic acid solution.  Findings: Adequate colposcopy is noted today.  Cervix: no visible lesions, no mosaicism, no punctation, and no abnormal vasculature; SCJ visualized 360 degrees without lesions and no biopsies taken. Vaginal inspection: vaginal colposcopy not performed. Vulvar colposcopy: vulvar colposcopy not performed.  Specimens: none  Complications: none.  Colposcopic Impression:   Plan(Based on 2019 ASCCP recommendations) Repeat HPV based cytology 1year

## 2023-07-11 ENCOUNTER — Encounter: Payer: Self-pay | Admitting: Pharmacist

## 2023-07-12 ENCOUNTER — Ambulatory Visit: Payer: Medicaid Other | Admitting: Family Medicine

## 2023-07-12 ENCOUNTER — Encounter: Payer: Self-pay | Admitting: Family Medicine

## 2023-07-12 VITALS — BP 142/100 | HR 81 | Ht 66.0 in | Wt 262.1 lb

## 2023-07-12 DIAGNOSIS — E559 Vitamin D deficiency, unspecified: Secondary | ICD-10-CM | POA: Diagnosis not present

## 2023-07-12 DIAGNOSIS — E1169 Type 2 diabetes mellitus with other specified complication: Secondary | ICD-10-CM

## 2023-07-12 DIAGNOSIS — E038 Other specified hypothyroidism: Secondary | ICD-10-CM | POA: Diagnosis not present

## 2023-07-12 DIAGNOSIS — R208 Other disturbances of skin sensation: Secondary | ICD-10-CM | POA: Diagnosis not present

## 2023-07-12 DIAGNOSIS — R630 Anorexia: Secondary | ICD-10-CM

## 2023-07-12 DIAGNOSIS — S8392XD Sprain of unspecified site of left knee, subsequent encounter: Secondary | ICD-10-CM | POA: Diagnosis not present

## 2023-07-12 DIAGNOSIS — I1A0 Resistant hypertension: Secondary | ICD-10-CM

## 2023-07-12 DIAGNOSIS — Z7984 Long term (current) use of oral hypoglycemic drugs: Secondary | ICD-10-CM | POA: Diagnosis not present

## 2023-07-12 DIAGNOSIS — E785 Hyperlipidemia, unspecified: Secondary | ICD-10-CM | POA: Diagnosis not present

## 2023-07-12 DIAGNOSIS — E1142 Type 2 diabetes mellitus with diabetic polyneuropathy: Secondary | ICD-10-CM

## 2023-07-12 MED ORDER — AMLODIPINE BESYLATE 10 MG PO TABS
10.0000 mg | ORAL_TABLET | Freq: Every day | ORAL | 1 refills | Status: DC
Start: 2023-07-12 — End: 2024-05-08

## 2023-07-12 MED ORDER — IBUPROFEN 800 MG PO TABS
800.0000 mg | ORAL_TABLET | Freq: Three times a day (TID) | ORAL | 1 refills | Status: DC
Start: 2023-07-12 — End: 2023-08-14

## 2023-07-12 MED ORDER — RYBELSUS 14 MG PO TABS
14.0000 mg | ORAL_TABLET | Freq: Every day | ORAL | 1 refills | Status: DC
Start: 2023-07-12 — End: 2024-05-30

## 2023-07-12 NOTE — Patient Instructions (Addendum)
I appreciate the opportunity to provide care to you today!    Follow up:  1 months for BP  Labs: please stop by the lab today to get your blood drawn (CBC, CMP, TSH, Lipid profile, HgA1c, Vit D)  Hypertension Management  Medication:Start taking amlodipine 10 mg daily and  hydrochlorothiazide/Lisinopril 20-25 mg daily. Appointment: Please call to schedule an appointment with the hypertensive clinic. Current Blood Pressure: Your blood pressure is above the target goal of <140/90 mmHg. Medication Instructions: -Take your blood pressure medication at the same time each day. -Check your blood pressure at least an hour after taking your medication. If the first reading is >140/90 mmHg, wait at least 10 minutes and recheck your blood pressure. Side Effects: In the initial days of therapy, dizziness or lightheadedness may occur as your body adjusts to the lower blood pressure. This is expected. Diet and Lifestyle: Adhere to a low-sodium diet, limiting intake to less than 1500 mg daily. Increase physical activity. Hydration and Nutrition: Stay well-hydrated by drinking at least 64 ounces of water daily. Increase your servings of fruits and vegetables. Avoid excessive sodium in your diet. Long-Term Considerations: Uncontrolled hypertension can increase the risk of cardiovascular diseases, including stroke, coronary artery disease, and heart failure.  For a knee sprain, nonpharmacological interventions focus on reducing pain, promoting healing, and restoring function. Here are some commonly recommended approaches: -Rest:Avoid Activities: Rest the injured knee and avoid activities that could exacerbate the injury. Using crutches or a knee brace might help reduce strain on the joint. Ice Therapy:Cold Packs: Apply ice or a cold pack to the injured area for 15-20 minutes every 1-2 hours. This helps reduce swelling and numb pain. Compression:Elastic Bandages: Use an elastic bandage or compression wrap to  help control swelling. Ensure it's snug but not so tight that it cuts off circulation. Elevation:Leg Positioning: Elevate the knee above the level of the heart whenever possible to reduce swelling. Use pillows or cushions to keep the knee elevated. Gentle Stretching and Strengthening Exercises:Early Movements: Once the acute pain and swelling decrease, start with gentle range-of-motion exercises to maintain flexibility. Gradually incorporate strengthening exercises for the muscles around the knee, such as quadriceps and hamstring exercises, to support and stabilize the joint. Knee Bracing or Support:Knee Braces: Wearing a knee brace or support can provide stability and help prevent further injury during the healing process.  Activity Modification:Low-Impact Activities: Engage in low-impact activities such as swimming or cycling to maintain overall fitness without putting undue stress on the knee. Heat Therapy:Post-Acute Phase: After the initial 48-72 hours, you can switch to heat therapy (e.g., warm compresses) to relax muscles and improve blood flow, but only if swelling has significantly reduced. Joint Protection Strategies:Use Proper Techniques: When returning to activities, use proper techniques and support to protect the knee from further strain or injury.   Referrals today- Nutritionist and podiatry    Please continue to a heart-healthy diet and increase your physical activities. Try to exercise for at least five days a week.    It was a pleasure to see you and I look forward to continuing to work together on your health and well-being. Please do not hesitate to call the office if you need care or have questions about your care.  In case of emergency, please visit the Emergency Department for urgent care, or contact our clinic at [phone] to schedule an appointment. We're here to help you!   Have a wonderful day and week. With Gratitude, Gilmore Laroche MSN, [name]-BC

## 2023-07-12 NOTE — Progress Notes (Signed)
Established Patient Office Visit  Subjective:  Patient ID: [name], female    DOB: Nov 03, 1986  Age: 37 y.o. MRN: [id number]  CC:  Chief Complaint  Patient presents with   Knee Injury    Pt reports knee pain from spraining it two weeks ago, was advised to f/u with pcp for ortho referral. Also asking for labs on b12, folate and iron panel per psych doctor.   Diabetes    Pt would like refill on medication also due for foot exam.    HPI [name] is a 37 y.o. female with past medical history of type 2 diabetes, resistant hypertension presents for f/u of  chronic medical conditions. For the details of today's visit, please refer to the assessment and plan.    Sprain of left knee: The patient was seen at urgent care on 8/824 for a left knee sprain.  Imaging studies were unremarkable, with no fractures or dislocations noted and mild left knee osteoarthritis.  The patient was provided a knee brace to provide compression and support, along with crutches to help with weight-bearing.  Ibuprofen was prescribed for pain relief, and supportive care was recommended, including RICE therapy.  She denies swelling, redness, and systematic symptoms today.  Pain is rated 3 out of 10.  She is seen in the clinic with the left knee brace.  Range of motion is intact.  Hypertension: Her blood pressure is uncontrolled today in the clinic.  She reports compliance with hydrochlorothiazide-lisinopril 20-25 and amlodipine 5 mg.  She is asymptomatic in the clinic.  Type 2 diabetes: she takes Jardiance 10 mg daily and Rybelsus 14 mg daily.  She denies polyuria, polyphagia, by dyspnea.  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    History of kidney stones    Hypertension    IUD (intrauterine device) in place    Migraines    Neuropathy    Obese    Papanicolaou smear of cervix with positive high risk human papilloma virus (HPV) test 03/22/2021   03/22/21 repeat pap in 1 year per ASCCP guideline, 5 year risk for CIN 3+ is 2.25 %   Polycystic ovarian syndrome    PTSD (post-traumatic stress disorder)    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   Sleep apnea    Trichomonas contact, treated    Type 2 diabetes mellitus (HCC)    Vitamin D deficiency    Yeast infection     Past Surgical History:  Procedure Laterality Date   CESAREAN SECTION  09/24/2012   Procedure: CESAREAN SECTION;  Surgeon: Willodean Rosenthal, MD;  Location: [facility];  Service: Gynecology;  Laterality: N/A;   CHOLECYSTECTOMY     CHOLECYSTECTOMY N/A 04/05/2013   Procedure: LAPAROSCOPIC CHOLECYSTECTOMY;  Surgeon: Dalia Heading, MD;  Location: [facility];  Service: General;  Laterality: N/A;   TONSILLECTOMY      Family History  Problem Relation Age of Onset   Hypertension Mother    Diabetes Mother    Hypertension Father  Asthma Brother    Heart murmur Brother    Crohn's disease Brother    Diabetes Maternal Aunt    Hypertension Maternal Aunt    Diabetes Maternal Uncle    Congestive Heart Failure Maternal Uncle    Cancer Paternal Aunt        uterine   Diabetes Maternal Grandmother    Cancer - Lung Maternal Grandmother    Diabetes Maternal Grandfather    Hypertension Maternal Grandfather    Stroke Maternal Grandfather    COPD Paternal Grandfather     Social History   Socioeconomic History   Marital status: Single    Spouse name: Not on file   Number of children: 1   Years of education: Not on file   Highest education level: 12th grade  Occupational History   Not on file  Tobacco Use    Smoking status: Every Day    Current packs/day: 0.00    Types: Cigarettes, E-cigarettes    Start date: 2018    Last attempt to quit: 2021    Years since quitting: 3.6   Smokeless tobacco: Never   Tobacco comments:    Vape cartridge lasts about a month at a time  Vaping Use   Vaping status: Some Days  Substance and Sexual Activity   Alcohol use: Yes    Comment: Infrequent lately 1 unit of alcohol at the time   Drug use: No   Sexual activity: Yes    Birth control/protection: I.U.D.  Other Topics Concern   Not on file  Social History Narrative   Not on file   Social Determinants of Health   Financial Resource Strain: Patient Declined (06/06/2023)   Overall Financial Resource Strain (CARDIA)    Difficulty of Paying Living Expenses: Patient declined  Food Insecurity: No Food Insecurity (06/06/2023)   Hunger Vital Sign    Worried About Running Out of Food in the Last Year: Never true    Ran Out of Food in the Last Year: Never true  Transportation Needs: No Transportation Needs (06/06/2023)   PRAPARE - Administrator, Civil Service (Medical): No    Lack of Transportation (Non-Medical): No  Physical Activity: Sufficiently Active (06/06/2023)   Exercise Vital Sign    Days of Exercise per Week: 7 days    Minutes of Exercise per Session: 30 min  Stress: Stress Concern Present (06/06/2023)   [employer] of Occupational Health - Occupational Stress Questionnaire    Feeling of Stress : To some extent  Social Connections: Moderately Integrated (06/06/2023)   Social Connection and Isolation Panel [NHANES]    Frequency of Communication with Friends and Family: More than three times a week    Frequency of Social Gatherings with Friends and Family: Once a week    Attends Religious Services: More than 4 times per year    Active Member of [employer] or Organizations: Yes    Attends Banker Meetings: 1 to 4 times per year    Marital Status: Never married  Intimate  Partner Violence: Not At Risk (06/06/2023)   Humiliation, Afraid, Rape, and Kick questionnaire    Fear of Current or Ex-Partner: No    Emotionally Abused: No    Physically Abused: No    Sexually Abused: No    Outpatient Medications Prior to Visit  Medication Sig Dispense Refill   acetaminophen (TYLENOL) 500 MG tablet Take 1,000 mg by mouth as needed.     AIMOVIG 140 MG/ML SOAJ SMARTSIG:140 Milligram(s) SUB-Q Once  a Month     baclofen (LIORESAL) 10 MG tablet Take 10 mg by mouth 3 (three) times daily as needed.     DULoxetine (CYMBALTA) 30 MG capsule Take 1 capsule (30 mg total) by mouth daily. Take with 60mg  capsule daily. 30 capsule 2   DULoxetine (CYMBALTA) 60 MG capsule Take 1 capsule (60 mg total) by mouth daily. Take with 30mg  capsule daily. 30 capsule 2   fluconazole (DIFLUCAN) 150 MG tablet TAKE ONE TABLET BY MOUTH NOW THEN CAN REPEAT IN 3 DAYS 2 tablet 11   gabapentin (NEURONTIN) 300 MG capsule Take 1 capsule (300 mg total) by mouth 3 (three) times daily. 90 capsule 0   hydrOXYzine (ATARAX) 50 MG tablet Take 0.5-1 tablets (25-50 mg total) by mouth every 8 (eight) hours as needed for anxiety (Insomnia). 90 tablet 2   JARDIANCE 10 MG TABS tablet Take 10 mg by mouth daily.     lamoTRIgine (LAMICTAL) 100 MG tablet Take 0.5 tablets (50 mg total) by mouth daily. 45 tablet 1   levonorgestrel (MIRENA) 20 MCG/24HR IUD 1 each by Intrauterine route once.     lisinopril-hydrochlorothiazide (ZESTORETIC) 20-25 MG tablet Take 1 tablet by mouth daily. 60 tablet 1   nystatin cream (MYCOSTATIN) Apply 1 Application topically 2 (two) times daily. 30 g 11   SUMAtriptan (IMITREX) 100 MG tablet Take 1 tablet (100 mg total) by mouth as needed. 10 tablet 0   triamcinolone (KENALOG) 0.025 % ointment Apply 1 Application topically 2 (two) times daily. 30 g 11   valACYclovir (VALTREX) 500 MG tablet Take 1 tablet (500 mg total) by mouth daily. 30 tablet 11   amLODipine (NORVASC) 5 MG tablet Take 1 tablet by mouth  daily.     ibuprofen (ADVIL) 800 MG tablet Take 1 tablet (800 mg total) by mouth 3 (three) times daily. 21 tablet 0   Semaglutide (RYBELSUS) 14 MG TABS Take 1 tablet (14 mg total) by mouth daily. 30 tablet 3   No facility-administered medications prior to visit.    No Known Allergies  ROS Review of Systems  Constitutional:  Negative for chills and fever.  Eyes:  Negative for visual disturbance.  Respiratory:  Negative for chest tightness and shortness of breath.   Neurological:  Negative for dizziness and headaches.      Objective:    Physical Exam HENT:     Head: Normocephalic.     Mouth/Throat:     Mouth: Mucous membranes are moist.  Cardiovascular:     Rate and Rhythm: Normal rate.     Heart sounds: Normal heart sounds.  Pulmonary:     Effort: Pulmonary effort is normal.     Breath sounds: Normal breath sounds.  Musculoskeletal:     Left knee: No swelling, deformity, effusion, erythema or bony tenderness.  Neurological:     Mental Status: She is alert.     BP (!) 142/100 (BP Location: Left Arm)   Pulse 81   Ht 5\' 6"  (1.676 m)   Wt 262 lb 1.3 oz (118.9 kg)   SpO2 96%   BMI 42.30 kg/m  Wt Readings from Last 3 Encounters:  07/12/23 262 lb 1.3 oz (118.9 kg)  06/06/23 266 lb (120.7 kg)  03/14/23 273 lb 0.6 oz (123.9 kg)    Lab Results  Component Value Date   TSH 0.604 07/12/2023   Lab Results  Component Value Date   WBC 8.2 07/12/2023   HGB 13.2 07/12/2023   HCT 40.0 07/12/2023   MCV  93 07/12/2023   PLT 271 07/12/2023   Lab Results  Component Value Date   NA 140 07/12/2023   K 3.8 07/12/2023   CO2 22 07/12/2023   GLUCOSE 92 07/12/2023   BUN 7 07/12/2023   CREATININE 0.79 07/12/2023   BILITOT 0.4 07/12/2023   ALKPHOS 125 (H) 07/12/2023   AST 16 07/12/2023   ALT 13 07/12/2023   PROT 6.8 07/12/2023   ALBUMIN 4.2 07/12/2023   CALCIUM 9.1 07/12/2023   ANIONGAP 10 02/01/2023   EGFR 99 07/12/2023   Lab Results  Component Value Date   CHOL 157  07/12/2023   Lab Results  Component Value Date   HDL 43 07/12/2023   Lab Results  Component Value Date   LDLCALC 98 07/12/2023   Lab Results  Component Value Date   TRIG 83 07/12/2023   Lab Results  Component Value Date   CHOLHDL 3.7 07/12/2023   Lab Results  Component Value Date   HGBA1C 5.8 (H) 07/12/2023      Assessment & Plan:  Type 2 diabetes mellitus with diabetic polyneuropathy, without long-term current use of insulin (HCC) Assessment & Plan: Educated the patient on the importance of decreasing her intake of high sugar foods and beverages We discussed the micro and macrovascular complications of uncontrolled type 2 diabetes Encouraged the patient to continue taking jardiance 10 mg and Rybelsus 14 mg daily Pending hemoglobin  Lab Results  Component Value Date   HGBA1C 5.8 (H) 07/12/2023     Orders: -     HM Diabetes Foot Exam -     Microalbumin / creatinine urine ratio -     Hemoglobin A1c -     Rybelsus; Take 1 tablet (14 mg total) by mouth daily.  Dispense: 90 tablet; Refill: 1  Resistant hypertension Assessment & Plan: Previous workup for secondary hypertension did not show a cause for patient's resistant hypertension Will discontinue amlodipine 5 mg today Encouraged to start taking amlodipine 10 mg and hydrochlorothiazide/Lisinopril 25/20 Low-sodium diet with increased physical activity encouraged BP Readings from Last 3 Encounters:  07/12/23 (!) 142/100  07/03/23 (!) 176/115  06/15/23 (!) 158/106     Orders: -     amLODIPine Besylate; Take 1 tablet (10 mg total) by mouth daily.  Dispense: 90 tablet; Refill: 1  Sprain of left knee, unspecified ligament, subsequent encounter Assessment & Plan: Encouraged RICE therapy Continue taking motrin for pain relief  Orders: -     Ibuprofen; Take 1 tablet (800 mg total) by mouth 3 (three) times daily.  Dispense: 42 tablet; Refill: 1  Decreased sensation of foot -     Ambulatory referral to  Podiatry  Decreased appetite -     B12 and Folate Panel -     Iron, TIBC and Ferritin Panel -     Amb ref to Medical Nutrition Therapy-MNT  Vitamin D deficiency -     VITAMIN D 25 Hydroxy (Vit-D Deficiency, Fractures)  Other specified hypothyroidism -     TSH + free T4  Hyperlipidemia associated with type 2 diabetes mellitus (HCC) -     Lipid panel -     CMP14+EGFR -     CBC with Differential/Platelet  Note: This chart has been completed using Engineer, civil (consulting) software, and while attempts have been made to ensure accuracy, certain words and phrases may not be transcribed as intended.    Follow-up: Return in about 1 month (around 08/11/2023).   Gilmore Laroche, [name]

## 2023-07-14 LAB — CMP14+EGFR
ALT: 13 IU/L (ref 0–32)
AST: 16 IU/L (ref 0–40)
Albumin: 4.2 g/dL (ref 3.9–4.9)
Alkaline Phosphatase: 125 IU/L — ABNORMAL HIGH (ref 44–121)
BUN/Creatinine Ratio: 9 (ref 9–23)
BUN: 7 mg/dL (ref 6–20)
Bilirubin Total: 0.4 mg/dL (ref 0.0–1.2)
CO2: 22 mmol/L (ref 20–29)
Calcium: 9.1 mg/dL (ref 8.7–10.2)
Chloride: 106 mmol/L (ref 96–106)
Creatinine, Ser: 0.79 mg/dL (ref 0.57–1.00)
Globulin, Total: 2.6 g/dL (ref 1.5–4.5)
Glucose: 92 mg/dL (ref 70–99)
Potassium: 3.8 mmol/L (ref 3.5–5.2)
Sodium: 140 mmol/L (ref 134–144)
Total Protein: 6.8 g/dL (ref 6.0–8.5)
eGFR: 99 mL/min/{1.73_m2} (ref >59)

## 2023-07-14 LAB — MICROALBUMIN / CREATININE URINE RATIO
Creatinine, Urine: 197.8 mg/dL
Microalb/Creat Ratio: 8 mg/g{creat} (ref 0–29)
Microalbumin, Urine: 15.8 ug/mL

## 2023-07-14 LAB — CBC WITH DIFFERENTIAL/PLATELET
Basophils Absolute: 0 10*3/uL (ref 0.0–0.2)
Basos: 0 %
EOS (ABSOLUTE): 0.2 10*3/uL (ref 0.0–0.4)
Eos: 2 %
Hematocrit: 40 % (ref 34.0–46.6)
Hemoglobin: 13.2 g/dL (ref 11.1–15.9)
Immature Grans (Abs): 0 10*3/uL (ref 0.0–0.1)
Immature Granulocytes: 0 %
Lymphocytes Absolute: 4.5 10*3/uL — ABNORMAL HIGH (ref 0.7–3.1)
Lymphs: 55 %
MCH: 30.7 pg (ref 26.6–33.0)
MCHC: 33 g/dL (ref 31.5–35.7)
MCV: 93 fL (ref 79–97)
Monocytes Absolute: 0.6 10*3/uL (ref 0.1–0.9)
Monocytes: 7 %
Neutrophils Absolute: 3 10*3/uL (ref 1.4–7.0)
Neutrophils: 36 %
Platelets: 271 10*3/uL (ref 150–450)
RBC: 4.3 x10E6/uL (ref 3.77–5.28)
RDW: 12.6 % (ref 11.7–15.4)
WBC: 8.2 10*3/uL (ref 3.4–10.8)

## 2023-07-14 LAB — IRON,TIBC AND FERRITIN PANEL
Ferritin: 170 ng/mL — ABNORMAL HIGH (ref 15–150)
Iron Saturation: 35 % (ref 15–55)
Iron: 114 ug/dL (ref 27–159)
Total Iron Binding Capacity: 326 ug/dL (ref 250–450)
UIBC: 212 ug/dL (ref 131–425)

## 2023-07-14 LAB — B12 AND FOLATE PANEL
Folate: 6.9 ng/mL (ref >3.0)
Vitamin B-12: 491 pg/mL (ref 232–1245)

## 2023-07-14 LAB — LIPID PANEL
Chol/HDL Ratio: 3.7 ratio (ref 0.0–4.4)
Cholesterol, Total: 157 mg/dL (ref 100–199)
HDL: 43 mg/dL (ref >39)
LDL Chol Calc (NIH): 98 mg/dL (ref 0–99)
Triglycerides: 83 mg/dL (ref 0–149)
VLDL Cholesterol Cal: 16 mg/dL (ref 5–40)

## 2023-07-14 LAB — HEMOGLOBIN A1C
Est. average glucose Bld gHb Est-mCnc: 120 mg/dL
Hgb A1c MFr Bld: 5.8 % — ABNORMAL HIGH (ref 4.8–5.6)

## 2023-07-14 LAB — TSH+FREE T4
Free T4: 1.13 ng/dL (ref 0.82–1.77)
TSH: 0.604 u[IU]/mL (ref 0.450–4.500)

## 2023-07-14 LAB — VITAMIN D 25 HYDROXY (VIT D DEFICIENCY, FRACTURES): Vit D, 25-Hydroxy: 21.4 ng/mL — ABNORMAL LOW (ref 30.0–100.0)

## 2023-07-15 DIAGNOSIS — S8392XA Sprain of unspecified site of left knee, initial encounter: Secondary | ICD-10-CM | POA: Insufficient documentation

## 2023-07-15 NOTE — Assessment & Plan Note (Signed)
Encouraged RICE therapy Continue taking motrin for pain relief

## 2023-07-15 NOTE — Assessment & Plan Note (Signed)
Previous workup for secondary hypertension did not show a cause for patient's resistant hypertension Will discontinue amlodipine 5 mg today Encouraged to start taking amlodipine 10 mg and hydrochlorothiazide/Lisinopril 25/20 Low-sodium diet with increased physical activity encouraged BP Readings from Last 3 Encounters:  07/12/23 (!) 142/100  07/03/23 (!) 176/115  06/15/23 (!) 158/106

## 2023-07-15 NOTE — Assessment & Plan Note (Signed)
Educated the patient on the importance of decreasing her intake of high sugar foods and beverages We discussed the micro and macrovascular complications of uncontrolled type 2 diabetes Encouraged the patient to continue taking jardiance 10 mg and Rybelsus 14 mg daily Pending hemoglobin  Lab Results  Component Value Date   HGBA1C 5.8 (H) 07/12/2023

## 2023-07-16 NOTE — Progress Notes (Signed)
  Please inform the patient that her recent lab results indicate she is prediabetic and her vitamin D levels are slightly low. I recommend she begin taking an over-the-counter vitamin D supplement of 1000 IU daily. Additionally, to help lower her hemoglobin A1c levels, I advise decreasing her intake of high-sugar foods and beverages and increasing her physical activity. These changes are essential for improving her overall health.

## 2023-08-01 ENCOUNTER — Encounter: Payer: Self-pay | Admitting: Adult Health

## 2023-08-01 ENCOUNTER — Ambulatory Visit: Payer: Medicaid Other | Admitting: Adult Health

## 2023-08-01 ENCOUNTER — Telehealth (HOSPITAL_COMMUNITY): Payer: Medicaid Other | Admitting: Psychiatry

## 2023-08-01 VITALS — BP 132/88 | HR 78 | Ht 67.0 in | Wt 262.5 lb

## 2023-08-01 DIAGNOSIS — N907 Vulvar cyst: Secondary | ICD-10-CM

## 2023-08-01 DIAGNOSIS — Z30433 Encounter for removal and reinsertion of intrauterine contraceptive device: Secondary | ICD-10-CM | POA: Insufficient documentation

## 2023-08-01 DIAGNOSIS — I10 Essential (primary) hypertension: Secondary | ICD-10-CM | POA: Insufficient documentation

## 2023-08-01 DIAGNOSIS — Z975 Presence of (intrauterine) contraceptive device: Secondary | ICD-10-CM

## 2023-08-01 DIAGNOSIS — Z3202 Encounter for pregnancy test, result negative: Secondary | ICD-10-CM | POA: Diagnosis not present

## 2023-08-01 LAB — POCT URINE PREGNANCY: Preg Test, Ur: NEGATIVE

## 2023-08-01 MED ORDER — LEVONORGESTREL 20 MCG/DAY IU IUD
1.0000 | INTRAUTERINE_SYSTEM | Freq: Once | INTRAUTERINE | Status: AC
Start: 2023-08-01 — End: 2023-08-01
  Administered 2023-08-01: 1 via INTRAUTERINE

## 2023-08-01 NOTE — Addendum Note (Signed)
Addended by: Colen Darling on: 08/01/2023 10:13 AM   Modules accepted: Orders

## 2023-08-01 NOTE — Progress Notes (Signed)
Patient ID: Karleen Dolphin, female   DOB: May 26, 1986, 37 y.o.   MRN: [id number]   IUD INSERTION Patient name: ANALUZ OH MRN [id number]  Date of birth: 01/15/1986 Subjective Findings:   LUJANE COLQUITT is a 37 y.o. G51P1001 African American female being seen today for removal and  insertion of a Mirena IUD.  Patient's last menstrual period was 06/22/2023. Last sexual intercourse was unknown. Last pap 7/30/24Results were: ASCUS with +HPV had colpo 07/03/23, no biopsies, repeat pap in 1 year   The risks and benefits of the method and placement have been thouroughly reviewed with the patient and all questions were answered.  Specifically the patient is aware of failure rate of 11/998, expulsion of the IUD and of possible perforation.  The patient is aware of irregular bleeding due to the method and understands the incidence of irregular bleeding diminishes with time.  Signed copy of informed consent in chart.      07/12/2023    9:46 AM 07/12/2023    9:34 AM 06/14/2023    9:42 AM 06/06/2023    2:32 PM 03/15/2023    5:15 PM  Depression screen PHQ 2/9  Decreased Interest 1 0 0 2   Down, Depressed, Hopeless 2 0 0 1   PHQ - 2 Score 3 0 0 3   Altered sleeping 2 0 0 0   Tired, decreased energy 2 0 0 1   Change in appetite 1 0 0 1   Feeling bad or failure about yourself  1 0 0 0   Trouble concentrating 2 0 0 2   Moving slowly or fidgety/restless 1 0 0 2   Suicidal thoughts 0 0 0 0   PHQ-9 Score 12 0 0 9   Difficult doing work/chores  Not difficult at all Not difficult at all       Information is confidential and restricted. Go to Review Flowsheets to unlock data.        07/12/2023    9:47 AM 07/12/2023    9:35 AM 06/14/2023    9:42 AM 06/06/2023    2:33 PM  GAD 7 : Generalized Anxiety Score  Nervous, Anxious, on Edge 2 0 0 1  Control/stop worrying 2 0 0 1  Worry too much - different things 2 0 0 1  Trouble relaxing 2 0 0 1  Restless 2 0 0 1  Easily annoyed or irritable 2 0 0 0   Afraid - awful might happen 2 0 0 0  Total GAD 7 Score 14 0 0 5  Anxiety Difficulty Not difficult at all Not difficult at all Not difficult at all      Pertinent History Reviewed:   Reviewed past medical,surgical, social, obstetrical and family history.  Reviewed problem list, medications and allergies. Objective Findings & Procedure:   Vitals:   08/01/23 0925 08/01/23 0936  BP: (!) 130/90 (!) 133/93  Pulse: 82 78  Weight: 262 lb 8 oz (119.1 kg)   Height: 5\' 7"  (1.702 m)   Body mass index is 41.11 kg/m. BP recheck 132/88  Results for orders placed or performed in visit on 08/01/23 (from the past 24 hour(s))  POCT urine pregnancy   Collection Time: 08/01/23  9:25 AM  Result Value Ref Range   Preg Test, Ur Negative Negative     Time out was performed.  A Graves speculum was placed in the vagina.  The cervix was visualized, IUD strings grasped and pt asked to cough and IUD easily  removed,cervix was  prepped using Betadine.  The uterus was found to be neutral and it sounded to 8 cm.  Mirena  IUD placed per manufacturer's recommendations. The strings were trimmed to approximately 3 cm. The patient tolerated the procedure well.   Informal transvaginal sonogram was performed and the proper placement of the IUD was verified by [facility] and myself.  Chaperone: Malachy Mood   Assessment & Plan:   1) Mirena IUD insertion The patient was given post procedure instructions, including signs and symptoms of infection and to check for the strings after each menses or each month, and refraining from intercourse or anything in the vagina for 3 days. She was given a care card with date IUD placed, and date IUD to be removed. She is scheduled for a f/u appointment in 4 weeks. She did vomit after she checked out, but says she is OK now.  Orders Placed This Encounter  Procedures   POCT urine pregnancy    Follow up in 4 weeks for IUD check.  Cyril Mourning NP 08/01/2023 9:56 AM

## 2023-08-04 ENCOUNTER — Other Ambulatory Visit: Payer: Self-pay | Admitting: *Deleted

## 2023-08-04 DIAGNOSIS — G629 Polyneuropathy, unspecified: Secondary | ICD-10-CM

## 2023-08-11 ENCOUNTER — Encounter (HOSPITAL_COMMUNITY): Payer: Self-pay | Admitting: Psychiatry

## 2023-08-11 ENCOUNTER — Telehealth (INDEPENDENT_AMBULATORY_CARE_PROVIDER_SITE_OTHER): Payer: Medicaid Other | Admitting: Psychiatry

## 2023-08-11 DIAGNOSIS — F410 Panic disorder [episodic paroxysmal anxiety] without agoraphobia: Secondary | ICD-10-CM

## 2023-08-11 DIAGNOSIS — G4700 Insomnia, unspecified: Secondary | ICD-10-CM | POA: Diagnosis not present

## 2023-08-11 DIAGNOSIS — F4310 Post-traumatic stress disorder, unspecified: Secondary | ICD-10-CM

## 2023-08-11 DIAGNOSIS — F17290 Nicotine dependence, other tobacco product, uncomplicated: Secondary | ICD-10-CM | POA: Diagnosis not present

## 2023-08-11 DIAGNOSIS — F633 Trichotillomania: Secondary | ICD-10-CM | POA: Diagnosis not present

## 2023-08-11 DIAGNOSIS — E559 Vitamin D deficiency, unspecified: Secondary | ICD-10-CM

## 2023-08-11 DIAGNOSIS — F332 Major depressive disorder, recurrent severe without psychotic features: Secondary | ICD-10-CM | POA: Diagnosis not present

## 2023-08-11 DIAGNOSIS — F411 Generalized anxiety disorder: Secondary | ICD-10-CM

## 2023-08-11 DIAGNOSIS — G4709 Other insomnia: Secondary | ICD-10-CM

## 2023-08-11 MED ORDER — DULOXETINE HCL 30 MG PO CPEP
30.0000 mg | ORAL_CAPSULE | Freq: Every day | ORAL | 2 refills | Status: DC
Start: 2023-08-11 — End: 2023-10-09

## 2023-08-11 MED ORDER — HYDROXYZINE HCL 50 MG PO TABS: 25.0000 mg | ORAL_TABLET | Freq: Three times a day (TID) | ORAL | 2 refills | Status: DC | PRN

## 2023-08-11 MED ORDER — LAMOTRIGINE 150 MG PO TABS: 150.0000 mg | ORAL_TABLET | Freq: Every day | ORAL | 0 refills | Status: DC

## 2023-08-11 MED ORDER — DULOXETINE HCL 60 MG PO CPEP
60.0000 mg | ORAL_CAPSULE | Freq: Every day | ORAL | 2 refills | Status: DC
Start: 2023-08-11 — End: 2023-10-09

## 2023-08-11 NOTE — Patient Instructions (Addendum)
We increased the Lamictal (lamotrigine) to 150 mg nightly today.  We otherwise kept the Cymbalta and hydroxyzine the same.  Keep leaning into your support network when those thoughts of not being alive come back and if they come and stay or worsen please let me know and we can see you sooner.  As we discussed here is the quit Line website which continue nicotine replacement resources for free: [email or web address]-[email or web address]
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
## 2023-08-14 ENCOUNTER — Ambulatory Visit (INDEPENDENT_AMBULATORY_CARE_PROVIDER_SITE_OTHER): Payer: Medicaid Other | Admitting: Family Medicine

## 2023-08-14 ENCOUNTER — Telehealth: Payer: Self-pay | Admitting: Family Medicine

## 2023-08-14 ENCOUNTER — Encounter: Payer: Self-pay | Admitting: Family Medicine

## 2023-08-14 VITALS — BP 142/86 | HR 97 | Ht 67.0 in | Wt 263.1 lb

## 2023-08-14 DIAGNOSIS — J029 Acute pharyngitis, unspecified: Secondary | ICD-10-CM

## 2023-08-14 DIAGNOSIS — S8392XD Sprain of unspecified site of left knee, subsequent encounter: Secondary | ICD-10-CM

## 2023-08-14 DIAGNOSIS — I1A0 Resistant hypertension: Secondary | ICD-10-CM

## 2023-08-14 MED ORDER — IBUPROFEN 800 MG PO TABS
800.0000 mg | ORAL_TABLET | Freq: Three times a day (TID) | ORAL | 1 refills | Status: DC
Start: 2023-08-14 — End: 2024-07-25

## 2023-08-14 MED ORDER — HYDRALAZINE HCL 10 MG PO TABS
10.0000 mg | ORAL_TABLET | Freq: Three times a day (TID) | ORAL | 1 refills | Status: DC
Start: 2023-08-14 — End: 2024-05-08

## 2023-08-14 NOTE — Patient Instructions (Addendum)
I appreciate the opportunity to provide care to you today!    Follow up:  1 month for BP  Please schedule diabetic eye exam   Hypertension Management  Your current blood pressure is above the target goal of <140/90 mmHg. To address this, please start taking Hydralazine 10 mg BID, Amlodipine 10 mg daily and lisinopril-hydrochlorothiazide 20-25 mg daily  Medication Instructions: Take your blood pressure medication at the same time each day. After taking your medication, check your blood pressure at least an hour later. If your first reading is >140/90 mmHg, wait at least 10 minutes and recheck your blood pressure. Side Effects: In the initial days of therapy, you may experience dizziness or lightheadedness as your body adjusts to the lower blood pressure; this is expected. Diet and Lifestyle: Adhere to a low-sodium diet, limiting intake to less than 1500 mg daily, and increase your physical activity. Avoid over-the-counter NSAIDs such as ibuprofen and naproxen while on this medication. Hydration and Nutrition: Stay well-hydrated by drinking at least 64 ounces of water daily. Increase your servings of fruits and vegetables and avoid excessive sodium in your diet. Long-Term Considerations: Uncontrolled hypertension can increase the risk of cardiovascular diseases, including stroke, coronary artery disease, and heart failure.  Please report to the emergency department if your blood pressure exceeds 180/120 and is accompanied by symptoms such as headaches, chest pain, palpitations, blurred vision, or dizziness.  Sore Throat Increase fluids and allow for plenty of rest. Warm salt water gargles 3-4 times daily to help with throat pain or discomfort. Follow-up if your symptoms do not improve    Attached with your AVS, you will find valuable resources for self-education. I highly recommend dedicating some time to thoroughly examine them.   Please continue to a heart-healthy diet and increase your  physical activities. Try to exercise for at least five days a week.    It was a pleasure to see you and I look forward to continuing to work together on your health and well-being. Please do not hesitate to call the office if you need care or have questions about your care.  In case of emergency, please visit the Emergency Department for urgent care, or contact our clinic at [phone] to schedule an appointment. We're here to help you!   Have a wonderful day and week. With Gratitude, Gilmore Laroche MSN, [name]-BC

## 2023-08-14 NOTE — Progress Notes (Signed)
Established Patient Office Visit  Subjective:  Patient ID: [name], female    DOB: 1986-05-12  Age: 37 y.o. MRN: [id number]  CC:  Chief Complaint  Patient presents with   Care Management    1 month f/u, pt f/u on left knee states she fell over the weekend in the shower.    Sore Throat    Pt reports dry throat started 08/13/23, just really dry and sore.     HPI [name] is a 37 y.o. female presents for blood pressure f/u   Hypertension: The patient is currently taking hydrochlorothiazide-lisinopril 20-25 mg and amlodipine 10 mg daily, reporting compliance with her treatment regimen. She denies symptoms of headaches, dizziness, blurred vision, chest pain, or palpitations.  Fall: The patient reports a recent fall in the shower when her leg gave out while standing, resulting in her landing on her left knee. She complains of tenderness at the site of impact, rating the pain as 8/10 and describing it as aching. She reports pain with knee flexion but no pain with extension. There is no swelling or redness noted.  Sore Throat: The patient complains of a scratchy sore throat but denies any cough, hoarseness, fever, recent upper respiratory infection, headaches, or body aches.   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    History of kidney stones    Hypertension    IUD (intrauterine device) in place    Migraines    Neuropathy    Obese    Osteoarthritis    left knee   Papanicolaou smear of cervix with positive high risk human papilloma virus  (HPV) test 03/22/2021   03/22/21 repeat pap in 1 year per ASCCP guideline, 5 year risk for CIN 3+ is 2.25 %   Polycystic ovarian syndrome    PTSD (post-traumatic stress disorder)    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   Sleep apnea    Trichomonas contact, treated    Type 2 diabetes mellitus (HCC)    Vitamin D deficiency    Yeast infection     Past Surgical History:  Procedure Laterality Date   CESAREAN SECTION  09/24/2012   Procedure: CESAREAN SECTION;  Surgeon: Willodean Rosenthal, MD;  Location: [facility];  Service: Gynecology;  Laterality: N/A;   CHOLECYSTECTOMY     CHOLECYSTECTOMY N/A 04/05/2013   Procedure: LAPAROSCOPIC CHOLECYSTECTOMY;  Surgeon: Dalia Heading, MD;  Location: [facility];  Service: General;  Laterality: N/A;   TONSILLECTOMY      Family History  Problem Relation Age of Onset   Hypertension Mother    Diabetes Mother    Hypertension Father    Asthma Brother    Heart murmur Brother    Crohn's disease Brother    Diabetes Maternal Aunt    Hypertension Maternal Aunt    Diabetes Maternal Uncle    Congestive Heart Failure Maternal Uncle    Cancer Paternal Aunt        uterine   Diabetes Maternal Grandmother    Cancer -  Lung Maternal Grandmother    Diabetes Maternal Grandfather    Hypertension Maternal Grandfather    Stroke Maternal Grandfather    COPD Paternal Grandfather     Social History   Socioeconomic History   Marital status: Single    Spouse name: Not on file   Number of children: 1   Years of education: Not on file   Highest education level: 12th grade  Occupational History   Not on file  Tobacco Use   Smoking status: Some Days    Current packs/day: 0.00    Types: Cigarettes, E-cigarettes    Start date: 2018    Last attempt to quit: 2021    Years since quitting: 3.7   Smokeless tobacco: Never   Tobacco comments:    Vape cartridge lasts about a month at a time  Vaping Use   Vaping status: Some Days  Substance and Sexual Activity   Alcohol  use: Yes    Comment: Infrequent lately 1 unit of alcohol at the time   Drug use: No   Sexual activity: Yes    Birth control/protection: I.U.D.    Comment: female partner  Other Topics Concern   Not on file  Social History Narrative   Not on file   Social Determinants of Health   Financial Resource Strain: Patient Declined (06/06/2023)   Overall Financial Resource Strain (CARDIA)    Difficulty of Paying Living Expenses: Patient declined  Food Insecurity: No Food Insecurity (06/06/2023)   Hunger Vital Sign    Worried About Running Out of Food in the Last Year: Never true    Ran Out of Food in the Last Year: Never true  Transportation Needs: No Transportation Needs (06/06/2023)   PRAPARE - Administrator, Civil Service (Medical): No    Lack of Transportation (Non-Medical): No  Physical Activity: Sufficiently Active (06/06/2023)   Exercise Vital Sign    Days of Exercise per Week: 7 days    Minutes of Exercise per Session: 30 min  Stress: Stress Concern Present (06/06/2023)   [employer] of Occupational Health - Occupational Stress Questionnaire    Feeling of Stress : To some extent  Social Connections: Moderately Integrated (06/06/2023)   Social Connection and Isolation Panel [NHANES]    Frequency of Communication with Friends and Family: More than three times a week    Frequency of Social Gatherings with Friends and Family: Once a week    Attends Religious Services: More than 4 times per year    Active Member of [employer] or Organizations: Yes    Attends Banker Meetings: 1 to 4 times per year    Marital Status: Never married  Intimate Partner Violence: Not At Risk (06/06/2023)   Humiliation, Afraid, Rape, and Kick questionnaire    Fear of Current or Ex-Partner: No    Emotionally Abused: No    Physically Abused: No    Sexually Abused: No    Outpatient Medications Prior to Visit  Medication Sig Dispense Refill   acetaminophen (TYLENOL) 500 MG tablet  Take 1,000 mg by mouth as needed.     AIMOVIG 140 MG/ML SOAJ      amLODipine (NORVASC) 10 MG tablet Take 1 tablet (10 mg total) by mouth daily. 90 tablet 1   DULoxetine (CYMBALTA) 30 MG capsule Take 1 capsule (30 mg total) by mouth daily. Take with 60mg  capsule daily. 30 capsule 2   DULoxetine (CYMBALTA) 60 MG capsule Take 1 capsule (60 mg total) by mouth daily.  Take with 30mg  capsule daily. 30 capsule 2   fluconazole (DIFLUCAN) 150 MG tablet TAKE ONE TABLET BY MOUTH NOW THEN CAN REPEAT IN 3 DAYS 2 tablet 11   hydrOXYzine (ATARAX) 50 MG tablet Take 0.5-1 tablets (25-50 mg total) by mouth every 8 (eight) hours as needed for anxiety (Insomnia). 90 tablet 2   JARDIANCE 10 MG TABS tablet Take 10 mg by mouth daily.     lamoTRIgine (LAMICTAL) 150 MG tablet Take 1 tablet (150 mg total) by mouth daily. 90 tablet 0   levonorgestrel (MIRENA) 20 MCG/24HR IUD 1 each by Intrauterine route once.     lisinopril-hydrochlorothiazide (ZESTORETIC) 20-25 MG tablet Take 1 tablet by mouth daily. 60 tablet 1   nystatin cream (MYCOSTATIN) Apply 1 Application topically 2 (two) times daily. 30 g 11   Semaglutide (RYBELSUS) 14 MG TABS Take 1 tablet (14 mg total) by mouth daily. 90 tablet 1   SUMAtriptan (IMITREX) 100 MG tablet Take 1 tablet (100 mg total) by mouth as needed. 10 tablet 0   triamcinolone (KENALOG) 0.025 % ointment Apply 1 Application topically 2 (two) times daily. 30 g 11   valACYclovir (VALTREX) 500 MG tablet Take 1 tablet (500 mg total) by mouth daily. 30 tablet 11   ibuprofen (ADVIL) 800 MG tablet Take 1 tablet (800 mg total) by mouth 3 (three) times daily. 42 tablet 1   baclofen (LIORESAL) 10 MG tablet Take 10 mg by mouth 3 (three) times daily as needed. (Patient not taking: Reported on 08/14/2023)     gabapentin (NEURONTIN) 300 MG capsule Take 1 capsule (300 mg total) by mouth 3 (three) times daily. 90 capsule 0   No facility-administered medications prior to visit.    No Known  Allergies  ROS Review of Systems  Constitutional:  Negative for chills and fever.  HENT:  Positive for sore throat.   Eyes:  Negative for visual disturbance.  Respiratory:  Negative for chest tightness and shortness of breath.   Musculoskeletal:  Positive for arthralgias.  Neurological:  Negative for dizziness and headaches.      Objective:    Physical Exam HENT:     Head: Normocephalic.     Mouth/Throat:     Mouth: Mucous membranes are moist.     Pharynx: No pharyngeal swelling, oropharyngeal exudate, posterior oropharyngeal erythema or uvula swelling.     Tonsils: No tonsillar exudate or tonsillar abscesses.  Cardiovascular:     Rate and Rhythm: Normal rate.     Heart sounds: Normal heart sounds.  Pulmonary:     Effort: Pulmonary effort is normal.     Breath sounds: Normal breath sounds.  Musculoskeletal:     Left knee: Decreased range of motion. Tenderness present over the patellar tendon.  Neurological:     Mental Status: She is alert.     BP (!) 142/86 (BP Location: Left Arm)   Pulse 97   Ht 5\' 7"  (1.702 m)   Wt 263 lb 1.9 oz (119.4 kg)   LMP 06/22/2023   SpO2 98%   BMI 41.21 kg/m  Wt Readings from Last 3 Encounters:  08/14/23 263 lb 1.9 oz (119.4 kg)  08/01/23 262 lb 8 oz (119.1 kg)  07/12/23 262 lb 1.3 oz (118.9 kg)    Lab Results  Component Value Date   TSH 0.604 07/12/2023   Lab Results  Component Value Date   WBC 8.2 07/12/2023   HGB 13.2 07/12/2023   HCT 40.0 07/12/2023   MCV 93 07/12/2023  PLT 271 07/12/2023   Lab Results  Component Value Date   NA 140 07/12/2023   K 3.8 07/12/2023   CO2 22 07/12/2023   GLUCOSE 92 07/12/2023   BUN 7 07/12/2023   CREATININE 0.79 07/12/2023   BILITOT 0.4 07/12/2023   ALKPHOS 125 (H) 07/12/2023   AST 16 07/12/2023   ALT 13 07/12/2023   PROT 6.8 07/12/2023   ALBUMIN 4.2 07/12/2023   CALCIUM 9.1 07/12/2023   ANIONGAP 10 02/01/2023   EGFR 99 07/12/2023   Lab Results  Component Value Date   CHOL  157 07/12/2023   Lab Results  Component Value Date   HDL 43 07/12/2023   Lab Results  Component Value Date   LDLCALC 98 07/12/2023   Lab Results  Component Value Date   TRIG 83 07/12/2023   Lab Results  Component Value Date   CHOLHDL 3.7 07/12/2023   Lab Results  Component Value Date   HGBA1C 5.8 (H) 07/12/2023      Assessment & Plan:  Resistant hypertension Assessment & Plan: Uncontrolled She will be following up with hypertensive clinic on 09/08/2023 Hypertension Management  Your current blood pressure is above the target goal of <140/90 mmHg. To address this, please start taking Hydralazine 10 mg BID, Amlodipine 10 mg daily and lisinopril-hydrochlorothiazide 20-25 mg daily  Medication Instructions: Take your blood pressure medication at the same time each day. After taking your medication, check your blood pressure at least an hour later. If your first reading is >140/90 mmHg, wait at least 10 minutes and recheck your blood pressure. Side Effects: In the initial days of therapy, you may experience dizziness or lightheadedness as your body adjusts to the lower blood pressure; this is expected. Diet and Lifestyle: Adhere to a low-sodium diet, limiting intake to less than 1500 mg daily, and increase your physical activity. Avoid over-the-counter NSAIDs such as ibuprofen and naproxen while on this medication. Hydration and Nutrition: Stay well-hydrated by drinking at least 64 ounces of water daily. Increase your servings of fruits and vegetables and avoid excessive sodium in your diet. Long-Term Considerations: Uncontrolled hypertension can increase the risk of cardiovascular diseases, including stroke, coronary artery disease, and heart failure.  Please report to the emergency department if your blood pressure exceeds 180/120 and is accompanied by symptoms such as headaches, chest pain, palpitations, blurred vision, or dizziness.   Orders: -     hydrALAZINE HCl; Take 1  tablet (10 mg total) by mouth 3 (three) times daily.  Dispense: 90 tablet; Refill: 1  Sprain of left knee, unspecified ligament, subsequent encounter Assessment & Plan:  The patient is encouraged to rest and avoid weightbearing to prevent further injury. RICE therapy (Rest, Ice, Compression, Elevation) is recommended. An imaging study will be obtained to assess for a fracture of the patella or the bones around the knee. The patient is advised to take ibuprofen 800 mg every 8 hours as needed for pain relief and to continue using her knee brace.  Orders: -     Ibuprofen; Take 1 tablet (800 mg total) by mouth 3 (three) times daily.  Dispense: 80 tablet; Refill: 1 -     DG Knee Complete 4 Views Left  Sore throat Assessment & Plan: The rapid strep test is negative. The patient is encouraged to increase fluid intake and allow for plenty of rest. Warm salt water gargles are recommended 3-4 times daily to help alleviate throat pain or discomfort.  Orders: -     Rapid Strep Screen (  [facility] ONLY)  Note: This chart has been completed using Engineer, civil (consulting) software, and while attempts have been made to ensure accuracy, certain words and phrases may not be transcribed as intended.    Follow-up: Return in about 1 month (around 09/14/2023) for BP.   Gilmore Laroche, [name]

## 2023-08-14 NOTE — Assessment & Plan Note (Signed)
The rapid strep test is negative. The patient is encouraged to increase fluid intake and allow for plenty of rest. Warm salt water gargles are recommended 3-4 times daily to help alleviate throat pain or discomfort.

## 2023-08-14 NOTE — Assessment & Plan Note (Signed)
  The patient is encouraged to rest and avoid weightbearing to prevent further injury. RICE therapy (Rest, Ice, Compression, Elevation) is recommended. An imaging study will be obtained to assess for a fracture of the patella or the bones around the knee. The patient is advised to take ibuprofen 800 mg every 8 hours as needed for pain relief and to continue using her knee brace.

## 2023-08-14 NOTE — Assessment & Plan Note (Signed)
Uncontrolled She will be following up with hypertensive clinic on 09/08/2023 Hypertension Management  Your current blood pressure is above the target goal of <140/90 mmHg. To address this, please start taking Hydralazine 10 mg BID, Amlodipine 10 mg daily and lisinopril-hydrochlorothiazide 20-25 mg daily  Medication Instructions: Take your blood pressure medication at the same time each day. After taking your medication, check your blood pressure at least an hour later. If your first reading is >140/90 mmHg, wait at least 10 minutes and recheck your blood pressure. Side Effects: In the initial days of therapy, you may experience dizziness or lightheadedness as your body adjusts to the lower blood pressure; this is expected. Diet and Lifestyle: Adhere to a low-sodium diet, limiting intake to less than 1500 mg daily, and increase your physical activity. Avoid over-the-counter NSAIDs such as ibuprofen and naproxen while on this medication. Hydration and Nutrition: Stay well-hydrated by drinking at least 64 ounces of water daily. Increase your servings of fruits and vegetables and avoid excessive sodium in your diet. Long-Term Considerations: Uncontrolled hypertension can increase the risk of cardiovascular diseases, including stroke, coronary artery disease, and heart failure.  Please report to the emergency department if your blood pressure exceeds 180/120 and is accompanied by symptoms such as headaches, chest pain, palpitations, blurred vision, or dizziness.

## 2023-08-14 NOTE — Assessment & Plan Note (Deleted)
Uncontrolled Hypertension Management  Your current blood pressure is above the target goal of <140/90 mmHg. To address this, please start taking Hydralazine 10 mg BID, Amlodipine 10 mg daily and lisinopril-hydrochlorothiazide 20-25 mg daily  Medication Instructions: Take your blood pressure medication at the same time each day. After taking your medication, check your blood pressure at least an hour later. If your first reading is >140/90 mmHg, wait at least 10 minutes and recheck your blood pressure. Side Effects: In the initial days of therapy, you may experience dizziness or lightheadedness as your body adjusts to the lower blood pressure; this is expected. Diet and Lifestyle: Adhere to a low-sodium diet, limiting intake to less than 1500 mg daily, and increase your physical activity. Avoid over-the-counter NSAIDs such as ibuprofen and naproxen while on this medication. Hydration and Nutrition: Stay well-hydrated by drinking at least 64 ounces of water daily. Increase your servings of fruits and vegetables and avoid excessive sodium in your diet. Long-Term Considerations: Uncontrolled hypertension can increase the risk of cardiovascular diseases, including stroke, coronary artery disease, and heart failure.  Please report to the emergency department if your blood pressure exceeds 180/120 and is accompanied by symptoms such as headaches, chest pain, palpitations, blurred vision, or dizziness.

## 2023-08-15 LAB — RAPID STREP SCREEN (MED CTR MEBANE ONLY): Strep Gp A Ag, IA W/Reflex: NEGATIVE

## 2023-08-15 LAB — CULTURE, GROUP A STREP

## 2023-08-15 NOTE — Telephone Encounter (Signed)
Pt has been informed.

## 2023-08-16 ENCOUNTER — Ambulatory Visit (HOSPITAL_COMMUNITY)
Admission: RE | Admit: 2023-08-16 | Discharge: 2023-08-16 | Disposition: A | Discharge status: Home / Self Care | Payer: Medicaid Other | Source: Ambulatory Visit | Attending: Family Medicine | Admitting: Family Medicine

## 2023-08-16 DIAGNOSIS — S8392XD Sprain of unspecified site of left knee, subsequent encounter: Secondary | ICD-10-CM | POA: Insufficient documentation

## 2023-08-17 NOTE — Progress Notes (Signed)
Patient ID: [name], female   DOB: June 28, 1986, 37 y.o.   MRN: [id number]  Reason for Consult: PVD   Referred by Gilmore Laroche, [name]  Subjective:     HPI  [name] is a 37 y.o. female with a history of HTN, OSA, type 2 diabetes on oral medications who presents for decreased sensation of bilateral feet.  She reports this has been going on for many years.  She denies history of claudication, rest pain or nonhealing ulcers.  She is a former smoker, quit in 2021.  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    History of kidney stones    Hypertension    IUD (intrauterine device) in place    Migraines    Neuropathy    Obese    Osteoarthritis    left knee   Papanicolaou smear of cervix with positive high risk human papilloma virus (HPV) test 03/22/2021   03/22/21 repeat pap in 1 year per ASCCP guideline, 5 year risk for CIN 3+ is 2.25 %   Polycystic ovarian syndrome    PTSD (post-traumatic stress disorder)    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   Sleep apnea    Trichomonas contact, treated    Type 2 diabetes mellitus (HCC)    Vitamin D deficiency    Yeast infection    Family History  Problem Relation Age of Onset   Hypertension Mother    Diabetes Mother    Hypertension Father    Asthma Brother    Heart murmur Brother    Crohn's disease Brother    Diabetes Maternal Aunt    Hypertension Maternal Aunt    Diabetes Maternal Uncle    Congestive Heart Failure Maternal Uncle    Cancer Paternal Aunt        uterine   Diabetes Maternal  Grandmother    Cancer - Lung Maternal Grandmother    Diabetes Maternal Grandfather    Hypertension Maternal Grandfather    Stroke Maternal Grandfather    COPD Paternal Grandfather    Past Surgical History:  Procedure Laterality Date   CESAREAN SECTION  09/24/2012   Procedure: CESAREAN SECTION;  Surgeon: Willodean Rosenthal, MD;  Location: [facility];  Service: Gynecology;  Laterality: N/A;   CHOLECYSTECTOMY     CHOLECYSTECTOMY N/A 04/05/2013   Procedure: LAPAROSCOPIC CHOLECYSTECTOMY;  Surgeon: Dalia Heading, MD;  Location: [facility];  Service: General;  Laterality: N/A;   TONSILLECTOMY      Short Social History:  Social History   Tobacco Use   Smoking status: Some Days    Current packs/day: 0.00    Types: Cigarettes, E-cigarettes    Start date: 2018    Last attempt to quit: 2021    Years since quitting: 3.7   Smokeless tobacco: Never   Tobacco comments:    Vape cartridge lasts about a month at a time  Substance Use Topics   Alcohol use: Yes    Comment: Infrequent lately 1 unit of alcohol at the time  No Known Allergies  Current Outpatient Medications  Medication Sig Dispense Refill   acetaminophen (TYLENOL) 500 MG tablet Take 1,000 mg by mouth as needed.     AIMOVIG 140 MG/ML SOAJ      amLODipine (NORVASC) 10 MG tablet Take 1 tablet (10 mg total) by mouth daily. 90 tablet 1   baclofen (LIORESAL) 10 MG tablet Take 10 mg by mouth 3 (three) times daily as needed.     DULoxetine (CYMBALTA) 30 MG capsule Take 1 capsule (30 mg total) by mouth daily. Take with 60mg  capsule daily. 30 capsule 2   DULoxetine (CYMBALTA) 60 MG capsule Take 1 capsule (60 mg total) by mouth daily. Take with 30mg  capsule daily. 30 capsule 2   fluconazole (DIFLUCAN) 150 MG tablet TAKE ONE TABLET BY MOUTH NOW THEN CAN REPEAT IN 3 DAYS 2 tablet 11   hydrALAZINE (APRESOLINE) 10 MG tablet Take 1 tablet (10 mg total) by mouth 3 (three) times daily. 90 tablet 1   hydrOXYzine (ATARAX) 50 MG tablet Take 0.5-1  tablets (25-50 mg total) by mouth every 8 (eight) hours as needed for anxiety (Insomnia). 90 tablet 2   ibuprofen (ADVIL) 800 MG tablet Take 1 tablet (800 mg total) by mouth 3 (three) times daily. 80 tablet 1   JARDIANCE 10 MG TABS tablet Take 10 mg by mouth daily.     lamoTRIgine (LAMICTAL) 150 MG tablet Take 1 tablet (150 mg total) by mouth daily. 90 tablet 0   levonorgestrel (MIRENA) 20 MCG/24HR IUD 1 each by Intrauterine route once.     lisinopril-hydrochlorothiazide (ZESTORETIC) 20-25 MG tablet Take 1 tablet by mouth daily. 60 tablet 1   nystatin cream (MYCOSTATIN) Apply 1 Application topically 2 (two) times daily. 30 g 11   Semaglutide (RYBELSUS) 14 MG TABS Take 1 tablet (14 mg total) by mouth daily. 90 tablet 1   SUMAtriptan (IMITREX) 100 MG tablet Take 1 tablet (100 mg total) by mouth as needed. 10 tablet 0   triamcinolone (KENALOG) 0.025 % ointment Apply 1 Application topically 2 (two) times daily. 30 g 11   valACYclovir (VALTREX) 500 MG tablet Take 1 tablet (500 mg total) by mouth daily. 30 tablet 11   gabapentin (NEURONTIN) 300 MG capsule Take 1 capsule (300 mg total) by mouth 3 (three) times daily. 90 capsule 0   No current facility-administered medications for this visit.    REVIEW OF SYSTEMS  Negative other than noted in HPI     Objective:  Objective   Vitals:   08/18/23 0935  BP: (!) 155/112  Pulse: 79  Temp: 98.2 F (36.8 C)  TempSrc: Temporal  SpO2: 97%  Weight: 263 lb 6.4 oz (119.5 kg)  Height: 5\' 7"  (1.702 m)   Body mass index is 41.25 kg/m.  Physical Exam General: no acute distress Cardiac: hemodynamically stable, nontachycardic Pulm: normal work of breathing Neuro: alert, no focal deficit Extremities: No edema, cyanosis or wounds Vascular: Palpable DP, PT bilaterally   Data: ABI: ABI Findings:  +---------+------------------+-----+---------+--------+  Right   Rt Pressure (mmHg)IndexWaveform Comment    +---------+------------------+-----+---------+--------+  Brachial 154                                       +---------+------------------+-----+---------+--------+  PTA     197               1.28 triphasic          +---------+------------------+-----+---------+--------+  DP      197               1.28 triphasic          +---------+------------------+-----+---------+--------+  Great Toe146               0.95                    +---------+------------------+-----+---------+--------+   +---------+------------------+-----+---------+-------+  Left    Lt Pressure (mmHg)IndexWaveform Comment  +---------+------------------+-----+---------+-------+  Brachial 152                                      +---------+------------------+-----+---------+-------+  PTA     195               1.27 triphasic         +---------+------------------+-----+---------+-------+  DP      197               1.28 triphasic         +---------+------------------+-----+---------+-------+  Great Toe162               1.05                   +---------+------------------+-----+---------+-------+   +-------+-----------+-----------+------------+------------+  ABI/TBIToday's ABIToday's TBIPrevious ABIPrevious TBI  +-------+-----------+-----------+------------+------------+  Right 1.28       0.98                                 +-------+-----------+-----------+------------+------------+  Left  1.28       1.05                                 +-------+-----------+-----------+------------+------------+   A1c from 07/12/2023 was 5.8  CMP from 07/12/2023, creatinine and LFTs normal, lipid panel normal     Assessment/Plan:     [name] is a 37 y.o. female with neuropathy.  We discussed that she has normal perfusion although given her history of smoking and diabetes she could be at risk of future development of PAD.Marland Kitchen  Encouraged continued abstinence from  tobacco products and continue strict glycemic control.  Follow-up as needed    Recommendations to optimize cardiovascular risk: Abstinence from all tobacco products. Blood glucose control with goal A1c < 7%. Blood pressure control with goal blood pressure < 140/90 mmHg. Lipid reduction therapy with goal LDL-C <100 mg/dL      Daria Pastures MD [facility]

## 2023-08-18 ENCOUNTER — Encounter: Payer: Self-pay | Admitting: Vascular Surgery

## 2023-08-18 ENCOUNTER — Ambulatory Visit: Payer: Medicaid Other | Admitting: Vascular Surgery

## 2023-08-18 ENCOUNTER — Other Ambulatory Visit: Payer: Self-pay

## 2023-08-18 ENCOUNTER — Ambulatory Visit (HOSPITAL_COMMUNITY)
Admission: RE | Admit: 2023-08-18 | Discharge: 2023-08-18 | Disposition: A | Discharge status: Home / Self Care | Payer: Medicaid Other | Source: Ambulatory Visit | Attending: Vascular Surgery | Admitting: Vascular Surgery

## 2023-08-18 ENCOUNTER — Ambulatory Visit: Payer: Medicaid Other

## 2023-08-18 VITALS — BP 155/112 | HR 79 | Temp 98.2°F | Ht 67.0 in | Wt 263.4 lb

## 2023-08-18 DIAGNOSIS — E119 Type 2 diabetes mellitus without complications: Secondary | ICD-10-CM

## 2023-08-18 DIAGNOSIS — E1140 Type 2 diabetes mellitus with diabetic neuropathy, unspecified: Secondary | ICD-10-CM

## 2023-08-18 DIAGNOSIS — I10 Essential (primary) hypertension: Secondary | ICD-10-CM

## 2023-08-18 DIAGNOSIS — G629 Polyneuropathy, unspecified: Secondary | ICD-10-CM | POA: Diagnosis not present

## 2023-08-18 LAB — VAS US ABI WITH/WO TBI
Left ABI: 1.28
Right ABI: 1.28

## 2023-08-29 ENCOUNTER — Ambulatory Visit: Payer: Medicaid Other | Admitting: Adult Health

## 2023-08-29 ENCOUNTER — Other Ambulatory Visit: Payer: Medicaid Other | Admitting: Radiology

## 2023-08-29 ENCOUNTER — Encounter: Payer: Self-pay | Admitting: Adult Health

## 2023-08-29 VITALS — BP 133/97 | HR 86 | Ht 67.0 in | Wt 266.5 lb

## 2023-08-29 DIAGNOSIS — I10 Essential (primary) hypertension: Secondary | ICD-10-CM

## 2023-08-29 DIAGNOSIS — Z30431 Encounter for routine checking of intrauterine contraceptive device: Secondary | ICD-10-CM

## 2023-08-29 DIAGNOSIS — Z975 Presence of (intrauterine) contraceptive device: Secondary | ICD-10-CM

## 2023-08-29 DIAGNOSIS — T8332XA Displacement of intrauterine contraceptive device, initial encounter: Secondary | ICD-10-CM

## 2023-08-29 DIAGNOSIS — N898 Other specified noninflammatory disorders of vagina: Secondary | ICD-10-CM | POA: Diagnosis not present

## 2023-08-29 DIAGNOSIS — Z3202 Encounter for pregnancy test, result negative: Secondary | ICD-10-CM

## 2023-08-29 LAB — POCT URINE PREGNANCY: Preg Test, Ur: NEGATIVE

## 2023-08-29 MED ORDER — VALACYCLOVIR HCL 1 G PO TABS: 1000.0000 mg | ORAL_TABLET | Freq: Every day | ORAL | 12 refills | Status: DC

## 2023-08-29 NOTE — Progress Notes (Signed)
Please inform the patient that her x-ray shows  signs of osteoarthritis, a condition where the cartilage in the joint wears down over time, leading to pain, stiffness, and reduced movement. However, there are no acute osseous abnormalities, which means there are no new or urgent issues related to the bones, such as fractures, dislocations, or other significant bone damage.

## 2023-08-29 NOTE — Progress Notes (Signed)
  Subjective:     Patient ID: [name], female   DOB: Oct 07, 1986, 37 y.o.   MRN: [id number]  HPI Melayah is a 37 year old black female, single, G1P1001, in for IUD check, has had vaginal dryness since IUD placed,08/01/23.     Component Value Date/Time   DIAGPAP (A) 06/06/2023 1449    - Atypical squamous cells of undetermined significance (ASC-US)   DIAGPAP (A) 03/14/2022 0839    - Atypical squamous cells of undetermined significance (ASC-US)   DIAGPAP  03/12/2021 1132    - Negative for intraepithelial lesion or malignancy (NILM)   HPVHIGH Positive (A) 06/06/2023 1449   HPVHIGH Negative 03/14/2022 0839   HPVHIGH Positive (A) 03/12/2021 1132   ADEQPAP  06/06/2023 1449    Satisfactory for evaluation; transformation zone component PRESENT.   ADEQPAP  03/14/2022 0839    Satisfactory for evaluation; transformation zone component PRESENT.   ADEQPAP  03/12/2021 1132    Satisfactory for evaluation; transformation zone component PRESENT.    PCP is Gilmore Laroche NP Review of Systems +vaginal dryness Sex hurts, so dry Reviewed past medical,surgical, social and family history. Reviewed medications and allergies.     Objective:   Physical Exam BP (!) 133/97 (BP Location: Right Arm, Patient Position: Sitting, Cuff Size: Large)   Pulse 86   Ht 5\' 7"  (1.702 m)   Wt 266 lb 8 oz (120.9 kg)   LMP 06/22/2023   BMI 41.74 kg/m  UPT is negative    Skin warm and dry.Pelvic: external genitalia is normal in appearance no lesions, vagina: pink,urethra has no lesions or masses noted, cervix:smooth, no IUD strings seen, uterus: normal size, shape and contour, non tender, no masses felt, adnexa: no masses or tenderness noted. Bladder is non tender and no masses felt.  Upstream - 08/29/23 7829       Pregnancy Intention Screening   Does the patient want to become pregnant in the next year? No    Does the patient's partner want to become pregnant in the next year? No    Would the patient like  to discuss contraceptive options today? No      Contraception Wrap Up   Current Method IUD or IUS    End Method IUD or IUS    Contraception Counseling Provided Yes            Examination chaperoned by Malachy Mood LPN  Assessment:     1. Pregnancy examination or test, negative result  - POCT urine pregnancy  2. IUD check up   3. Intrauterine contraceptive device threads lost, initial encounter No strings seen [place] in office shows IUD in place, uterus and ovaries looked normal  - US PELVIC COMPLETE WITH TRANSVAGINAL; Future  4. Vaginal dryness Try replens  5. Hypertension, unspecified type Take BP meds and follow up with PCP  6. IUD (intrauterine device) in place Mirena placed 08/01/23  In place on IUD today in office    Plan:    She requested rx for valtrex 1 gm daily for suppression Meds ordered this encounter  Medications   valACYclovir (VALTREX) 1000 MG tablet    Sig: Take 1 tablet (1,000 mg total) by mouth daily.    Dispense:  30 tablet    Refill:  12    Order Specific Question:   Supervising Provider    Answer:   Lazaro Arms [[id number]]    Return 06/07/23 for pap and physical with me, or sooner if needed

## 2023-09-07 ENCOUNTER — Ambulatory Visit: Payer: Medicaid Other | Admitting: Nutrition

## 2023-09-08 ENCOUNTER — Encounter (HOSPITAL_COMMUNITY): Payer: Self-pay | Admitting: Psychiatry

## 2023-09-08 ENCOUNTER — Telehealth (HOSPITAL_COMMUNITY): Payer: Medicaid Other | Admitting: Psychiatry

## 2023-09-08 DIAGNOSIS — F411 Generalized anxiety disorder: Secondary | ICD-10-CM

## 2023-09-08 DIAGNOSIS — F633 Trichotillomania: Secondary | ICD-10-CM | POA: Diagnosis not present

## 2023-09-08 DIAGNOSIS — F4310 Post-traumatic stress disorder, unspecified: Secondary | ICD-10-CM | POA: Diagnosis not present

## 2023-09-08 DIAGNOSIS — F332 Major depressive disorder, recurrent severe without psychotic features: Secondary | ICD-10-CM | POA: Diagnosis not present

## 2023-09-08 DIAGNOSIS — F17290 Nicotine dependence, other tobacco product, uncomplicated: Secondary | ICD-10-CM | POA: Diagnosis not present

## 2023-09-08 DIAGNOSIS — G4700 Insomnia, unspecified: Secondary | ICD-10-CM | POA: Diagnosis not present

## 2023-09-08 DIAGNOSIS — G4709 Other insomnia: Secondary | ICD-10-CM

## 2023-09-08 DIAGNOSIS — F410 Panic disorder [episodic paroxysmal anxiety] without agoraphobia: Secondary | ICD-10-CM | POA: Diagnosis not present

## 2023-09-08 MED ORDER — MIRTAZAPINE 15 MG PO TABS: 15.0000 mg | ORAL_TABLET | Freq: Every day | ORAL | 2 refills | Status: DC

## 2023-09-08 MED ORDER — PROPRANOLOL HCL 10 MG PO TABS
10.0000 mg | ORAL_TABLET | Freq: Two times a day (BID) | ORAL | 2 refills | Status: DC | PRN
Start: 2023-09-08 — End: 2023-10-09
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
## 2023-09-08 NOTE — Patient Instructions (Signed)
We discontinued the hydroxyzine today in favor of starting propranolol 10 mg twice daily as needed for panic/anxiety.  I would take this about 15 minutes before you plan on going to anything that is a known trigger for your panic like a crowded event or places with loud noises.  We also started Remeron (mirtazapine) 15 mg nightly for your insomnia but it should also help with appetite and nausea.  As we increase the dose so we will act more and more like an antidepressant/antianxiety medicine.  I would still take it at the same 9 PM time like the hydroxyzine.

## 2023-09-13 ENCOUNTER — Encounter: Payer: Self-pay | Admitting: Family Medicine

## 2023-09-14 ENCOUNTER — Ambulatory Visit: Payer: Medicaid Other | Admitting: Family Medicine

## 2023-10-09 ENCOUNTER — Telehealth (INDEPENDENT_AMBULATORY_CARE_PROVIDER_SITE_OTHER): Payer: Medicaid Other | Admitting: Psychiatry

## 2023-10-09 ENCOUNTER — Telehealth (HOSPITAL_COMMUNITY): Payer: Self-pay | Admitting: Professional

## 2023-10-09 ENCOUNTER — Encounter (HOSPITAL_COMMUNITY): Payer: Self-pay | Admitting: Psychiatry

## 2023-10-09 DIAGNOSIS — F332 Major depressive disorder, recurrent severe without psychotic features: Secondary | ICD-10-CM

## 2023-10-09 DIAGNOSIS — F410 Panic disorder [episodic paroxysmal anxiety] without agoraphobia: Secondary | ICD-10-CM | POA: Diagnosis not present

## 2023-10-09 DIAGNOSIS — F4310 Post-traumatic stress disorder, unspecified: Secondary | ICD-10-CM | POA: Diagnosis not present

## 2023-10-09 DIAGNOSIS — G4709 Other insomnia: Secondary | ICD-10-CM | POA: Diagnosis not present

## 2023-10-09 DIAGNOSIS — F411 Generalized anxiety disorder: Secondary | ICD-10-CM | POA: Diagnosis not present

## 2023-10-09 DIAGNOSIS — F17290 Nicotine dependence, other tobacco product, uncomplicated: Secondary | ICD-10-CM | POA: Diagnosis not present

## 2023-10-09 MED ORDER — DULOXETINE HCL 30 MG PO CPEP
30.0000 mg | ORAL_CAPSULE | Freq: Every day | ORAL | 2 refills | Status: DC
Start: 2023-10-09 — End: 2023-11-10

## 2023-10-09 MED ORDER — MIRTAZAPINE 15 MG PO TABS
15.0000 mg | ORAL_TABLET | Freq: Every day | ORAL | 2 refills | Status: DC
Start: 2023-10-09 — End: 2023-12-25

## 2023-10-09 MED ORDER — PROPRANOLOL HCL 10 MG PO TABS
10.0000 mg | ORAL_TABLET | Freq: Two times a day (BID) | ORAL | 2 refills | Status: DC | PRN
Start: 2023-10-09 — End: 2024-02-02

## 2023-10-09 MED ORDER — DULOXETINE HCL 60 MG PO CPEP
60.0000 mg | ORAL_CAPSULE | Freq: Every day | ORAL | 2 refills | Status: DC
Start: 2023-10-09 — End: 2023-11-10

## 2023-10-09 NOTE — Patient Instructions (Addendum)
We increased the Remeron (mirtazapine) to 30 mg nightly today.  This should begin to help with your depression and anxiety a bit more.  It is okay to keep taking the propranolol as needed but if you need to take it scheduled it is okay to do that to.  I placed a referral to the partial hospitalization program so be on the lookout for a phone call from them.  If he feels that the suicidal ideation is getting worse there is a behavioral health urgent care in [place] that you can go to 24 hours a day 7 days a week: [email or web address]
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
## 2023-10-11 ENCOUNTER — Telehealth (HOSPITAL_COMMUNITY): Payer: Self-pay | Admitting: Professional

## 2023-10-16 ENCOUNTER — Ambulatory Visit: Payer: Medicaid Other

## 2023-10-25 ENCOUNTER — Telehealth (HOSPITAL_COMMUNITY): Payer: Self-pay | Admitting: Professional

## 2023-11-09 NOTE — Progress Notes (Signed)
 NEUROLOGY CONSULTATION NOTE  [name] MRN: [id number] DOB: Feb 05, 1986  Referring provider: Gloria Zarwolo, [name] Primary care provider: Gloria Zarwolo, [name]  Reason for consult:  migraines  Assessment/Plan:   Migraine with aura, without status migrainosus, intractable Obstructive sleep apnea Hypertension - elevated today.   Just took her BP medication prior to visit.   Migraine prevention:  Due to having to be on 4 antihypertensive medications for hypertension, I wouldn't restart Aimovig.  Instead, will start Emgality  Migraine rescue:  She will try samples of Nurtec; promethazine  25mg  for nausea Limit use of pain relievers to no more than 2 days out of week to prevent risk of rebound or medication-overuse headache. Keep headache diary CPAP Advised to contact PCP's office now and continue checking BP. Follow up 6 months.    Subjective:  [name] is a 38 year old left-handed female with HTN, PTSD, depression, PCOS, DM 2, and history of kidney stones who presents for migraines.  History supplemented by referring provider's note.  Onset:  38 years old (along with diagnosis of hypertension and OSA). Worse by 2019.   Location:  usually temples bilaterally, also behind ears bilaterally Quality:  pounding Intensity:  7-8/10.   Aura:  sees stars/sparkles in vision Prodrome:  absent. Associated symptoms:  Nausea, vomiting, photophobia, phonophobia, lightheaded.  She denies associated unilateral numbness or weakness. Duration:  4 days (subsides over 2 days with sumatriptan ; subsides 2-3 days with [place]) Frequency:  1 to 2 times a month (once every 2 to 3 months on Aimovig) Triggers:  unknown Relieving factors:  sleep, fresh air Activity:  cannot function  CT head on 12/04/2017 personally reviewed was normal.  Past NSAIDS/analgesics:  diclofenac  75mg , Excedrin Past abortive triptans:  sumatriptan  100mg  (effective), rizatriptan 10mg  Past abortive ergotamine:   none Past muscle relaxants:  tizanidine Past anti-emetic:  ondansetron , promethazine  (effective) Past antihypertensive medications:  metoprolol, carvedilol Past antidepressant medications:  bupropion , escitalopram  Past anticonvulsant medications:  topiramate, lamotrigine , gabapentin  Past anti-CGRP:  Ubrelvy 100mg  (effective), Aimovig 140mg  (1 dose - effective) Past vitamins/Herbal/Supplements:  none Past antihistamines/decongestants:  none Other past therapies:  none  Current NSAIDS/analgesics:  ibuprofen  800mg , Tylenol  Current triptans:  none Current ergotamine:  none Current anti-emetic:  none Current muscle relaxants:  baclofen 10mg  TID PRN Current Antihypertensive medications:  propranolol  10mg  BID PRN, lisinopril -hydrochlorothiazide , amlodipine , hydralazine  Current Antidepressant medications:  mirtazapine  15mg  at bedtime, duloxetine  90mg  daily Current Anticonvulsant medications:  lamotrigine  150mg  daily Current anti-CGRP:  Aimovig 140mg  Other therapy:  none Birth control:  Mirena    Caffeine:  1 cup coffee daily. Diet:  Water.  Sometimes ginger ale.  Skips meals Exercise:  Walks, work out at [employer], swimming Depression:  yes; Anxiety:  yes Sleep hygiene:  improved since starting mirtazapine  Family history of headache:  no      PAST MEDICAL HISTORY: 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    History of kidney stones    Hypertension    IUD (intrauterine device) in place  Migraines    Neuropathy    Obese    Osteoarthritis    left knee    Papanicolaou smear of cervix with positive high risk human papilloma virus (HPV) test 03/22/2021   03/22/21 repeat pap in 1 year per ASCCP guideline, 5 year risk for CIN 3+ is 2.25 %   Polycystic ovarian syndrome    PTSD (post-traumatic stress disorder)    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   Sleep apnea    Trichomonas contact, treated    Type 2 diabetes mellitus (HCC)    Vitamin D  deficiency    Yeast infection     PAST SURGICAL HISTORY: Past Surgical History:  Procedure Laterality Date   CESAREAN SECTION  09/24/2012   Procedure: CESAREAN SECTION;  Surgeon: Elveria Mungo, MD;  Location: [facility];  Service: Gynecology;  Laterality: N/A;   CHOLECYSTECTOMY     CHOLECYSTECTOMY N/A 04/05/2013   Procedure: LAPAROSCOPIC CHOLECYSTECTOMY;  Surgeon: Oneil DELENA Budge, MD;  Location: [facility];  Service: General;  Laterality: N/A;   TONSILLECTOMY      MEDICATIONS: Current Outpatient Medications on File Prior to Visit  Medication Sig Dispense Refill   acetaminophen  (TYLENOL ) 500 MG tablet Take 1,000 mg by mouth as needed.     AIMOVIG 140 MG/ML SOAJ      amLODipine  (NORVASC ) 10 MG tablet Take 1 tablet (10 mg total) by mouth daily. 90 tablet 1   baclofen (LIORESAL) 10 MG tablet Take 10 mg by mouth 3 (three) times daily as needed.     DULoxetine  (CYMBALTA ) 30 MG capsule Take 1 capsule (30 mg total) by mouth daily. Take with 60mg  capsule daily. 30 capsule 2   DULoxetine  (CYMBALTA ) 60 MG capsule Take 1 capsule (60 mg total) by mouth daily. Take with 30mg  capsule daily. 30 capsule 2   fluconazole  (DIFLUCAN ) 150 MG tablet TAKE ONE TABLET BY MOUTH NOW THEN CAN REPEAT IN 3 DAYS 2 tablet 11   gabapentin  (NEURONTIN ) 300 MG capsule Take 1 capsule (300 mg total) by mouth 3 (three) times daily. 90 capsule 0   hydrALAZINE  (APRESOLINE ) 10 MG tablet Take 1 tablet (10 mg total) by mouth 3 (three) times daily. 90 tablet 1   ibuprofen  (ADVIL ) 800 MG tablet Take 1 tablet (800 mg total) by mouth 3 (three) times  daily. 80 tablet 1   JARDIANCE 10 MG TABS tablet Take 10 mg by mouth daily.     lamoTRIgine  (LAMICTAL ) 150 MG tablet Take 1 tablet (150 mg total) by mouth daily. 90 tablet 0   levonorgestrel  (MIRENA ) 20 MCG/24HR IUD 1 each by Intrauterine route once.     lisinopril -hydrochlorothiazide  (ZESTORETIC ) 20-25 MG tablet Take 1 tablet by mouth daily. 60 tablet 1   mirtazapine  (REMERON ) 15 MG tablet Take 1 tablet (15 mg total) by mouth at bedtime. 30 tablet 2   nystatin  cream (MYCOSTATIN ) Apply 1 Application topically 2 (two) times daily. 30 g 11   propranolol  (INDERAL ) 10 MG tablet Take 1 tablet (10 mg total) by mouth 2 (two) times daily as needed (Anxiety/panic). 60 tablet 2   Semaglutide  (RYBELSUS ) 14 MG TABS Take 1 tablet (14 mg total) by mouth daily. 90 tablet 1   SUMAtriptan  (IMITREX ) 100 MG tablet Take 1 tablet (100 mg total) by mouth as needed. 10 tablet 0   triamcinolone  (KENALOG ) 0.025 % ointment Apply 1 Application topically 2 (two) times daily. 30 g 11   valACYclovir  (VALTREX ) 1000 MG tablet Take 1 tablet (1,000 mg total) by mouth daily. 30 tablet 12  No current facility-administered medications on file prior to visit.    ALLERGIES: No Known Allergies  FAMILY HISTORY: Family History  Problem Relation Age of Onset   Hypertension Mother    Diabetes Mother    Hypertension Father    Asthma Brother    Heart murmur Brother    Crohn's disease Brother    Diabetes Maternal Aunt    Hypertension Maternal Aunt    Diabetes Maternal Uncle    Congestive Heart Failure Maternal Uncle    Cancer Paternal Aunt        uterine   Diabetes Maternal Grandmother    Cancer - Lung Maternal Grandmother    Diabetes Maternal Grandfather    Hypertension Maternal Grandfather    Stroke Maternal Grandfather    COPD Paternal Grandfather     Objective:  Blood pressure (!) 190/113, pulse 81, height 5' 7 (1.702 m), weight 282 lb 3.2 oz (128 kg), SpO2 100%. General: No acute distress.  Patient appears  well-groomed.   Head:  Normocephalic/atraumatic Eyes:  fundi examined but not visualized Neck: supple, no paraspinal tenderness, full range of motion Back: No paraspinal tenderness Heart: regular rate and rhythm Lungs: Clear to auscultation bilaterally. Vascular: No carotid bruits. Neurological Exam: Mental status: alert and oriented to person, place, and time, speech fluent and not dysarthric, language intact. Cranial nerves: CN I: not tested CN II: pupils equal, round and reactive to light, visual fields intact CN III, IV, VI:  full range of motion, no nystagmus, no ptosis CN V: facial sensation intact. CN VII: upper and lower face symmetric CN VIII: hearing intact CN IX, X: gag intact, uvula midline CN XI: sternocleidomastoid and trapezius muscles intact CN XII: tongue midline Bulk & Tone: normal, no fasciculations. Motor:  muscle strength 5/5 throughout Sensation:  Pinprick and vibratory sensation intact. Deep Tendon Reflexes:  2+ throughout,  toes downgoing.   Finger to nose testing:  Without dysmetria.   Heel to shin:  Without dysmetria.   Gait:  Normal station and stride.  Romberg negative.    Thank you for allowing me to take part in the care of this patient.  Juliene Dunnings, DO  CC: Gloria Zarwolo, [name]

## 2023-11-10 ENCOUNTER — Ambulatory Visit: Payer: Medicaid Other | Admitting: Neurology

## 2023-11-10 ENCOUNTER — Encounter: Payer: Self-pay | Admitting: Neurology

## 2023-11-10 ENCOUNTER — Telehealth (HOSPITAL_COMMUNITY): Payer: Medicaid Other | Admitting: Psychiatry

## 2023-11-10 ENCOUNTER — Encounter (HOSPITAL_COMMUNITY): Payer: Self-pay | Admitting: Psychiatry

## 2023-11-10 VITALS — BP 190/113 | HR 81 | Ht 67.0 in | Wt 282.2 lb

## 2023-11-10 DIAGNOSIS — F17290 Nicotine dependence, other tobacco product, uncomplicated: Secondary | ICD-10-CM

## 2023-11-10 DIAGNOSIS — F4310 Post-traumatic stress disorder, unspecified: Secondary | ICD-10-CM | POA: Diagnosis not present

## 2023-11-10 DIAGNOSIS — I1A0 Resistant hypertension: Secondary | ICD-10-CM

## 2023-11-10 DIAGNOSIS — G43019 Migraine without aura, intractable, without status migrainosus: Secondary | ICD-10-CM | POA: Diagnosis not present

## 2023-11-10 DIAGNOSIS — G4709 Other insomnia: Secondary | ICD-10-CM

## 2023-11-10 DIAGNOSIS — F410 Panic disorder [episodic paroxysmal anxiety] without agoraphobia: Secondary | ICD-10-CM

## 2023-11-10 DIAGNOSIS — E559 Vitamin D deficiency, unspecified: Secondary | ICD-10-CM | POA: Diagnosis not present

## 2023-11-10 DIAGNOSIS — F332 Major depressive disorder, recurrent severe without psychotic features: Secondary | ICD-10-CM

## 2023-11-10 DIAGNOSIS — G4733 Obstructive sleep apnea (adult) (pediatric): Secondary | ICD-10-CM

## 2023-11-10 DIAGNOSIS — F411 Generalized anxiety disorder: Secondary | ICD-10-CM

## 2023-11-10 MED ORDER — DULOXETINE HCL 60 MG PO CPEP
120.0000 mg | ORAL_CAPSULE | Freq: Every day | ORAL | 2 refills | Status: DC
Start: 2023-11-10 — End: 2023-12-25

## 2023-11-10 MED ORDER — PROMETHAZINE HCL 25 MG PO TABS: 25.0000 mg | ORAL_TABLET | Freq: Four times a day (QID) | ORAL | 5 refills | Status: AC | PRN

## 2023-11-10 NOTE — Patient Instructions (Signed)
 We increased the Cymbalta  to 120 mg once daily today.  Keep making use of your safety plan and feel free to stay in contact with me if you are going through a rough time.  Please follow through with your nutrition referral as we still need to make sure that you are getting adequate nutrition with your appetite suppression while on the Rybelsus .

## 2023-11-10 NOTE — Patient Instructions (Signed)
  Start Emgality  - 2 injections for first dose, then 1 injection every 28 days thereafter.   Take Nurtec at earliest onset of headache.  Maximum 1 tablet in 24 hours.  Let me know if effective Promethazine  for nausea Limit use of pain relievers to no more than 2 days out of the week.  These medications include acetaminophen , NSAIDs (ibuprofen /Advil /Motrin , naproxen/Aleve, triptans (Imitrex /sumatriptan ), Excedrin, and narcotics.  This will help reduce risk of rebound headaches. Be aware of common food triggers: Routine exercise Stay adequately hydrated (aim for 64 oz water daily) Keep headache diary Maintain proper stress management Maintain proper sleep hygiene Do not skip meals Consider supplements:  magnesium citrate 400mg  daily, riboflavin 400mg  daily, coenzyme Q10 300mg  daily
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
## 2023-11-28 ENCOUNTER — Encounter (HOSPITAL_COMMUNITY): Payer: Self-pay

## 2023-12-12 ENCOUNTER — Telehealth (HOSPITAL_COMMUNITY): Payer: Medicaid Other | Admitting: Psychiatry

## 2023-12-12 NOTE — Telephone Encounter (Signed)
I called pt to r.s no answer vm full

## 2023-12-15 ENCOUNTER — Other Ambulatory Visit (HOSPITAL_COMMUNITY): Payer: Self-pay | Admitting: Psychiatry

## 2023-12-15 DIAGNOSIS — G4709 Other insomnia: Secondary | ICD-10-CM

## 2023-12-15 DIAGNOSIS — F332 Major depressive disorder, recurrent severe without psychotic features: Secondary | ICD-10-CM

## 2023-12-18 NOTE — Telephone Encounter (Signed)
 Send to Dr. Vladimir Groves

## 2023-12-25 ENCOUNTER — Encounter (HOSPITAL_COMMUNITY): Payer: Self-pay | Admitting: Psychiatry

## 2023-12-25 ENCOUNTER — Telehealth (HOSPITAL_COMMUNITY): Payer: Medicaid Other | Admitting: Psychiatry

## 2023-12-25 DIAGNOSIS — F332 Major depressive disorder, recurrent severe without psychotic features: Secondary | ICD-10-CM

## 2023-12-25 DIAGNOSIS — F633 Trichotillomania: Secondary | ICD-10-CM

## 2023-12-25 DIAGNOSIS — R45851 Suicidal ideations: Secondary | ICD-10-CM | POA: Diagnosis not present

## 2023-12-25 DIAGNOSIS — F17290 Nicotine dependence, other tobacco product, uncomplicated: Secondary | ICD-10-CM

## 2023-12-25 DIAGNOSIS — F4001 Agoraphobia with panic disorder: Secondary | ICD-10-CM

## 2023-12-25 DIAGNOSIS — G4709 Other insomnia: Secondary | ICD-10-CM

## 2023-12-25 DIAGNOSIS — F4310 Post-traumatic stress disorder, unspecified: Secondary | ICD-10-CM

## 2023-12-25 DIAGNOSIS — F411 Generalized anxiety disorder: Secondary | ICD-10-CM

## 2023-12-25 MED ORDER — DULOXETINE HCL 60 MG PO CPEP: 60.0000 mg | ORAL_CAPSULE | Freq: Every day | ORAL | 2 refills | Status: DC

## 2023-12-25 MED ORDER — DULOXETINE HCL 30 MG PO CPEP: 30.0000 mg | ORAL_CAPSULE | Freq: Every day | ORAL | 2 refills | Status: DC

## 2023-12-25 MED ORDER — LAMOTRIGINE 200 MG PO TABS: 200.0000 mg | ORAL_TABLET | Freq: Every day | ORAL | 2 refills | Status: DC

## 2023-12-25 MED ORDER — MIRTAZAPINE 30 MG PO TABS: 30.0000 mg | ORAL_TABLET | Freq: Every day | ORAL | 2 refills | Status: DC
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
## 2023-12-25 NOTE — Patient Instructions (Addendum)
We decreased the Cymbalta (duloxetine) to 90 mg once daily.  Take a 60 mg capsule with a 30 mg capsule once daily and do not take at night as this will interrupt her sleep at night.  We will also increase lamotrigine (Lamictal) to 200 mg nightly.  After taking this for 1 week you can increase the mirtazapine (Remeron) to 30 mg nightly.  As her work schedule allows please reach back out to the partial hospitalization program and you can find their phone number here for intensive therapy services: [email or web address]  If the suicidal ideation should worsen with intent or have plan please contact me immediately or present directly to the behavioral health urgent care in [place].

## 2024-01-15 ENCOUNTER — Encounter (HOSPITAL_COMMUNITY): Payer: Self-pay | Admitting: Psychiatry

## 2024-01-15 ENCOUNTER — Telehealth (INDEPENDENT_AMBULATORY_CARE_PROVIDER_SITE_OTHER): Payer: Medicaid Other | Admitting: Psychiatry

## 2024-01-15 DIAGNOSIS — F332 Major depressive disorder, recurrent severe without psychotic features: Secondary | ICD-10-CM | POA: Diagnosis not present

## 2024-01-15 DIAGNOSIS — F4001 Agoraphobia with panic disorder: Secondary | ICD-10-CM

## 2024-01-15 DIAGNOSIS — F411 Generalized anxiety disorder: Secondary | ICD-10-CM

## 2024-01-15 DIAGNOSIS — R45851 Suicidal ideations: Secondary | ICD-10-CM | POA: Diagnosis not present

## 2024-01-15 DIAGNOSIS — G4709 Other insomnia: Secondary | ICD-10-CM

## 2024-01-15 DIAGNOSIS — F4310 Post-traumatic stress disorder, unspecified: Secondary | ICD-10-CM | POA: Diagnosis not present
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
## 2024-01-15 NOTE — Patient Instructions (Addendum)
 We did not make any medication changes today.  If you need to take the propranolol 3 times a day as needed it is okay to do so.  If the suicidal ideation should worsen or start to have an plan or intent again you can let me know but we also may want to consider that partial hospitalization program or presenting to the urgent care in [place].  Here is that list of cognitive distortions that we were going over today: [email or web address]

## 2024-01-25 ENCOUNTER — Telehealth: Payer: Self-pay | Admitting: Neurology

## 2024-01-25 NOTE — Telephone Encounter (Signed)
 Pt called in and left a message. She is still waiting to hear back about her Nurtec PA, she is wondering how she can get some until then?

## 2024-01-25 NOTE — Telephone Encounter (Signed)
 Patient never called to advised she like the Nurtec so we can send in a script. Patient was given only samples not a script at her appt 11/10/23.   Tried calling patient no answer. VM full unable to LVM.

## 2024-01-25 NOTE — Telephone Encounter (Signed)
Pt called in and left a message. She is returning a call 

## 2024-01-26 ENCOUNTER — Telehealth: Payer: Self-pay | Admitting: Pharmacist

## 2024-01-26 ENCOUNTER — Other Ambulatory Visit: Payer: Self-pay | Admitting: Neurology

## 2024-01-26 MED ORDER — NURTEC 75 MG PO TBDP: 1.0000 | ORAL_TABLET | Freq: Every day | ORAL | 5 refills | Status: AC | PRN

## 2024-01-26 NOTE — Telephone Encounter (Signed)
 Pharmacy Patient Advocate Encounter  Received notification from [facility] that Prior Authorization for Nurtec 75MG  dispersible tablets has been APPROVED from 01/26/2024 to 01/25/2025   PA #/Case ID/Reference #: [id number]

## 2024-01-29 ENCOUNTER — Other Ambulatory Visit (HOSPITAL_COMMUNITY): Payer: Self-pay

## 2024-01-29 NOTE — Telephone Encounter (Signed)
 Pharmacy Patient Advocate Encounter  Received notification from [facility] that Prior Authorization for NURTEC 75MG  has been APPROVED from 3.21.25 to 3.21.26. Ran test claim, Copay is $4. This test claim was processed through [facility]- copay amounts may vary at other pharmacies due to pharmacy/plan contracts, or as the patient moves through the different stages of their insurance plan.   PA #/Case ID/Reference #: [id number]

## 2024-02-02 ENCOUNTER — Telehealth (HOSPITAL_COMMUNITY): Admitting: Psychiatry

## 2024-02-02 ENCOUNTER — Encounter (HOSPITAL_COMMUNITY): Payer: Self-pay | Admitting: Psychiatry

## 2024-02-02 DIAGNOSIS — F17290 Nicotine dependence, other tobacco product, uncomplicated: Secondary | ICD-10-CM

## 2024-02-02 DIAGNOSIS — F411 Generalized anxiety disorder: Secondary | ICD-10-CM | POA: Diagnosis not present

## 2024-02-02 DIAGNOSIS — F410 Panic disorder [episodic paroxysmal anxiety] without agoraphobia: Secondary | ICD-10-CM

## 2024-02-02 DIAGNOSIS — F4001 Agoraphobia with panic disorder: Secondary | ICD-10-CM

## 2024-02-02 DIAGNOSIS — F332 Major depressive disorder, recurrent severe without psychotic features: Secondary | ICD-10-CM

## 2024-02-02 DIAGNOSIS — G4709 Other insomnia: Secondary | ICD-10-CM | POA: Diagnosis not present

## 2024-02-02 DIAGNOSIS — F4310 Post-traumatic stress disorder, unspecified: Secondary | ICD-10-CM

## 2024-02-02 DIAGNOSIS — F633 Trichotillomania: Secondary | ICD-10-CM | POA: Diagnosis not present

## 2024-02-02 MED ORDER — PROPRANOLOL HCL 10 MG PO TABS: 10.0000 mg | ORAL_TABLET | Freq: Three times a day (TID) | ORAL | 2 refills | Status: DC | PRN

## 2024-02-02 MED ORDER — DULOXETINE HCL 60 MG PO CPEP: 60.0000 mg | ORAL_CAPSULE | Freq: Every day | ORAL | 2 refills | Status: DC

## 2024-02-02 MED ORDER — DULOXETINE HCL 30 MG PO CPEP
30.0000 mg | ORAL_CAPSULE | Freq: Every day | ORAL | 2 refills | Status: DC
Start: 2024-02-02 — End: 2024-03-26

## 2024-02-02 NOTE — Patient Instructions (Signed)
 We increased the propranolol to be taken 3 times a day as needed for anxiety and panic and I called in refills for the Cymbalta for taking the 30 mg with the 60 mg once daily.  Keep up the good work with trying to be consistent with meals and remember to treat yourself as a congratulations a celebration for the progress you have made over the last year.
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
## 2024-02-19 ENCOUNTER — Ambulatory Visit: Admitting: Family Medicine

## 2024-02-23 ENCOUNTER — Ambulatory Visit: Admitting: Family Medicine

## 2024-03-01 ENCOUNTER — Encounter (HOSPITAL_COMMUNITY): Payer: Self-pay | Admitting: Psychiatry

## 2024-03-01 ENCOUNTER — Telehealth (HOSPITAL_COMMUNITY): Admitting: Psychiatry

## 2024-03-01 DIAGNOSIS — E559 Vitamin D deficiency, unspecified: Secondary | ICD-10-CM | POA: Diagnosis not present

## 2024-03-01 DIAGNOSIS — F422 Mixed obsessional thoughts and acts: Secondary | ICD-10-CM

## 2024-03-01 DIAGNOSIS — G4709 Other insomnia: Secondary | ICD-10-CM | POA: Diagnosis not present

## 2024-03-01 DIAGNOSIS — F17290 Nicotine dependence, other tobacco product, uncomplicated: Secondary | ICD-10-CM | POA: Diagnosis not present

## 2024-03-01 DIAGNOSIS — F4310 Post-traumatic stress disorder, unspecified: Secondary | ICD-10-CM

## 2024-03-01 DIAGNOSIS — F633 Trichotillomania: Secondary | ICD-10-CM | POA: Diagnosis not present

## 2024-03-01 DIAGNOSIS — F332 Major depressive disorder, recurrent severe without psychotic features: Secondary | ICD-10-CM | POA: Diagnosis not present

## 2024-03-01 DIAGNOSIS — R41840 Attention and concentration deficit: Secondary | ICD-10-CM

## 2024-03-01 DIAGNOSIS — F4001 Agoraphobia with panic disorder: Secondary | ICD-10-CM | POA: Diagnosis not present

## 2024-03-01 DIAGNOSIS — F411 Generalized anxiety disorder: Secondary | ICD-10-CM

## 2024-03-01 MED ORDER — CLONIDINE HCL ER 0.1 MG PO TB12
0.1000 mg | ORAL_TABLET | Freq: Every day | ORAL | 2 refills | Status: DC
Start: 2024-03-01 — End: 2024-03-26
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
## 2024-03-01 NOTE — Patient Instructions (Signed)
 We added clonidine ER 0.1 mg nightly to your regimen today.  This should help with some of the inattention but as before I do think that the poor nutrition is a big contributor and the racing thoughts from anxiety as well.  Keep using your coping mechanisms they are working.

## 2024-03-22 ENCOUNTER — Other Ambulatory Visit: Payer: Self-pay

## 2024-03-22 ENCOUNTER — Encounter (HOSPITAL_COMMUNITY): Payer: Self-pay

## 2024-03-22 ENCOUNTER — Emergency Department (HOSPITAL_COMMUNITY)
Admission: EM | Admit: 2024-03-22 | Discharge: 2024-03-22 | Disposition: A | Discharge status: Home / Self Care | Attending: Emergency Medicine | Admitting: Emergency Medicine

## 2024-03-22 DIAGNOSIS — G43909 Migraine, unspecified, not intractable, without status migrainosus: Secondary | ICD-10-CM | POA: Insufficient documentation

## 2024-03-22 MED ORDER — KETOROLAC TROMETHAMINE 30 MG/ML IJ SOLN
15.0000 mg | Freq: Once | INTRAMUSCULAR | Status: AC
  Administered 2024-03-22: 15 mg via INTRAVENOUS
  Filled 2024-03-22: qty 1

## 2024-03-22 MED ORDER — PROCHLORPERAZINE EDISYLATE 10 MG/2ML IJ SOLN
10.0000 mg | Freq: Once | INTRAMUSCULAR | Status: AC
  Administered 2024-03-22: 10 mg via INTRAVENOUS
  Filled 2024-03-22: qty 2

## 2024-03-22 MED ORDER — LACTATED RINGERS IV BOLUS
500.0000 mL | Freq: Once | INTRAVENOUS | Status: AC
Start: 2024-03-22 — End: 2024-03-22
  Administered 2024-03-22: 500 mL via INTRAVENOUS

## 2024-03-22 MED ORDER — DIPHENHYDRAMINE HCL 50 MG/ML IJ SOLN
25.0000 mg | Freq: Once | INTRAMUSCULAR | Status: AC
  Administered 2024-03-22: 25 mg via INTRAVENOUS
  Filled 2024-03-22: qty 1

## 2024-03-22 MED ORDER — DEXAMETHASONE SODIUM PHOSPHATE 10 MG/ML IJ SOLN
10.0000 mg | Freq: Once | INTRAMUSCULAR | Status: AC
  Administered 2024-03-22: 10 mg via INTRAVENOUS
  Filled 2024-03-22: qty 1

## 2024-03-22 MED ORDER — DROPERIDOL 2.5 MG/ML IJ SOLN
1.2500 mg | Freq: Once | INTRAMUSCULAR | Status: AC
  Administered 2024-03-22: 1.25 mg via INTRAVENOUS
  Filled 2024-03-22: qty 2

## 2024-03-22 NOTE — ED Triage Notes (Signed)
 Pt arrived from home c/o migraine x 3 days 10/10 pain took 75mg  neurotec and 25mg  phenergan  both at 1230pm 03/21/2024 with no relief. Pt states that there is a lot of pressure behind her eyes,

## 2024-03-24 NOTE — ED Provider Notes (Signed)
 [place] EMERGENCY DEPARTMENT AT [facility] Provider Note   CSN: [id number] Arrival date & time: 03/22/24  0865     History  Chief Complaint  Patient presents with   Migraine    [name] is a 38 y.o. female.  38 year old female presents ER today with a migraine.  Patient states he has a history of migraines.  She sees a neurologist.  She states that she took Nurtec today which helped a little bit but did not go away.  She had to come to the ER 1 time before for this many years ago.  She has had imaging done previously.  She states this is the same migraine she always has behind her eyes.  No neurologic, vision, balance changes.  No recent illnesses.  No fevers.  No neck pain.   Migraine       Home Medications Prior to Admission medications   Medication Sig Start Date End Date Taking? Authorizing Provider  promethazine  (PHENERGAN ) 25 MG tablet Take 1 tablet (25 mg total) by mouth every 6 (six) hours as needed for nausea or vomiting. 11/10/23  Yes Jaffe, Adam R, DO  Rimegepant Sulfate (NURTEC) 75 MG TBDP Take 75 mg by mouth daily as needed (migraine). 01/26/24  Yes [provider]  acetaminophen  (TYLENOL ) 500 MG tablet Take 1,000 mg by mouth as needed.    [provider]  AIMOVIG 140 MG/ML SOAJ  02/01/23   [provider]  amLODipine  (NORVASC ) 10 MG tablet Take 1 tablet (10 mg total) by mouth daily. 07/12/23   Zarwolo, Gloria, [name]  baclofen (LIORESAL) 10 MG tablet Take 10 mg by mouth 3 (three) times daily as needed. 11/10/20   [provider]  cloNIDine  HCl (KAPVAY ) 0.1 MG TB12 ER tablet Take 1 tablet (0.1 mg total) by mouth at bedtime. 03/01/24   Madie Schilling, MD  DULoxetine  (CYMBALTA ) 30 MG capsule Take 1 capsule (30 mg total) by mouth daily. Take with 60mg  once daily. 02/02/24   Madie Schilling, MD  DULoxetine  (CYMBALTA ) 60 MG capsule Take 1 capsule (60 mg total) by mouth daily. Take with 30mg  once daily. 02/02/24   Madie Schilling, MD  fluconazole  (DIFLUCAN ) 150 MG tablet TAKE ONE TABLET BY MOUTH NOW THEN CAN REPEAT IN 3 DAYS 06/06/23   Wendelyn Halter, MD  gabapentin  (NEURONTIN ) 300 MG capsule Take 1 capsule (300 mg total) by mouth 3 (three) times daily. Patient not taking: Reported on 11/10/2023 04/28/21 02/06/23  Wellington Half, [name]  hydrALAZINE  (APRESOLINE ) 10 MG tablet Take 1 tablet (10 mg total) by mouth 3 (three) times daily. 08/14/23   Zarwolo, Gloria, [name]  ibuprofen  (ADVIL ) 800 MG tablet Take 1 tablet (800 mg total) by mouth 3 (three) times daily. 08/14/23   Zarwolo, Gloria, [name]  JARDIANCE 10 MG TABS tablet Take 10 mg by mouth daily. 02/02/21   [provider]  lamoTRIgine  (LAMICTAL ) 200 MG tablet Take 1 tablet (200 mg total) by mouth daily. 12/25/23   Madie Schilling, MD  levonorgestrel  (MIRENA ) 20 MCG/24HR IUD 1 each by Intrauterine route once.    [provider]  lisinopril -hydrochlorothiazide  (ZESTORETIC ) 20-25 MG tablet Take 1 tablet by mouth daily. 02/06/23   Lillia Reilly, MD  mirtazapine  (REMERON ) 30 MG tablet Take 1 tablet (30 mg total) by mouth at bedtime. 12/25/23   Madie Schilling, MD  nystatin  cream (MYCOSTATIN ) Apply 1 Application topically 2 (two) times daily. 06/06/23   Wendelyn Halter, MD  propranolol  (INDERAL ) 10 MG tablet Take 1 tablet (10 mg total) by mouth 3 (three) times daily as needed (Anxiety/panic). 02/02/24   Madie Schilling, MD  Rimegepant Sulfate (NURTEC) 75 MG TBDP Take 1 tablet (75 mg total) by mouth daily as needed. 01/26/24   Merriam Abbey, DO  Semaglutide  (RYBELSUS ) 14 MG TABS Take 1 tablet (14 mg total) by mouth daily. 07/12/23   Zarwolo, Gloria, [name]  SUMAtriptan  (IMITREX ) 100 MG tablet Take 1 tablet (100 mg total) by mouth as needed. 06/14/23   Zarwolo, Gloria, [name]  triamcinolone  (KENALOG ) 0.025 % ointment Apply 1 Application topically 2 (two) times daily. 06/06/23   Wendelyn Halter, MD  valACYclovir  (VALTREX ) 1000 MG tablet Take 1 tablet (1,000 mg total) by mouth  daily. 08/29/23   Javan Messing, NP      Allergies    Patient has no known allergies.    Review of Systems   Review of Systems  Physical Exam Updated Vital Signs BP 128/69   Pulse 68   Temp 98 F (36.7 C) (Oral)   Resp 16   Ht 5\' 7"  (1.702 m)   Wt 121.1 kg   LMP 02/20/2024 (Approximate)   SpO2 98%   BMI 41.82 kg/m  Physical Exam Vitals and nursing note reviewed.  Constitutional:      Appearance: She is well-developed.  HENT:     Head: Normocephalic and atraumatic.  Cardiovascular:     Rate and Rhythm: Normal rate and regular rhythm.  Pulmonary:     Effort: No respiratory distress.     Breath sounds: No stridor.  Abdominal:     General: There is no distension.  Musculoskeletal:     Cervical back: Normal range of motion.  Neurological:     Mental Status: She is alert.     Comments: No altered mental status, able to give full seemingly accurate history.  Face is symmetric, EOM's intact, pupils equal and reactive, vision intact, tongue and uvula midline without deviation. Upper and Lower extremity motor 5/5, intact pain perception in distal extremities, 2+ reflexes in biceps, patella and achilles tendons. Able to perform finger to nose normal with both hands.     ED Results / Procedures / Treatments   Labs (all labs ordered are listed, but only abnormal results are displayed) Labs Reviewed - No data to display  EKG None  Radiology No results found.  Procedures Procedures    Medications Ordered in ED Medications  prochlorperazine  (COMPAZINE ) injection 10 mg (10 mg Intravenous Given 03/22/24 0447)  diphenhydrAMINE  (BENADRYL ) injection 25 mg (25 mg Intravenous Given 03/22/24 0446)  dexamethasone  (DECADRON ) injection 10 mg (10 mg Intravenous Given 03/22/24 0447)  lactated ringers  bolus 500 mL (0 mLs Intravenous Stopped 03/22/24 0548)  ketorolac  (TORADOL ) 30 MG/ML injection 15 mg (15 mg Intravenous Given 03/22/24 0447)  droperidol  (INAPSINE ) 2.5 MG/ML  injection 1.25 mg (1.25 mg Intravenous Given 03/22/24 0454)    ED Course/ Medical Decision Making/ A&P                                 Medical Decision Making Risk Prescription drug management.   Patient with a typical migraine.  No red flags or atypical features compared to her baseline.  Treated with a headache cocktail and significantly improved.  Patient neurologically intact at time of discharge.  Will follow-up with neurology if her MRIs become more frequent or change in any way she  will come back here. D/c to care of mother.   Final Clinical Impression(s) / ED Diagnoses Final diagnoses:  Migraine without status migrainosus, not intractable, unspecified migraine type    Rx / DC Orders ED Discharge Orders     None         Dia Donate, Reymundo Caulk, MD 03/24/24 [phone]

## 2024-03-25 DIAGNOSIS — R519 Headache, unspecified: Secondary | ICD-10-CM | POA: Diagnosis not present

## 2024-03-25 DIAGNOSIS — E669 Obesity, unspecified: Secondary | ICD-10-CM | POA: Diagnosis not present

## 2024-03-25 DIAGNOSIS — I10 Essential (primary) hypertension: Secondary | ICD-10-CM | POA: Diagnosis not present

## 2024-03-25 DIAGNOSIS — G43909 Migraine, unspecified, not intractable, without status migrainosus: Secondary | ICD-10-CM | POA: Diagnosis not present

## 2024-03-25 DIAGNOSIS — Z6841 Body Mass Index (BMI) 40.0 and over, adult: Secondary | ICD-10-CM | POA: Diagnosis not present

## 2024-03-26 ENCOUNTER — Encounter (HOSPITAL_COMMUNITY): Payer: Self-pay

## 2024-03-26 ENCOUNTER — Telehealth (INDEPENDENT_AMBULATORY_CARE_PROVIDER_SITE_OTHER): Payer: Self-pay | Admitting: Psychiatry

## 2024-03-26 ENCOUNTER — Telehealth: Payer: Self-pay | Admitting: Neurology

## 2024-03-26 DIAGNOSIS — G43019 Migraine without aura, intractable, without status migrainosus: Secondary | ICD-10-CM

## 2024-03-26 DIAGNOSIS — Z91199 Patient's noncompliance with other medical treatment and regimen due to unspecified reason: Secondary | ICD-10-CM

## 2024-03-26 MED ORDER — CLONIDINE HCL ER 0.1 MG PO TB12: 0.1000 mg | ORAL_TABLET | Freq: Every day | ORAL | 5 refills | Status: AC

## 2024-03-26 MED ORDER — EMGALITY 120 MG/ML ~~LOC~~ SOAJ: 120.0000 mg | SUBCUTANEOUS | 11 refills | Status: DC

## 2024-03-26 MED ORDER — PROPRANOLOL HCL 10 MG PO TABS: 10.0000 mg | ORAL_TABLET | Freq: Three times a day (TID) | ORAL | 5 refills | Status: AC | PRN

## 2024-03-26 MED ORDER — DULOXETINE HCL 60 MG PO CPEP: 60.0000 mg | ORAL_CAPSULE | Freq: Every day | ORAL | 5 refills | Status: AC

## 2024-03-26 MED ORDER — MIRTAZAPINE 30 MG PO TABS: 30.0000 mg | ORAL_TABLET | Freq: Every day | ORAL | 5 refills | Status: AC

## 2024-03-26 MED ORDER — DULOXETINE HCL 30 MG PO CPEP
30.0000 mg | ORAL_CAPSULE | Freq: Every day | ORAL | 5 refills | Status: AC
Start: 2024-03-26 — End: ?

## 2024-03-26 MED ORDER — EMGALITY 120 MG/ML ~~LOC~~ SOAJ: 240.0000 mg | Freq: Once | SUBCUTANEOUS | 0 refills | Status: AC

## 2024-03-26 MED ORDER — LAMOTRIGINE 200 MG PO TABS: 200.0000 mg | ORAL_TABLET | Freq: Every day | ORAL | 5 refills | Status: AC

## 2024-03-26 MED ORDER — PREDNISONE 10 MG (21) PO TBPK: ORAL_TABLET | ORAL | 0 refills | Status: DC

## 2024-03-26 NOTE — Telephone Encounter (Signed)
 Plan was to start Emgality.  We need to start a new prior auth.  Put prescription for starting dose (Emgality 240mg  once.  Loading dose.  Qty 2 mL.  Refills 0.  Then enter standing order Emgality 120mg  every 28 days.  Refill 5.    Also we can give her a prednisone taper.

## 2024-03-26 NOTE — Telephone Encounter (Signed)
 Pt. Was told that an injection for headaches would be called in for her and she is checking for update

## 2024-03-26 NOTE — Telephone Encounter (Signed)
 Patient states she is on day five of a migraine.  How frequent or the headaches (on average, how many days a week/month are they occurring)?   How long do the headaches last?  Five Days Verify what preventative medication and dose you are taking (e.g. topiramate, propranolol , amitriptyline, Emgality, etc)  NONE, Need a PA for [employer] which rescue medication you are taking (triptan, Advil , Excedrin, Aleve, Ubrelvy, etc)  Nurtec only allowed for five days.  How often are you taking pain relievers/analgesics/rescue mediction?  NONE   Please advise.     PA team please start a PA for Emgality.

## 2024-03-26 NOTE — Progress Notes (Signed)
 Patient no show.  From chart review presented to the emergency department in the setting of hypertensive urgency with status migrainosus.  In future appointments may need to consider decreasing the dose of Cymbalta  or discontinuing altogether due to the above.  Refills sent for her medications.

## 2024-03-27 NOTE — Telephone Encounter (Signed)
 PA needed for [employer]

## 2024-03-28 ENCOUNTER — Telehealth: Payer: Self-pay | Admitting: Cardiovascular Disease

## 2024-03-28 NOTE — Telephone Encounter (Signed)
 Pt c/o BP issue: STAT if pt c/o blurred vision, one-sided weakness or slurred speech.  STAT if BP is GREATER than 180/120 TODAY.  STAT if BP is LESS than 90/60 and SYMPTOMATIC TODAY  1. What is your BP concern? High BP  2. Have you taken any BP medication today? Yes  3. What are your last 5 BP readings? Today's BP readings 197/125 12PM "just stated high"  4. Are you having any other symptoms (ex. Dizziness, headache, blurred vision, passed out)? Headaches  Pt disconnect the line while I was calling [facility]. Please advise.

## 2024-03-28 NOTE — Telephone Encounter (Signed)
 Spoke with patient regarding blood pressure  Per patient her blood pressure has been elevated over the last week, has had migraine, nausea, and vomiting.  She has been to ED regarding migraine/blood pressure ED 5/16 blood pressure 128/69  Per ED visit she was to follow up with neurology, advised patient to follow up with them regarding migraines.  Patient last seen April 2023, was a no show for her 1 months follow up visit Stated she was taking all her medications as prescribed  When was reviewing medications with patient several Hydralazine  had been increased and Lisinopril /hct d/c. Both changes made by PCP  Saw PCP in January who advised she follow up with Adv HTN clinic, patient just calling today  PCP has made multiple changes in medications, advised to follow up with PCP regarding blood pressure being elevated  Did schedule her follow up visit in ADV HTN but strongly advised to call PCP for her elevated blood pressure until seen  Dr Theodis Fiscal aware of recommendations

## 2024-03-28 NOTE — Telephone Encounter (Signed)
 Route to [facility] due to HTN Clinic patient.

## 2024-03-29 ENCOUNTER — Ambulatory Visit: Payer: Self-pay

## 2024-03-29 ENCOUNTER — Telehealth: Payer: Self-pay

## 2024-03-29 ENCOUNTER — Other Ambulatory Visit (HOSPITAL_COMMUNITY): Payer: Self-pay

## 2024-03-29 NOTE — Telephone Encounter (Signed)
 Pharmacy Patient Advocate Encounter   Received notification from Pt Calls Messages that prior authorization for Emgality 120MG /ML auto-injectors (migraine) is required/requested.   Insurance verification completed.   The patient is insured through [facility] .   Per test claim: PA required; PA submitted to above mentioned insurance via CoverMyMeds Key/confirmation #/EOC [id number] Status is pending

## 2024-03-29 NOTE — Telephone Encounter (Signed)
  Chief Complaint: High BP reading. Severe HA Symptoms: above Frequency: today Pertinent Negatives: Patient denies  Disposition: [x] ED /[] Urgent Care (no appt availability in office) / [] Appointment(In office/virtual)/ []  [facility]/ [] Home Care/ [] Refused Recommended Disposition /[] [facility]/ []  Follow-up with PCP Additional Notes: Pt has a hx of hypertension. Pt was recently seen at ED for this as well. Pt has not missed any HTN medication. Pt reporting severe HA. Pt will go to ED for care.    Copied from CRM [id number]. Topic: Clinical - Red Word Triage >> Mar 29, 2024 10:27 AM Rosamond Comes wrote: Red Word that prompted transfer to Nurse Triage: patient in ED twice this week, High BP  206/106 headache. Reason for Disposition  [1] Systolic BP  >= 160 OR Diastolic >= 100 AND [2] cardiac (e.g., breathing difficulty, chest pain) or neurologic symptoms (e.g., new-onset blurred or double vision, unsteady gait)  Answer Assessment - Initial Assessment Questions 1. BLOOD PRESSURE: "What is the blood pressure?" "Did you take at least two measurements 5 minutes apart?"     206/106 2. ONSET: "When did you take your blood pressure?"     10am 3. HOW: "How did you take your blood pressure?" (e.g., automatic home BP monitor, visiting nurse)     Home unit 4. HISTORY: "Do you have a history of high blood pressure?"     yes 5. MEDICINES: "Are you taking any medicines for blood pressure?" "Have you missed any doses recently?"     Yes - has not missed 6. OTHER SYMPTOMS: "Do you have any symptoms?" (e.g., blurred vision, chest pain, difficulty breathing, headache, weakness)     HA  Protocols used: Blood Pressure - High-A-AH

## 2024-03-29 NOTE — Telephone Encounter (Signed)
 Pharmacy Patient Advocate Encounter  Received notification from [facility] that Prior Authorization for Emgality 120MG /ML auto-injectors (migraine) has been APPROVED from 03-29-2024 to 06-27-2024   PA #/Case ID/Reference #: [id number]

## 2024-04-04 ENCOUNTER — Ambulatory Visit: Admitting: Family Medicine

## 2024-04-05 ENCOUNTER — Other Ambulatory Visit (HOSPITAL_COMMUNITY): Payer: Self-pay

## 2024-04-11 ENCOUNTER — Ambulatory Visit: Admitting: Family Medicine

## 2024-04-24 ENCOUNTER — Telehealth: Payer: Self-pay | Admitting: Family Medicine

## 2024-04-24 NOTE — Telephone Encounter (Signed)
 LVM for pt to call back and schedule DRS

## 2024-04-25 ENCOUNTER — Ambulatory Visit: Admitting: Family Medicine

## 2024-04-26 ENCOUNTER — Telehealth: Payer: Self-pay

## 2024-04-26 NOTE — Telephone Encounter (Signed)
 PA needed for [employer]

## 2024-04-30 ENCOUNTER — Other Ambulatory Visit (HOSPITAL_COMMUNITY): Payer: Self-pay

## 2024-04-30 NOTE — Telephone Encounter (Signed)
 Pt still has an active Pa until 8.21.25. Called [facility] for them to process, but they need a new prescription before they can fill. Please send in a new prescription.

## 2024-05-01 MED ORDER — EMGALITY 120 MG/ML ~~LOC~~ SOAJ: 120.0000 mg | SUBCUTANEOUS | 11 refills | Status: AC

## 2024-05-01 NOTE — Telephone Encounter (Signed)
 Two new scripts sent 03/26/24

## 2024-05-02 ENCOUNTER — Other Ambulatory Visit (HOSPITAL_COMMUNITY): Payer: Self-pay

## 2024-05-02 ENCOUNTER — Telehealth: Payer: Self-pay

## 2024-05-02 NOTE — Telephone Encounter (Signed)
 Pharmacy Patient Advocate Encounter   Received notification from CoverMyMeds that prior authorization for Nurtec 75MG  dispersible tablets is required/requested.   Insurance verification completed.   The patient is insured through [facility] .   Per test claim: The current 30** day co-pay is, $4.00.  No PA needed at this time. This test claim was processed through [facility]- copay amounts may vary at other pharmacies due to pharmacy/plan contracts, or as the patient moves through the different stages of their insurance plan.    **Quantity of 8 tablets per 30 days

## 2024-05-07 ENCOUNTER — Encounter (HOSPITAL_BASED_OUTPATIENT_CLINIC_OR_DEPARTMENT_OTHER): Payer: Self-pay

## 2024-05-08 ENCOUNTER — Ambulatory Visit (HOSPITAL_BASED_OUTPATIENT_CLINIC_OR_DEPARTMENT_OTHER): Admitting: Family

## 2024-05-08 ENCOUNTER — Encounter (HOSPITAL_BASED_OUTPATIENT_CLINIC_OR_DEPARTMENT_OTHER): Payer: Self-pay | Admitting: Family

## 2024-05-08 VITALS — BP 160/90 | HR 84 | Ht 67.0 in | Wt 278.0 lb

## 2024-05-08 DIAGNOSIS — I1A0 Resistant hypertension: Secondary | ICD-10-CM

## 2024-05-08 DIAGNOSIS — G4733 Obstructive sleep apnea (adult) (pediatric): Secondary | ICD-10-CM | POA: Diagnosis not present

## 2024-05-08 DIAGNOSIS — E1142 Type 2 diabetes mellitus with diabetic polyneuropathy: Secondary | ICD-10-CM

## 2024-05-08 MED ORDER — HYDRALAZINE HCL 25 MG PO TABS
25.0000 mg | ORAL_TABLET | Freq: Three times a day (TID) | ORAL | 3 refills | Status: DC
Start: 2024-05-08 — End: 2024-05-31

## 2024-05-08 MED ORDER — AMLODIPINE BESYLATE 10 MG PO TABS: 10.0000 mg | ORAL_TABLET | Freq: Every day | ORAL | 1 refills | Status: AC

## 2024-05-08 NOTE — Patient Instructions (Signed)
 Medication Instructions:  Your physician has recommended you make the following change in your medication:   Change Hydralazine  25mg  three times daily   We have sent refills of amlodipine     Follow-Up: Please follow up in 6-8 weeks in [facility]N CLINIC with Dr. Raford, Reche Finder, NP or Allean Mink PharmD    Special Instructions:  Reche will reach out to you in 2 weeks to check on BP

## 2024-05-08 NOTE — Progress Notes (Signed)
 [facility] Assessment:    Date:  05/08/2024   ID:  [name], DOB 04/19/1986, MRN [id number]  PCP:  Zarwolo, Gloria, [name]  Cardiologist:  Jayson Sierras, MD  Nephrologist:  Referring MD: Zarwolo, Gloria, [name]   CC: Hypertension  History of Present Illness:    [name] is a 38 y.o. female with a hx of hypertension, sleep apnea on CPAP, PCOS, DM 2 here to follow up in the [facility].   CT ABD/pelvis 2012 negative for renal artery stenosis or fibromuscular dysplasia.  No adenomas at that time.  Echo 09/2021 LVEF 55 to 60%, mild LVH, normal diastolic function.  Established with [facility] 11/25/2021.  Diagnosis of hypertension at 38 years old in setting of PCOS.  Hypertension in both parents.  Previous tobacco use having quit in 2022.  Renin aldosteron, catecholamines and metanephrines , cortisol and TSH unremarkable at initial consult.   Discussed the use of AI scribe software for clinical note transcription with the patient, who gave verbal consent to proceed.  History of Present Illness [name] is a 38 year old female with hypertension and anxiety who presents with elevated blood pressure and associated symptoms.  She has experienced elevated blood pressure since October, with readings as high as 185/138 mmHg. Despite medication adjustments, her blood pressure remains uncontrolled. 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 Her current regimen includes clonidine  at bedtime, hydralazine  three times a day, and amlodipine  in the morning. She monitors her blood pressure at home using a [employer] device.  She experiences chest pain associated with high blood pressure and anxiety. The chest pain is described as a burning  sensation, similar to 'when you were a kid and you ran too fast.' Chest pain also occurs when anxious or crying, accompanied by shortness of breath and hyperventilation.  She does note anxiety particularly in situations such as attending church or large gatherings. Using Propranolol  PRN for anxiety.  She has diabetes and experiences neuropathy, which sometimes disrupts her sleep. She manages this with 300 mg of gabapentin  and uses compression socks to alleviate symptoms.   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    History of kidney stones    Hypertension    IUD (intrauterine device) in place    Migraines    Neuropathy    Obese    Osteoarthritis    left knee   Papanicolaou smear of cervix with positive high risk human papilloma virus (HPV) test 03/22/2021   03/22/21 repeat pap in 1 year per ASCCP guideline, 5 year risk for CIN 3+ is 2.25 %   Polycystic ovarian syndrome    PTSD (post-traumatic stress disorder)    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   Sleep apnea    Trichomonas contact,  treated    Type 2 diabetes mellitus (HCC)    Vitamin D  deficiency    Yeast infection     Past Surgical History:  Procedure Laterality Date   CESAREAN SECTION  09/24/2012   Procedure: CESAREAN SECTION;  Surgeon: Elveria Mungo, MD;  Location: [facility];  Service: Gynecology;  Laterality: N/A;   CHOLECYSTECTOMY     CHOLECYSTECTOMY N/A  04/05/2013   Procedure: LAPAROSCOPIC CHOLECYSTECTOMY;  Surgeon: Oneil DELENA Budge, MD;  Location: [facility];  Service: General;  Laterality: N/A;   TONSILLECTOMY      Current Medications: Current Meds  Medication Sig   acetaminophen  (TYLENOL ) 500 MG tablet Take 1,000 mg by mouth as needed.   amLODipine  (NORVASC ) 10 MG tablet Take 1 tablet (10 mg total) by mouth daily.   baclofen (LIORESAL) 10 MG tablet Take 10 mg by mouth 3 (three) times daily as needed.   cloNIDine  HCl (KAPVAY ) 0.1 MG TB12 ER tablet Take 1 tablet (0.1 mg total) by mouth at bedtime.   DULoxetine  (CYMBALTA ) 30 MG capsule Take 1 capsule (30 mg total) by mouth daily. Take with 60mg  once daily.   DULoxetine  (CYMBALTA ) 60 MG capsule Take 1 capsule (60 mg total) by mouth daily. Take with 30mg  once daily.   fluconazole  (DIFLUCAN ) 150 MG tablet TAKE ONE TABLET BY MOUTH NOW THEN CAN REPEAT IN 3 DAYS   gabapentin  (NEURONTIN ) 300 MG capsule Take 1 capsule (300 mg total) by mouth 3 (three) times daily.   Galcanezumab -gnlm (EMGALITY ) 120 MG/ML SOAJ Inject 120 mg into the skin every 28 (twenty-eight) days.   hydrALAZINE  (APRESOLINE ) 10 MG tablet Take 1 tablet (10 mg total) by mouth 3 (three) times daily.   ibuprofen  (ADVIL ) 800 MG tablet Take 1 tablet (800 mg total) by mouth 3 (three) times daily.   lamoTRIgine  (LAMICTAL ) 200 MG tablet Take 1 tablet (200 mg total) by mouth daily.   levonorgestrel  (MIRENA ) 20 MCG/24HR IUD 1 each by Intrauterine route once.   mirtazapine  (REMERON ) 30 MG tablet Take 1 tablet (30 mg total) by mouth at bedtime.   nystatin  cream (MYCOSTATIN ) Apply 1 Application topically 2 (two) times daily.   predniSONE  (STERAPRED UNI-PAK 21 TAB) 10 MG (21) TBPK tablet take 60mg  day 1, then 50mg  day 2, then 40mg  day 3, then 30mg  day 4, then 20mg  day 5, then 10mg  day 6, then STOP   promethazine  (PHENERGAN ) 25 MG tablet Take 1 tablet (25 mg total) by mouth every 6 (six) hours as needed for nausea or vomiting.   propranolol  (INDERAL ) 10  MG tablet Take 1 tablet (10 mg total) by mouth 3 (three) times daily as needed (Anxiety/panic).   Rimegepant Sulfate (NURTEC) 75 MG TBDP Take 1 tablet (75 mg total) by mouth daily as needed.   Semaglutide  (RYBELSUS ) 14 MG TABS Take 1 tablet (14 mg total) by mouth daily.   SUMAtriptan  (IMITREX ) 100 MG tablet Take 1 tablet (100 mg total) by mouth as needed.   triamcinolone  (KENALOG ) 0.025 % ointment Apply 1 Application topically 2 (two) times daily.   valACYclovir  (VALTREX ) 1000 MG tablet Take 1 tablet (1,000 mg total) by mouth daily.     Allergies:   Patient has no known allergies.   Social History   Socioeconomic History   Marital status: Single    Spouse name: Not on file   Number of children: 1   Years of education: Not on file   Highest education level: 12th grade  Occupational History   Not on file  Tobacco Use  Smoking status: Some Days    Types: E-cigarettes   Smokeless tobacco: Never   Tobacco comments:    Vape cartridge lasts about a month at a time  Vaping Use   Vaping status: Some Days  Substance and Sexual Activity   Alcohol use: Yes    Comment: Infrequent lately 1 unit of alcohol at the time   Drug use: No   Sexual activity: Yes    Birth control/protection: I.U.D.    Comment: female partner  Other Topics Concern   Not on file  Social History Narrative   Left handed   Social Drivers of Health   Financial Resource Strain: Patient Declined (06/06/2023)   Overall Financial Resource Strain (CARDIA)    Difficulty of Paying Living Expenses: Patient declined  Food Insecurity: No Food Insecurity (06/06/2023)   Hunger Vital Sign    Worried About Running Out of Food in the Last Year: Never true    Ran Out of Food in the Last Year: Never true  Transportation Needs: No Transportation Needs (06/06/2023)   PRAPARE - Administrator, Civil Service (Medical): No    Lack of Transportation (Non-Medical): No  Physical Activity: Sufficiently Active (06/06/2023)    Exercise Vital Sign    Days of Exercise per Week: 7 days    Minutes of Exercise per Session: 30 min  Stress: Stress Concern Present (06/06/2023)   [employer] of Occupational Health - Occupational Stress Questionnaire    Feeling of Stress : To some extent  Social Connections: Moderately Integrated (06/06/2023)   Social Connection and Isolation Panel    Frequency of Communication with Friends and Family: More than three times a week    Frequency of Social Gatherings with Friends and Family: Once a week    Attends Religious Services: More than 4 times per year    Active Member of [employer] or Organizations: Yes    Attends Banker Meetings: 1 to 4 times per year    Marital Status: Never married     Family History: The patient's family history includes Asthma in her brother; COPD in her paternal grandfather; Cancer in her paternal aunt; Cancer - Lung in her maternal grandmother; Congestive Heart Failure in her maternal uncle; Crohn's disease in her brother; Dementia in her maternal grandfather; Diabetes in her maternal aunt, maternal grandfather, maternal grandmother, maternal uncle, and mother; Heart murmur in her brother; Hypertension in her father, maternal aunt, maternal grandfather, and mother; Neuropathy in her maternal grandfather, maternal uncle, and mother; Stroke in her maternal grandfather.  ROS:   Please see the history of present illness.     All other systems reviewed and are negative.  EKGs/Labs/Other Studies Reviewed:         Recent Labs: 07/12/2023: ALT 13; BUN 7; Creatinine, Ser 0.79; Hemoglobin 13.2; Platelets 271; Potassium 3.8; Sodium 140; TSH 0.604   Recent Lipid Panel    Component Value Date/Time   CHOL 157 07/12/2023 1032   TRIG 83 07/12/2023 1032   HDL 43 07/12/2023 1032   CHOLHDL 3.7 07/12/2023 1032   LDLCALC 98 07/12/2023 1032    Physical Exam:   VS:  BP (!) 160/122 (BP Location: Right Arm, Patient Position: Sitting, Cuff Size: Large)    Pulse 84   Ht 5' 7 (1.702 m)   Wt 278 lb (126.1 kg)   SpO2 98%   BMI 43.54 kg/m  , BMI Body mass index is 43.54 kg/m. GENERAL:  Well appearing HEENT: Pupils equal round and reactive,  fundi not visualized, oral mucosa unremarkable NECK:  No jugular venous distention, waveform within normal limits, carotid upstroke brisk and symmetric, no bruits, no thyromegaly LYMPHATICS:  No cervical adenopathy LUNGS:  Clear to auscultation bilaterally HEART:  RRR.  PMI not displaced or sustained,S1 and S2 within normal limits, no S3, no S4, no clicks, no rubs, no murmurs ABD:  Flat, positive bowel sounds normal in frequency in pitch, no bruits, no rebound, no guarding, no midline pulsatile mass, no hepatomegaly, no splenomegaly EXT:  2 plus pulses throughout, no edema, no cyanosis no clubbing SKIN:  No rashes no nodules NEURO:  Cranial nerves II through XII grossly intact, motor grossly intact throughout PSYCH:  Cognitively intact, oriented to person place and time   ASSESSMENT/PLAN:    Assessment & Plan Hypertension Hypertension poorly controlled with current regimen. Home readings up to 185/138 mmHg.SABRA Discussed increasing hydralazine  or adding olmesartan with amlodipine . Emphasized home monitoring and treatment adjustment. - Increase hydralazine  dose from 10 mg to 25 mg TID. Can further increase as needed.  - Refill amlodipine  and hydralazine  prescriptions at [facility]. - Consider adding olmesartan if needed though may may defer due to swelling in lips reportedly with Lisinopril . - Monitor blood pressure at home and document readings. - Send MyChart message in two weeks to assess blood pressure control. -Presently on Clonidine  at bedtime per psychiatry. As not prescribed for BP control, defer dosing to their team.  Diabetes with neuropathy Diabetes complicated by neuropathy causing discomfort and sleep disturbances. Managed with gabapentin  and compression socks. - Continue to follow with PCP.    OSA CPAP compliance encouraged.   Anxiety -Follows with psychiatry.  Migraine -Follows with neurology  Bag from [employer] provided. Will place referral to SW in case additional resources needed.   Screening for Secondary Hypertension:     11/25/2021    9:00 AM  Causes  Drugs/Herbals Screened     - Comments limits sodium intake.  Cooks at home.  1-2 coffees daily.  Minimal EtOH.  Renovascular HTN Screened     - Comments Check renal artery Dopplers  Sleep Apnea Screened     - Comments On CPAP  Thyroid  Disease Screened     - Comments Check TSH  Hyperaldosteronism Screened     - Comments Check reninin/aldo  Pheochromocytoma Screened     - Comments check catecholamines and metanephrines  Cushing's Syndrome Screened     - Comments Check cortisol  Hyperparathyroidism Screened     - Comments Check calcium  Coarctation of the Aorta Screened     - Comments BP symmetric  Compliance Screened    Relevant Labs/Studies:    Latest Ref Rng & Units 07/12/2023   10:32 AM 02/06/2023    3:27 PM 02/01/2023    1:21 PM  Basic Labs  Sodium 134 - 144 mmol/L 140  138  137   Potassium 3.5 - 5.2 mmol/L 3.8  3.9  3.4   Creatinine 0.57 - 1.00 mg/dL 9.20  9.23  9.29        Latest Ref Rng & Units 07/12/2023   10:32 AM 02/06/2023    3:27 PM  Thyroid    TSH 0.450 - 4.500 uIU/mL 0.604  0.384        Latest Ref Rng & Units 11/30/2021    8:43 AM  Renin/Aldosterone   Aldosterone 0.0 - 30.0 ng/dL 6.1   Renin 9.832 - 4.619 ng/mL/hr 0.285   Aldos/Renin Ratio 0.0 - 30.0 21.4  Latest Ref Rng & Units 11/30/2021    8:43 AM  Metanephrines/Catecholamines   Epinephrine  0 - 62 pg/mL 22   Norepinephrine 0 - 874 pg/mL 413   Dopamine 0 - 48 pg/mL <30   Metanephrines 0.0 - 88.0 pg/mL 15.7   Normetanephrines  0.0 - 210.1 pg/mL 31.9           =  Disposition:    FU with MD/APP/PharmD in 6-8 weeks    Medication Adjustments/Labs and Tests Ordered: Current medicines are reviewed at length  with the patient today.  Concerns regarding medicines are outlined above.  Orders Placed This Encounter  Procedures   EKG 12-Lead   No orders of the defined types were placed in this encounter.    Signed, Reche GORMAN Finder, NP  05/08/2024 3:10 PM    [facility]

## 2024-05-09 ENCOUNTER — Ambulatory Visit: Admitting: Family Medicine

## 2024-05-09 ENCOUNTER — Telehealth: Payer: Self-pay | Admitting: Licensed Clinical Social Worker

## 2024-05-09 NOTE — Telephone Encounter (Signed)
 H&V Care Navigation CSW Progress Note  Clinical Social Worker contacted patient by phone to complete SDOH Screening and assist with any additional resources pt may be interested in. No answer today at [phone], unable to leave voicemail as it is currently full. Will re-attempt again as able.  Patient is participating in a Managed Medicaid Plan:  Yes- Healthy Blue  SDOH Screenings   Food Insecurity: No Food Insecurity (06/06/2023)  Housing: Low Risk  (06/06/2023)  Transportation Needs: No Transportation Needs (06/06/2023)  Utilities: Not At Risk (06/06/2023)  Alcohol Screen: Low Risk  (06/06/2023)  Depression (PHQ2-9): Low Risk  (08/14/2023)  Recent Concern: Depression (PHQ2-9) - High Risk (07/12/2023)  Financial Resource Strain: Patient Declined (06/06/2023)  Physical Activity: Sufficiently Active (06/06/2023)  Social Connections: Moderately Integrated (06/06/2023)  Stress: Stress Concern Present (06/06/2023)  Tobacco Use: High Risk (05/08/2024)    Marit Lark, MSW, LCSW Clinical Social Worker II [facility] Heart/Vascular Care Navigation  [phone]- work cell phone (preferred)

## 2024-05-13 ENCOUNTER — Telehealth: Payer: Self-pay | Admitting: Licensed Clinical Social Worker

## 2024-05-13 NOTE — Telephone Encounter (Signed)
 H&V Care Navigation CSW Progress Note  Clinical Social Worker contacted patient by phone to complete SDOH Screening and assist with any additional resources pt may be interested in. No answer today at [phone], able to leave today. Remain available should pt return my call.   Patient is participating in a Managed Medicaid Plan:  Yes- healthy blue  SDOH Screenings   Food Insecurity: No Food Insecurity (06/06/2023)  Housing: Low Risk  (06/06/2023)  Transportation Needs: No Transportation Needs (06/06/2023)  Utilities: Not At Risk (06/06/2023)  Alcohol Screen: Low Risk  (06/06/2023)  Depression (PHQ2-9): Low Risk  (08/14/2023)  Recent Concern: Depression (PHQ2-9) - High Risk (07/12/2023)  Financial Resource Strain: Patient Declined (06/06/2023)  Physical Activity: Sufficiently Active (06/06/2023)  Social Connections: Moderately Integrated (06/06/2023)  Stress: Stress Concern Present (06/06/2023)  Tobacco Use: High Risk (05/08/2024)     [name], MSW, LCSW Clinical Social Worker II [facility] Heart/Vascular Care Navigation  [phone]- work cell phone (preferred)

## 2024-05-13 NOTE — Progress Notes (Unsigned)
 NEUROLOGY FOLLOW UP OFFICE NOTE  [name] [id number]  Assessment/Plan:   Migraine with aura, without status migrainosus, intractable Obstructive sleep apnea Hypertension - elevated today.   Just took her BP medication prior to visit.   Migraine prevention:  Emgality  *** Migraine rescue:  Nurtec; promethazine  25mg  for nausea *** Limit use of pain relievers to no more than 9 days out of the month to prevent risk of rebound or medication-overuse headache. Keep headache diary CPAP Follow up 8 months.    Subjective:  [name] is a 38 year old left-handed female with HTN, PTSD, depression, PCOS, DM 2, and history of kidney stones who follows up for migraines.  UPDATE: Started Emgality . *** Intensity:  *** Duration:  *** with Nurtec Frequency:  *** Frequency of abortive medication: *** Current NSAIDS/analgesics:  ibuprofen  800mg , Tylenol  Current triptans:  none Current ergotamine:  none Current anti-emetic:  promethazine  25mg  Current muscle relaxants:  baclofen 10mg  TID PRN Current Antihypertensive medications:  propranolol  10mg  BID PRN, lisinopril -hydrochlorothiazide , amlodipine , hydralazine  Current Antidepressant medications:  mirtazapine  15mg  at bedtime, duloxetine  90mg  daily Current Anticonvulsant medications:  lamotrigine  150mg  daily Current anti-CGRP:  Emgality , Nurtec PRN Other therapy:  none Birth control:  Mirena    Caffeine:  1 cup coffee daily. Diet:  Water.  Sometimes ginger ale.  Skips meals Exercise:  Walks, work out at [employer], swimming Depression:  yes; Anxiety:  yes Sleep hygiene:  improved since starting mirtazapine   HISTORY: Onset:  38 years old (along with diagnosis of hypertension and OSA). Worse by 2019.   Location:  usually temples bilaterally, also behind ears bilaterally Quality:  pounding Intensity:  7-8/10.   Aura:  sees stars/sparkles in vision Prodrome:  absent. Associated symptoms:  Nausea, vomiting, photophobia,  phonophobia, lightheaded.  She denies associated unilateral numbness or weakness. Duration:  4 days (subsides over 2 days with sumatriptan ; subsides 2-3 days with [place]) Frequency:  1 to 2 times a month (once every 2 to 3 months on Aimovig) Triggers:  unknown Relieving factors:  sleep, fresh air Activity:  cannot function  CT head on 12/04/2017 personally reviewed was normal.  Past NSAIDS/analgesics:  diclofenac  75mg , Excedrin Past abortive triptans:  sumatriptan  100mg  (effective), rizatriptan 10mg  Past abortive ergotamine:  none Past muscle relaxants:  tizanidine Past anti-emetic:  ondansetron , promethazine  (effective) Past antihypertensive medications:  metoprolol, carvedilol Past antidepressant medications:  bupropion , escitalopram  Past anticonvulsant medications:  topiramate, lamotrigine , gabapentin  Past anti-CGRP:  Ubrelvy 100mg  (effective), Aimovig 140mg  (effective but uncontrolled HTN) Past vitamins/Herbal/Supplements:  none Past antihistamines/decongestants:  none Other past therapies:  none   Family history of headache:  no  PAST MEDICAL HISTORY: 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    History of kidney stones    Hypertension    IUD (intrauterine device) in place    Migraines    Neuropathy    Obese    Osteoarthritis    left knee   Papanicolaou smear of cervix with positive high risk human papilloma virus (HPV) test 03/22/2021  03/22/21 repeat pap in 1 year per ASCCP guideline, 5 year risk for CIN 3+ is 2.25 %   Polycystic ovarian syndrome     PTSD (post-traumatic stress disorder)    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     AIMOVIG 140 MG/ML SOAJ  (Patient not taking: Reported on 05/08/2024)     amLODipine  (NORVASC ) 10 MG tablet Take 1 tablet (10 mg total) by mouth daily. 180 tablet 1   baclofen (LIORESAL) 10 MG tablet Take 10 mg by mouth 3 (three) times daily as needed.     cloNIDine  HCl (KAPVAY ) 0.1 MG TB12 ER tablet Take 1 tablet (0.1 mg total) by mouth at bedtime. 30 tablet 5   DULoxetine  (CYMBALTA ) 30 MG capsule Take 1 capsule (30 mg total) by mouth daily. Take with 60mg  once daily. 30 capsule 5   DULoxetine  (CYMBALTA ) 60 MG capsule Take 1 capsule (60 mg total) by mouth daily. Take with 30mg  once daily. 30 capsule 5   fluconazole  (DIFLUCAN ) 150 MG tablet TAKE ONE TABLET BY MOUTH NOW THEN CAN REPEAT IN 3 DAYS 2 tablet 11   gabapentin  (NEURONTIN ) 300 MG capsule Take 1 capsule (300 mg total) by mouth 3 (three) times daily. 90 capsule 0   Galcanezumab -gnlm (EMGALITY ) 120 MG/ML SOAJ Inject 120 mg into the skin every 28 (twenty-eight) days. 1.12 mL 11   hydrALAZINE  (APRESOLINE ) 25 MG tablet Take 1 tablet (25 mg total) by mouth 3 (three) times daily. 270 tablet 3   ibuprofen  (ADVIL ) 800 MG tablet Take 1 tablet (800 mg total) by mouth 3 (three) times daily. (Patient taking differently: Take 800 mg by mouth 2 (two) times daily as needed for moderate pain (pain score 4-6).) 80 tablet 1   lamoTRIgine  (LAMICTAL ) 200 MG tablet Take 1 tablet (200 mg total) by mouth daily. 30 tablet 5   levonorgestrel  (MIRENA ) 20 MCG/24HR IUD 1 each by Intrauterine route once.     mirtazapine  (REMERON ) 30 MG tablet Take 1 tablet (30 mg total) by mouth at bedtime.  30 tablet 5   nystatin  cream (MYCOSTATIN ) Apply 1 Application topically 2 (two) times daily. 30 g 11   predniSONE  (STERAPRED UNI-PAK 21 TAB) 10 MG (21) TBPK tablet take 60mg  day 1, then 50mg  day 2, then 40mg  day 3, then 30mg  day 4, then 20mg  day 5, then 10mg  day 6, then STOP 21 tablet 0   promethazine  (PHENERGAN ) 25 MG tablet Take 1 tablet (25 mg total) by mouth every 6 (six) hours as needed for nausea or vomiting. 30 tablet 5   propranolol  (INDERAL ) 10 MG tablet Take 1 tablet (10 mg total) by mouth 3 (three) times daily as needed (Anxiety/panic). 90 tablet 5   Rimegepant Sulfate (NURTEC) 75 MG TBDP Take 1 tablet (75 mg total) by mouth daily as needed. 8 tablet 5   Semaglutide  (RYBELSUS ) 14 MG TABS Take 1 tablet (14 mg total) by mouth daily. 90 tablet 1   SUMAtriptan  (IMITREX ) 100 MG tablet Take 1 tablet (100 mg total) by mouth as needed. 10 tablet 0   triamcinolone  (KENALOG ) 0.025 % ointment Apply 1 Application topically 2 (two) times daily. 30 g 11   valACYclovir  (  VALTREX ) 1000 MG tablet Take 1 tablet (1,000 mg total) by mouth daily. 30 tablet 12   No current facility-administered medications on file prior to visit.    ALLERGIES: No Known Allergies  FAMILY HISTORY: Family History  Problem Relation Age of Onset   Neuropathy Mother    Hypertension Mother    Diabetes Mother    Hypertension Father    Asthma Brother    Heart murmur Brother    Crohn's disease Brother    Diabetes Maternal Aunt    Hypertension Maternal Aunt    Neuropathy Maternal Uncle    Diabetes Maternal Uncle    Congestive Heart Failure Maternal Uncle    Cancer Paternal Aunt        uterine   Diabetes Maternal Grandmother    Cancer - Lung Maternal Grandmother    Neuropathy Maternal Grandfather    Dementia Maternal Grandfather    Diabetes Maternal Grandfather    Hypertension Maternal Grandfather    Stroke Maternal Grandfather    COPD Paternal Grandfather       Objective:  *** General: No acute distress.   Patient appears ***-groomed.   Head:  Normocephalic/atraumatic Eyes:  Fundi examined but not visualized Neck: supple, no paraspinal tenderness, full range of motion Heart:  Regular rate and rhythm Lungs:  Clear to auscultation bilaterally Back: No paraspinal tenderness Neurological Exam: alert and oriented.  Speech fluent and not dysarthric, language intact.  CN II-XII intact. Bulk and tone normal, muscle strength 5/5 throughout.  Sensation to light touch intact.  Deep tendon reflexes 2+ throughout, toes downgoing.  Finger to nose testing intact.  Gait normal, Romberg negative.   Juliene Dunnings, DO  CC: ***

## 2024-05-14 ENCOUNTER — Encounter: Payer: Self-pay | Admitting: Neurology

## 2024-05-14 ENCOUNTER — Ambulatory Visit (INDEPENDENT_AMBULATORY_CARE_PROVIDER_SITE_OTHER): Payer: Medicaid Other | Admitting: Neurology

## 2024-05-14 VITALS — BP 146/89 | HR 85 | Ht 67.0 in | Wt 275.0 lb

## 2024-05-14 DIAGNOSIS — G43019 Migraine without aura, intractable, without status migrainosus: Secondary | ICD-10-CM | POA: Diagnosis not present

## 2024-05-14 NOTE — Patient Instructions (Signed)
 Continue Emgality   Continue Nurtec once daily as needed Start headache diary Contact me in exactly 2 months (via mychart) with update with frequency of migraines and how long they last using Nurtec)

## 2024-05-17 ENCOUNTER — Encounter (HOSPITAL_BASED_OUTPATIENT_CLINIC_OR_DEPARTMENT_OTHER): Payer: Self-pay

## 2024-05-17 DIAGNOSIS — E1142 Type 2 diabetes mellitus with diabetic polyneuropathy: Secondary | ICD-10-CM

## 2024-05-30 ENCOUNTER — Telehealth: Payer: Self-pay | Admitting: Family Medicine

## 2024-05-30 ENCOUNTER — Encounter: Payer: Self-pay | Admitting: Family Medicine

## 2024-05-30 ENCOUNTER — Ambulatory Visit: Admitting: Family Medicine

## 2024-05-30 ENCOUNTER — Other Ambulatory Visit (HOSPITAL_COMMUNITY): Payer: Self-pay

## 2024-05-30 ENCOUNTER — Telehealth: Payer: Self-pay | Admitting: Pharmacy Technician

## 2024-05-30 VITALS — BP 126/87 | HR 82 | Resp 16 | Ht 67.0 in | Wt 276.1 lb

## 2024-05-30 DIAGNOSIS — G8929 Other chronic pain: Secondary | ICD-10-CM

## 2024-05-30 DIAGNOSIS — E038 Other specified hypothyroidism: Secondary | ICD-10-CM | POA: Diagnosis not present

## 2024-05-30 DIAGNOSIS — F332 Major depressive disorder, recurrent severe without psychotic features: Secondary | ICD-10-CM | POA: Diagnosis not present

## 2024-05-30 DIAGNOSIS — E7849 Other hyperlipidemia: Secondary | ICD-10-CM | POA: Diagnosis not present

## 2024-05-30 DIAGNOSIS — E1142 Type 2 diabetes mellitus with diabetic polyneuropathy: Secondary | ICD-10-CM

## 2024-05-30 DIAGNOSIS — R7301 Impaired fasting glucose: Secondary | ICD-10-CM

## 2024-05-30 DIAGNOSIS — M25562 Pain in left knee: Secondary | ICD-10-CM

## 2024-05-30 DIAGNOSIS — M25561 Pain in right knee: Secondary | ICD-10-CM | POA: Diagnosis not present

## 2024-05-30 DIAGNOSIS — E559 Vitamin D deficiency, unspecified: Secondary | ICD-10-CM | POA: Diagnosis not present

## 2024-05-30 MED ORDER — OZEMPIC (0.25 OR 0.5 MG/DOSE) 2 MG/3ML ~~LOC~~ SOPN
0.2500 mg | PEN_INJECTOR | SUBCUTANEOUS | 0 refills | Status: DC
Start: 2024-05-30 — End: 2024-05-30

## 2024-05-30 MED ORDER — RYBELSUS 14 MG PO TABS: 14.0000 mg | ORAL_TABLET | Freq: Every day | ORAL | 1 refills | Status: DC

## 2024-05-30 MED ORDER — FREESTYLE LIBRE 3 SENSOR MISC: 2 refills | Status: DC

## 2024-05-30 NOTE — Patient Instructions (Addendum)
 I appreciate the opportunity to provide care to you today!    Follow up:  1 months  Labs: please stop by the lab today to get your blood drawn (CBC, CMP, TSH, Lipid profile, HgA1c, Vit D)  For a Healthier YOU, I Recommend: Reducing your intake of sugar, sodium, carbohydrates, and saturated fats. Increasing your fiber intake by incorporating more whole grains, fruits, and vegetables into your meals. Setting healthy goals with a focus on lowering your consumption of carbs, sugar, and unhealthy fats. Adding variety to your diet by including a wide range of fruits and vegetables. Cutting back on soda and limiting processed foods as much as possible. Staying active: In addition to taking your weight loss medication, aim for at least 150 minutes of moderate-intensity physical activity each week for optimal results.   .   Please continue to a heart-healthy diet and increase your physical activities. Try to exercise for at least five days a week.    It was a pleasure to see you and I look forward to continuing to work together on your health and well-being. Please do not hesitate to call the office if you need care or have questions about your care.  In case of emergency, please visit the Emergency Department for urgent care, or contact our clinic at [phone] to schedule an appointment. We're here to help you!   Have a wonderful day and week. With Gratitude, Mechele Kittleson MSN, [name]-BC

## 2024-05-30 NOTE — Assessment & Plan Note (Signed)
 Imaging of the left knee confirms osteoarthritis. Patient reports pain and stiffness in the knees, worsened with increased activity. Pain is also described as worse with prolonged sitting and improves with movement. No recent injury or trauma reported. Encouraged to take over-the-counter ibuprofen  as needed, and consider heat therapy as tolerated. Recommended stretching and strengthening exercises to support joint function. Advised to follow up if symptoms worsen or fail to improve.

## 2024-05-30 NOTE — Progress Notes (Signed)
 Established Patient Office Visit  Subjective:  Patient ID: [name], female    DOB: November 07, 1986  Age: 38 y.o. MRN: [id number]  CC:  Chief Complaint  Patient presents with   Diabetes    Wants to discuss her Rybelsus  and issues she has been having. Wants a Continuous glucose monitor   Knee Pain    Bilateral knee pain that started getting bad about 2 weeks ago     HPI [name] is a 38 y.o. female with past medical history of hypertension, type 2 diabetes, vit d def,  presents for f/u of  chronic medical conditions. For the details of today's visit, please refer to the assessment and plan.    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    History of kidney stones    Hypertension    IUD (intrauterine device) in place    Migraines    Neuropathy    Obese    Osteoarthritis    left knee   Papanicolaou smear of cervix with positive high risk human papilloma virus (HPV) test 03/22/2021   03/22/21 repeat pap in 1 year per ASCCP guideline, 5 year risk for CIN 3+ is 2.25 %   Polycystic ovarian syndrome    PTSD (post-traumatic stress disorder)    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   Sleep apnea    Trichomonas contact, treated    Type 2 diabetes mellitus (HCC)    Vitamin D  deficiency    Yeast infection     Past Surgical History:  Procedure Laterality Date   CESAREAN SECTION  09/24/2012   Procedure: CESAREAN SECTION;  Surgeon: Elveria Mungo, MD;  Location: [facility];   Service: Gynecology;  Laterality: N/A;   CHOLECYSTECTOMY     CHOLECYSTECTOMY N/A 04/05/2013   Procedure: LAPAROSCOPIC CHOLECYSTECTOMY;  Surgeon: Oneil DELENA Budge, MD;  Location: [facility];  Service: General;  Laterality: N/A;   TONSILLECTOMY      Family History  Problem Relation Age of Onset   Neuropathy Mother    Hypertension Mother    Diabetes Mother    Heart disease Mother    Hypertension Father    Asthma Brother    Heart murmur Brother    Crohn's disease Brother    Diabetes Maternal Aunt    Hypertension Maternal Aunt    Neuropathy Maternal Uncle    Diabetes Maternal Uncle    Congestive Heart Failure Maternal Uncle    Cancer Paternal Aunt        uterine   Diabetes Maternal Grandmother    Cancer - Lung Maternal Grandmother    Neuropathy Maternal Grandfather    Dementia Maternal Grandfather    Diabetes Maternal Grandfather    Hypertension Maternal Grandfather    Stroke Maternal Grandfather    Heart disease Maternal Grandfather    COPD Paternal Grandfather     Social History   Socioeconomic History   Marital status: Single  Spouse name: Not on file   Number of children: 1   Years of education: Not on file   Highest education level: 12th grade  Occupational History   Not on file  Tobacco Use   Smoking status: Former    Types: E-cigarettes   Smokeless tobacco: Never   Tobacco comments:    Vape cartridge lasts about a month at a time  Vaping Use   Vaping status: Some Days  Substance and Sexual Activity   Alcohol use: Yes    Comment: Infrequent lately 1 unit of alcohol at the time   Drug use: No   Sexual activity: Yes    Birth control/protection: I.U.D.    Comment: female partner  Other Topics Concern   Not on file  Social History Narrative   Left handed   Social Drivers of Health   Financial Resource Strain: Patient Declined (06/06/2023)   Overall Financial Resource Strain (CARDIA)    Difficulty of Paying Living Expenses: Patient declined  Food  Insecurity: No Food Insecurity (06/06/2023)   Hunger Vital Sign    Worried About Running Out of Food in the Last Year: Never true    Ran Out of Food in the Last Year: Never true  Transportation Needs: No Transportation Needs (06/06/2023)   PRAPARE - Administrator, Civil Service (Medical): No    Lack of Transportation (Non-Medical): No  Physical Activity: Sufficiently Active (06/06/2023)   Exercise Vital Sign    Days of Exercise per Week: 7 days    Minutes of Exercise per Session: 30 min  Stress: Stress Concern Present (06/06/2023)   [employer] of Occupational Health - Occupational Stress Questionnaire    Feeling of Stress : To some extent  Social Connections: Moderately Integrated (06/06/2023)   Social Connection and Isolation Panel    Frequency of Communication with Friends and Family: More than three times a week    Frequency of Social Gatherings with Friends and Family: Once a week    Attends Religious Services: More than 4 times per year    Active Member of [employer] or Organizations: Yes    Attends Banker Meetings: 1 to 4 times per year    Marital Status: Never married  Intimate Partner Violence: Not At Risk (06/06/2023)   Humiliation, Afraid, Rape, and Kick questionnaire    Fear of Current or Ex-Partner: No    Emotionally Abused: No    Physically Abused: No    Sexually Abused: No    Outpatient Medications Prior to Visit  Medication Sig Dispense Refill   acetaminophen  (TYLENOL ) 500 MG tablet Take 1,000 mg by mouth as needed.     amLODipine  (NORVASC ) 10 MG tablet Take 1 tablet (10 mg total) by mouth daily. 180 tablet 1   baclofen (LIORESAL) 10 MG tablet Take 10 mg by mouth 3 (three) times daily as needed.     cloNIDine  HCl (KAPVAY ) 0.1 MG TB12 ER tablet Take 1 tablet (0.1 mg total) by mouth at bedtime. 30 tablet 5   DULoxetine  (CYMBALTA ) 30 MG capsule Take 1 capsule (30 mg total) by mouth daily. Take with 60mg  once daily. 30 capsule 5   DULoxetine   (CYMBALTA ) 60 MG capsule Take 1 capsule (60 mg total) by mouth daily. Take with 30mg  once daily. 30 capsule 5   fluconazole  (DIFLUCAN ) 150 MG tablet TAKE ONE TABLET BY MOUTH NOW THEN CAN REPEAT IN 3 DAYS 2 tablet 11   gabapentin  (NEURONTIN ) 300 MG capsule Take 1 capsule (300 mg  total) by mouth 3 (three) times daily. (Patient taking differently: Take 300 mg by mouth as needed.) 90 capsule 0   Galcanezumab -gnlm (EMGALITY ) 120 MG/ML SOAJ Inject 120 mg into the skin every 28 (twenty-eight) days. 1.12 mL 11   hydrALAZINE  (APRESOLINE ) 25 MG tablet Take 1 tablet (25 mg total) by mouth 3 (three) times daily. 270 tablet 3   ibuprofen  (ADVIL ) 800 MG tablet Take 1 tablet (800 mg total) by mouth 3 (three) times daily. 80 tablet 1   lamoTRIgine  (LAMICTAL ) 200 MG tablet Take 1 tablet (200 mg total) by mouth daily. 30 tablet 5   levonorgestrel  (MIRENA ) 20 MCG/24HR IUD 1 each by Intrauterine route once.     mirtazapine  (REMERON ) 30 MG tablet Take 1 tablet (30 mg total) by mouth at bedtime. 30 tablet 5   nystatin  cream (MYCOSTATIN ) Apply 1 Application topically 2 (two) times daily. 30 g 11   promethazine  (PHENERGAN ) 25 MG tablet Take 1 tablet (25 mg total) by mouth every 6 (six) hours as needed for nausea or vomiting. 30 tablet 5   propranolol  (INDERAL ) 10 MG tablet Take 1 tablet (10 mg total) by mouth 3 (three) times daily as needed (Anxiety/panic). 90 tablet 5   Rimegepant Sulfate (NURTEC) 75 MG TBDP Take 1 tablet (75 mg total) by mouth daily as needed. 8 tablet 5   triamcinolone  (KENALOG ) 0.025 % ointment Apply 1 Application topically 2 (two) times daily. 30 g 11   valACYclovir  (VALTREX ) 1000 MG tablet Take 1 tablet (1,000 mg total) by mouth daily. 30 tablet 12   Semaglutide  (RYBELSUS ) 14 MG TABS Take 1 tablet (14 mg total) by mouth daily. 90 tablet 1   No facility-administered medications prior to visit.    No Known Allergies  ROS Review of Systems  Constitutional:  Negative for chills and fever.  Eyes:   Negative for visual disturbance.  Respiratory:  Negative for chest tightness and shortness of breath.   Musculoskeletal:        Bil knee pain  Neurological:  Negative for dizziness and headaches.      Objective:    Physical Exam HENT:     Head: Normocephalic.     Mouth/Throat:     Mouth: Mucous membranes are moist.  Cardiovascular:     Rate and Rhythm: Normal rate.     Heart sounds: Normal heart sounds.  Pulmonary:     Effort: Pulmonary effort is normal.     Breath sounds: Normal breath sounds.  Musculoskeletal:     Right knee: No swelling, erythema, ecchymosis or lacerations. Normal range of motion.     Left knee: No swelling, erythema, ecchymosis or lacerations. Normal range of motion.  Neurological:     Mental Status: She is alert.     BP 126/87   Pulse 82   Resp 16   Ht 5' 7 (1.702 m)   Wt 276 lb 1.9 oz (125.2 kg)   SpO2 96%   BMI 43.25 kg/m  Wt Readings from Last 3 Encounters:  05/30/24 276 lb 1.9 oz (125.2 kg)  05/14/24 275 lb (124.7 kg)  05/08/24 278 lb (126.1 kg)    Lab Results  Component Value Date   TSH 0.604 07/12/2023   Lab Results  Component Value Date   WBC 8.2 07/12/2023   HGB 13.2 07/12/2023   HCT 40.0 07/12/2023   MCV 93 07/12/2023   PLT 271 07/12/2023   Lab Results  Component Value Date   NA 140 07/12/2023   K 3.8 07/12/2023  CO2 22 07/12/2023   GLUCOSE 92 07/12/2023   BUN 7 07/12/2023   CREATININE 0.79 07/12/2023   BILITOT 0.4 07/12/2023   ALKPHOS 125 (H) 07/12/2023   AST 16 07/12/2023   ALT 13 07/12/2023   PROT 6.8 07/12/2023   ALBUMIN 4.2 07/12/2023   CALCIUM 9.1 07/12/2023   ANIONGAP 10 02/01/2023   EGFR 99 07/12/2023   Lab Results  Component Value Date   CHOL 157 07/12/2023   Lab Results  Component Value Date   HDL 43 07/12/2023   Lab Results  Component Value Date   LDLCALC 98 07/12/2023   Lab Results  Component Value Date   TRIG 83 07/12/2023   Lab Results  Component Value Date   CHOLHDL 3.7  07/12/2023   Lab Results  Component Value Date   HGBA1C 5.8 (H) 07/12/2023      Assessment & Plan:  Type 2 diabetes mellitus with diabetic polyneuropathy, without long-term current use of insulin (HCC) Assessment & Plan: Patient reports that her appetite is not completely suppressed while taking Rybelsus . Her blood sugar is generally well-controlled, though she has occasionally noted episodes of hypoglycemia, with readings in the 30s and 50s. She reports eating three meals daily and plans to follow up with a nutritionist for further guidance.   Orders: -     FreeStyle Libre 3 Sensor; Place 1 sensor on the skin every 14 days. Use to check glucose continuously  Dispense: 2 each; Refill: 2 -     Rybelsus ; Take 1 tablet (14 mg total) by mouth daily.  Dispense: 90 tablet; Refill: 1  Chronic pain of both knees Assessment & Plan: Imaging of the left knee confirms osteoarthritis. Patient reports pain and stiffness in the knees, worsened with increased activity. Pain is also described as worse with prolonged sitting and improves with movement. No recent injury or trauma reported. Encouraged to take over-the-counter ibuprofen  as needed, and consider heat therapy as tolerated. Recommended stretching and strengthening exercises to support joint function. Advised to follow up if symptoms worsen or fail to improve.     Severe episode of recurrent major depressive disorder, without psychotic features (HCC) -     Ambulatory referral to Psychiatry  IFG (impaired fasting glucose) -     Hemoglobin A1c  Vitamin D  deficiency -     VITAMIN D  25 Hydroxy (Vit-D Deficiency, Fractures)  TSH (thyroid -stimulating hormone deficiency) -     TSH + free T4  Other hyperlipidemia -     Lipid panel -     CMP14+EGFR -     CBC with Differential/Platelet  Note: This chart has been completed using Engineer, civil (consulting) software, and while attempts have been made to ensure accuracy, certain words and  phrases may not be transcribed as intended.    Follow-up: Return in about 1 month (around 06/30/2024).   Naveena Eyman, [name]

## 2024-05-30 NOTE — Telephone Encounter (Signed)
 Pharmacy Patient Advocate Encounter   Received notification from CoverMyMeds that prior authorization for FreeStyle Libre 3 Sensor is required/requested.   Insurance verification completed.   The patient is insured through [facility] .   [place] medicaid requires patient to have a diagnosis of insulin-dependent diabetes. Patient chart does not reflect the she is currently on insulin therapy. PA will not be submitted at this time as it will de denied.

## 2024-05-30 NOTE — Assessment & Plan Note (Addendum)
 Patient reports that her appetite is not completely suppressed while taking Rybelsus . Her blood sugar is generally well-controlled, though she has occasionally noted episodes of hypoglycemia, with readings in the 30s and 50s. She reports eating three meals daily and plans to follow up with a nutritionist for further guidance.

## 2024-05-30 NOTE — Telephone Encounter (Signed)
 Please advise, when scheduling [name]'s MRN  [id number] next visit, Dr. Zarwolo wanted to see her in 1 month, however she does not have anything available until 08/22/2024. I scheduled her for a my chart visit that day and added her to the wait list. Patient did not want to see any other providers.

## 2024-05-31 LAB — CBC WITH DIFFERENTIAL/PLATELET
Basophils Absolute: 0 x10E3/uL (ref 0.0–0.2)
Basos: 0 %
EOS (ABSOLUTE): 0.2 x10E3/uL (ref 0.0–0.4)
Eos: 2 %
Hematocrit: 40.3 % (ref 34.0–46.6)
Hemoglobin: 13 g/dL (ref 11.1–15.9)
Immature Grans (Abs): 0 x10E3/uL (ref 0.0–0.1)
Immature Granulocytes: 0 %
Lymphocytes Absolute: 3.9 x10E3/uL — ABNORMAL HIGH (ref 0.7–3.1)
Lymphs: 55 %
MCH: 31 pg (ref 26.6–33.0)
MCHC: 32.3 g/dL (ref 31.5–35.7)
MCV: 96 fL (ref 79–97)
Monocytes Absolute: 0.6 x10E3/uL (ref 0.1–0.9)
Monocytes: 8 %
Neutrophils Absolute: 2.5 x10E3/uL (ref 1.4–7.0)
Neutrophils: 35 %
Platelets: 237 x10E3/uL (ref 150–450)
RBC: 4.2 x10E6/uL (ref 3.77–5.28)
RDW: 12.6 % (ref 11.7–15.4)
WBC: 7.2 x10E3/uL (ref 3.4–10.8)

## 2024-05-31 LAB — VITAMIN D 25 HYDROXY (VIT D DEFICIENCY, FRACTURES): Vit D, 25-Hydroxy: 19.3 ng/mL — ABNORMAL LOW (ref 30.0–100.0)

## 2024-05-31 LAB — CMP14+EGFR
ALT: 20 IU/L (ref 0–32)
AST: 15 IU/L (ref 0–40)
Albumin: 4 g/dL (ref 3.9–4.9)
Alkaline Phosphatase: 114 IU/L (ref 44–121)
BUN/Creatinine Ratio: 8 — ABNORMAL LOW (ref 9–23)
BUN: 7 mg/dL (ref 6–20)
Bilirubin Total: <0.2 mg/dL (ref 0.0–1.2)
CO2: 21 mmol/L (ref 20–29)
Calcium: 9 mg/dL (ref 8.7–10.2)
Chloride: 106 mmol/L (ref 96–106)
Creatinine, Ser: 0.86 mg/dL (ref 0.57–1.00)
Globulin, Total: 2.4 g/dL (ref 1.5–4.5)
Glucose: 99 mg/dL (ref 70–99)
Potassium: 3.9 mmol/L (ref 3.5–5.2)
Sodium: 141 mmol/L (ref 134–144)
Total Protein: 6.4 g/dL (ref 6.0–8.5)
eGFR: 89 mL/min/1.73 (ref >59)

## 2024-05-31 LAB — LIPID PANEL
Chol/HDL Ratio: 4.1 ratio (ref 0.0–4.4)
Cholesterol, Total: 151 mg/dL (ref 100–199)
HDL: 37 mg/dL — ABNORMAL LOW (ref >39)
LDL Chol Calc (NIH): 94 mg/dL (ref 0–99)
Triglycerides: 111 mg/dL (ref 0–149)
VLDL Cholesterol Cal: 20 mg/dL (ref 5–40)

## 2024-05-31 LAB — TSH+FREE T4
Free T4: 1.02 ng/dL (ref 0.82–1.77)
TSH: 1.66 u[IU]/mL (ref 0.450–4.500)

## 2024-05-31 LAB — HEMOGLOBIN A1C
Est. average glucose Bld gHb Est-mCnc: 123 mg/dL
Hgb A1c MFr Bld: 5.9 % — ABNORMAL HIGH (ref 4.8–5.6)

## 2024-05-31 MED ORDER — HYDRALAZINE HCL 50 MG PO TABS: 50.0000 mg | ORAL_TABLET | Freq: Three times a day (TID) | ORAL | 3 refills | Status: DC

## 2024-05-31 NOTE — Addendum Note (Signed)
 Addended by: Timberlynn Kizziah S on: 05/31/2024 02:14 PM   Modules accepted: Orders

## 2024-06-01 ENCOUNTER — Ambulatory Visit: Payer: Self-pay | Admitting: Family Medicine

## 2024-06-01 DIAGNOSIS — E559 Vitamin D deficiency, unspecified: Secondary | ICD-10-CM

## 2024-06-01 MED ORDER — VITAMIN D (ERGOCALCIFEROL) 1.25 MG (50000 UNIT) PO CAPS: 50000.0000 [IU] | ORAL_CAPSULE | ORAL | 1 refills | Status: AC

## 2024-06-17 ENCOUNTER — Telehealth: Payer: Self-pay | Admitting: Pharmacy Technician

## 2024-06-17 ENCOUNTER — Other Ambulatory Visit (HOSPITAL_COMMUNITY): Payer: Self-pay

## 2024-06-17 NOTE — Telephone Encounter (Signed)
 Pharmacy Patient Advocate Encounter   Received notification from CoverMyMeds that prior authorization for EMGALITY  120MG  is required/requested.   Insurance verification completed.   The patient is insured through [employer] .   Per test claim: PA required; PA submitted to above mentioned insurance via CoverMyMeds Key/confirmation #/EOC [id number] Status is pending

## 2024-06-17 NOTE — Telephone Encounter (Signed)
 Pharmacy Patient Advocate Encounter  Received notification from [facility] that Prior Authorization for EMGALITY  120MG  has been APPROVED from 8.11.25 to 8.11.26. Unable to obtain price due to refill too soon rejection, last fill date 8.9.25 next available fill date8.30.25   PA #/Case ID/Reference #: [id number]

## 2024-06-18 ENCOUNTER — Other Ambulatory Visit (HOSPITAL_COMMUNITY): Payer: Self-pay

## 2024-06-24 ENCOUNTER — Telehealth (INDEPENDENT_AMBULATORY_CARE_PROVIDER_SITE_OTHER): Payer: Self-pay | Admitting: Family Medicine

## 2024-06-24 DIAGNOSIS — E1142 Type 2 diabetes mellitus with diabetic polyneuropathy: Secondary | ICD-10-CM

## 2024-06-24 MED ORDER — LANCET DEVICE MISC: 1.0000 | Freq: Three times a day (TID) | 0 refills | Status: AC

## 2024-06-24 MED ORDER — BLOOD GLUCOSE MONITORING SUPPL DEVI
1.0000 | Freq: Three times a day (TID) | 0 refills | Status: AC
Start: 2024-06-24 — End: ?

## 2024-06-24 MED ORDER — LANCETS MISC. MISC: 1.0000 | Freq: Three times a day (TID) | 0 refills | Status: AC

## 2024-06-24 MED ORDER — BLOOD GLUCOSE TEST VI STRP: 1.0000 | ORAL_STRIP | Freq: Three times a day (TID) | 0 refills | Status: AC

## 2024-06-24 NOTE — Progress Notes (Unsigned)
 Virtual Visit via Video Note  I connected with [name] on 06/25/24 at  8:00 AM EDT by a video enabled telemedicine application and verified that I am speaking with the correct person using two identifiers.  Patient Location: Home Provider Location: Office/Clinic  I discussed the limitations, risks, security, and privacy concerns of performing an evaluation and management service by video and the availability of in person appointments. I also discussed with the patient that there may be a patient responsible charge related to this service. The patient expressed understanding and agreed to proceed.  Subjective: PCP: Navil Kole, [name]  Chief Complaint  Patient presents with   Diabetes    Follow up    HPI Diabetic new patient visit on 07/04/2024  The patient reports that her hydralazine  was recently increased to 50 mg daily, noting that her blood pressure has been stable since. She reports doing well with no further complaints.  ROS: Per HPI  Current Outpatient Medications:    Blood Glucose Monitoring Suppl DEVI, 1 each by Does not apply route in the morning, at noon, and at bedtime. May substitute to any manufacturer covered by patient's insurance., Disp: 1 each, Rfl: 0   Glucose Blood (BLOOD GLUCOSE TEST STRIPS) STRP, 1 each by In Vitro route in the morning, at noon, and at bedtime. May substitute to any manufacturer covered by patient's insurance., Disp: 100 strip, Rfl: 0   Lancet Device MISC, 1 each by Does not apply route in the morning, at noon, and at bedtime. May substitute to any manufacturer covered by patient's insurance., Disp: 1 each, Rfl: 0   Lancets Misc. MISC, 1 each by Does not apply route in the morning, at noon, and at bedtime. May substitute to any manufacturer covered by patient's insurance., Disp: 100 each, Rfl: 0   acetaminophen  (TYLENOL ) 500 MG tablet, Take 1,000 mg by mouth as needed., Disp: , Rfl:    amLODipine  (NORVASC ) 10 MG tablet, Take 1 tablet  (10 mg total) by mouth daily., Disp: 180 tablet, Rfl: 1   baclofen (LIORESAL) 10 MG tablet, Take 10 mg by mouth 3 (three) times daily as needed., Disp: , Rfl:    cloNIDine  HCl (KAPVAY ) 0.1 MG TB12 ER tablet, Take 1 tablet (0.1 mg total) by mouth at bedtime., Disp: 30 tablet, Rfl: 5   Continuous Glucose Sensor (FREESTYLE LIBRE 3 SENSOR) MISC, Place 1 sensor on the skin every 14 days. Use to check glucose continuously, Disp: 2 each, Rfl: 2   DULoxetine  (CYMBALTA ) 30 MG capsule, Take 1 capsule (30 mg total) by mouth daily. Take with 60mg  once daily., Disp: 30 capsule, Rfl: 5   DULoxetine  (CYMBALTA ) 60 MG capsule, Take 1 capsule (60 mg total) by mouth daily. Take with 30mg  once daily., Disp: 30 capsule, Rfl: 5   fluconazole  (DIFLUCAN ) 150 MG tablet, TAKE ONE TABLET BY MOUTH NOW THEN CAN REPEAT IN 3 DAYS, Disp: 2 tablet, Rfl: 11   gabapentin  (NEURONTIN ) 300 MG capsule, Take 1 capsule (300 mg total) by mouth 3 (three) times daily. (Patient taking differently: Take 300 mg by mouth as needed.), Disp: 90 capsule, Rfl: 0   Galcanezumab -gnlm (EMGALITY ) 120 MG/ML SOAJ, Inject 120 mg into the skin every 28 (twenty-eight) days., Disp: 1.12 mL, Rfl: 11   hydrALAZINE  (APRESOLINE ) 50 MG tablet, Take 1 tablet (50 mg total) by mouth 3 (three) times daily., Disp: 270 tablet, Rfl: 3   ibuprofen  (ADVIL ) 800 MG tablet, Take 1 tablet (800 mg total) by mouth 3 (three) times  daily., Disp: 80 tablet, Rfl: 1   lamoTRIgine  (LAMICTAL ) 200 MG tablet, Take 1 tablet (200 mg total) by mouth daily., Disp: 30 tablet, Rfl: 5   levonorgestrel  (MIRENA ) 20 MCG/24HR IUD, 1 each by Intrauterine route once., Disp: , Rfl:    mirtazapine  (REMERON ) 30 MG tablet, Take 1 tablet (30 mg total) by mouth at bedtime., Disp: 30 tablet, Rfl: 5   nystatin  cream (MYCOSTATIN ), Apply 1 Application topically 2 (two) times daily., Disp: 30 g, Rfl: 11   promethazine  (PHENERGAN ) 25 MG tablet, Take 1 tablet (25 mg total) by mouth every 6 (six) hours as needed for  nausea or vomiting., Disp: 30 tablet, Rfl: 5   propranolol  (INDERAL ) 10 MG tablet, Take 1 tablet (10 mg total) by mouth 3 (three) times daily as needed (Anxiety/panic)., Disp: 90 tablet, Rfl: 5   Rimegepant Sulfate (NURTEC) 75 MG TBDP, Take 1 tablet (75 mg total) by mouth daily as needed., Disp: 8 tablet, Rfl: 5   Semaglutide  (RYBELSUS ) 14 MG TABS, Take 1 tablet (14 mg total) by mouth daily., Disp: 90 tablet, Rfl: 1   triamcinolone  (KENALOG ) 0.025 % ointment, Apply 1 Application topically 2 (two) times daily., Disp: 30 g, Rfl: 11   valACYclovir  (VALTREX ) 1000 MG tablet, Take 1 tablet (1,000 mg total) by mouth daily., Disp: 30 tablet, Rfl: 12   Vitamin D , Ergocalciferol , (DRISDOL ) 1.25 MG (50000 UNIT) CAPS capsule, Take 1 capsule (50,000 Units total) by mouth every 7 (seven) days., Disp: 27 capsule, Rfl: 1  Observations/Objective: There were no vitals filed for this visit. Physical Exam Patient is well-developed, well-nourished in no acute distress.  Resting comfortably at home.  Head is normocephalic, atraumatic.  No labored breathing.  Speech is clear and coherent with logical content.  Patient is alert and oriented at baseline.   Assessment and Plan: Type 2 diabetes mellitus with diabetic polyneuropathy, without long-term current use of insulin (HCC) -     Blood Glucose Monitoring Suppl; 1 each by Does not apply route in the morning, at noon, and at bedtime. May substitute to any manufacturer covered by patient's insurance.  Dispense: 1 each; Refill: 0 -     Blood Glucose Test; 1 each by In Vitro route in the morning, at noon, and at bedtime. May substitute to any manufacturer covered by patient's insurance.  Dispense: 100 strip; Refill: 0 -     Lancet Device; 1 each by Does not apply route in the morning, at noon, and at bedtime. May substitute to any manufacturer covered by patient's insurance.  Dispense: 1 each; Refill: 0 -     Lancets Misc.; 1 each by Does not apply route in the morning,  at noon, and at bedtime. May substitute to any manufacturer covered by patient's insurance.  Dispense: 100 each; Refill: 0    Follow Up Instructions: Return in about 4 months (around 10/24/2024).   I discussed the assessment and treatment plan with the patient. The patient was provided an opportunity to ask questions, and all were answered. The patient agreed with the plan and demonstrated an understanding of the instructions.   The patient was advised to call back or seek an in-person evaluation if the symptoms worsen or if the condition fails to improve as anticipated.  The above assessment and management plan was discussed with the patient. The patient verbalized understanding of and has agreed to the management plan.   Daliya Parchment, [name]

## 2024-06-25 ENCOUNTER — Other Ambulatory Visit: Payer: Self-pay | Admitting: Family Medicine

## 2024-06-25 DIAGNOSIS — E1142 Type 2 diabetes mellitus with diabetic polyneuropathy: Secondary | ICD-10-CM

## 2024-06-25 MED ORDER — BLOOD GLUCOSE MONITORING SUPPL DEVI: 1.0000 | Freq: Three times a day (TID) | 0 refills | Status: DC

## 2024-06-25 MED ORDER — LANCETS MISC. MISC: 1.0000 | Freq: Three times a day (TID) | 0 refills | Status: DC

## 2024-06-25 MED ORDER — BLOOD GLUCOSE TEST VI STRP
1.0000 | ORAL_STRIP | Freq: Three times a day (TID) | 0 refills | Status: DC
Start: 2024-06-25 — End: 2024-07-25

## 2024-06-25 MED ORDER — LANCET DEVICE MISC: 1.0000 | Freq: Three times a day (TID) | 0 refills | Status: AC

## 2024-07-04 ENCOUNTER — Ambulatory Visit: Admitting: Nutrition

## 2024-07-05 ENCOUNTER — Telehealth: Payer: Self-pay

## 2024-07-05 NOTE — Telephone Encounter (Signed)
 Copied from CRM [id number]. Topic: Referral - Status >> Jul 05, 2024 12:53 PM Delon DASEN wrote: Reason for CRM: Need status on behavioral health referral - [phone]

## 2024-07-10 ENCOUNTER — Encounter: Attending: Family | Admitting: Nutrition

## 2024-07-10 VITALS — Ht 67.0 in | Wt 278.0 lb

## 2024-07-10 DIAGNOSIS — H5213 Myopia, bilateral: Secondary | ICD-10-CM | POA: Diagnosis not present

## 2024-07-10 DIAGNOSIS — Z713 Dietary counseling and surveillance: Secondary | ICD-10-CM | POA: Diagnosis not present

## 2024-07-10 DIAGNOSIS — E1142 Type 2 diabetes mellitus with diabetic polyneuropathy: Secondary | ICD-10-CM | POA: Diagnosis not present

## 2024-07-10 NOTE — Progress Notes (Unsigned)
 Medical Nutrition Therapy  Appointment Start time:  0800  Appointment End time:  0900  Primary concerns today: DM and Obesity  Referral diagnosis: E11.9, E66.9 Preferred learning style: No preference  Learning readiness: Ready   NUTRITION ASSESSMENT  38 yr old bfamale referred for obesity, DM type 2. I work out a lot and have been eating healthier. I can't lose weight. It's frustrating.I don't get hungry and don't have an appetite at times. A1C 5.9%. Is currently on Rybelsus . Wt 267-278 lbs.  Works out 5 days per week 1 hour or more. Works schedule is 10 am to 7 pm. 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 A1C has been as high at 6.7%. Now down to  5.9% Current diet is too restrictive and not meeting her nutritonal needs . This preventing her from losing weight and weight cycling.  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    History of kidney stones    Hypertension    IUD (intrauterine device) in place    Migraines    Neuropathy    Obese    Osteoarthritis    left knee   Papanicolaou smear of cervix with positive high risk human papilloma virus (HPV) test 03/22/2021   03/22/21 repeat pap in 1 year per ASCCP guideline, 5 year risk for CIN 3+ is 2.25 %   Polycystic ovarian syndrome    PTSD (post-traumatic stress disorder)    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     amLODipine  (NORVASC ) 10 MG tablet Take 1 tablet (10 mg total) by mouth daily. 180 tablet 1   baclofen (LIORESAL) 10 MG tablet Take 10 mg by mouth 3 (three) times daily as needed.     Blood Glucose Monitoring Suppl DEVI 1 each by Does not apply route in the morning, at noon, and at bedtime. May substitute to any manufacturer covered by patient's insurance. 1 each 0   Blood Glucose Monitoring Suppl DEVI 1 each by Does not apply route in the morning, at noon, and at bedtime. May substitute to any manufacturer covered by patient's insurance. 1 each 0   cloNIDine  HCl (KAPVAY ) 0.1 MG TB12 ER tablet Take 1 tablet (0.1 mg total) by mouth at bedtime. 30 tablet 5   Continuous Glucose Sensor (FREESTYLE LIBRE 3 SENSOR) MISC Place 1 sensor on the skin every 14 days. Use to check glucose continuously 2 each 2   DULoxetine  (CYMBALTA ) 30 MG capsule Take 1 capsule (30 mg total) by mouth daily. Take with 60mg  once daily. 30 capsule  5   DULoxetine  (CYMBALTA ) 60 MG capsule Take 1 capsule (60 mg total) by mouth daily. Take with 30mg  once daily. 30 capsule 5   fluconazole  (DIFLUCAN ) 150 MG tablet TAKE ONE TABLET BY MOUTH NOW THEN CAN REPEAT IN 3 DAYS 2 tablet 11   gabapentin  (NEURONTIN ) 300 MG capsule Take 1 capsule (300 mg total) by mouth 3 (three) times daily. (Patient taking differently: Take 300 mg by mouth as needed.) 90 capsule 0   Galcanezumab -gnlm (EMGALITY ) 120 MG/ML SOAJ Inject 120 mg into the skin every 28 (twenty-eight) days. 1.12 mL 11   Glucose Blood (BLOOD GLUCOSE TEST STRIPS) STRP 1 each by In Vitro route in the morning, at noon, and at bedtime. May substitute to any manufacturer covered by patient's insurance. 100 strip 0   Glucose Blood (BLOOD GLUCOSE TEST STRIPS) STRP 1 each by In Vitro route in the  morning, at noon, and at bedtime. May substitute to any manufacturer covered by patient's insurance. 100 strip 0   hydrALAZINE  (APRESOLINE ) 50 MG tablet Take 1 tablet (50 mg total) by mouth 3 (three) times daily. 270 tablet 3   ibuprofen  (ADVIL ) 800 MG tablet Take 1 tablet (800 mg total) by mouth 3 (three) times daily. 80 tablet 1   lamoTRIgine  (LAMICTAL ) 200 MG tablet Take 1 tablet (200 mg total) by mouth daily. 30 tablet 5   Lancet Device MISC 1 each by Does not apply route in the morning, at noon, and at bedtime. May substitute to any manufacturer covered by patient's insurance. 1 each 0   Lancet Device MISC 1 each by Does not apply route in the morning, at noon, and at bedtime. May substitute to any manufacturer covered by patient's insurance. 1 each 0   Lancets Misc. MISC 1 each by Does not apply route in the morning, at noon, and at bedtime. May substitute to any manufacturer covered by patient's insurance. 100 each 0   Lancets Misc. MISC 1 each by Does not apply route in the morning, at noon, and at bedtime. May substitute to any manufacturer covered by patient's insurance. 100 each 0   levonorgestrel  (MIRENA ) 20 MCG/24HR IUD 1 each by Intrauterine route once.     mirtazapine  (REMERON ) 30 MG tablet Take 1 tablet (30 mg total) by mouth at bedtime. 30 tablet 5   nystatin  cream (MYCOSTATIN ) Apply 1 Application topically 2 (two) times daily. 30 g 11   promethazine  (PHENERGAN ) 25 MG tablet Take 1 tablet (25 mg total) by mouth every 6 (six) hours as needed for nausea or vomiting. 30 tablet 5   propranolol  (INDERAL ) 10 MG tablet Take 1 tablet (10 mg total) by mouth 3 (three) times daily as needed (Anxiety/panic). 90 tablet 5   Rimegepant Sulfate (NURTEC) 75 MG TBDP Take 1 tablet (75 mg total) by mouth daily as needed. 8 tablet 5   Semaglutide  (RYBELSUS ) 14 MG TABS Take 1 tablet (14 mg total) by mouth daily. 90 tablet 1   triamcinolone  (KENALOG ) 0.025 % ointment Apply 1 Application topically 2 (two)  times daily. 30 g 11   valACYclovir  (VALTREX ) 1000 MG tablet Take 1 tablet (1,000 mg total) by mouth daily. 30 tablet 12   Vitamin D , Ergocalciferol , (DRISDOL ) 1.25 MG (50000 UNIT) CAPS capsule Take 1 capsule (50,000 Units total) by mouth every 7 (seven) days. 27 capsule 1   No current facility-administered medications on file prior to visit.    Labs:  Lab Results  Component Value Date   HGBA1C 5.9 (H) 05/30/2024  Latest Ref Rng & Units 05/30/2024    9:54 AM 07/12/2023   10:32 AM 02/06/2023    3:27 PM  CMP  Glucose 70 - 99 mg/dL 99  92  97   BUN 6 - 20 mg/dL 7  7  10    Creatinine 0.57 - 1.00 mg/dL 9.13  9.20  9.23   Sodium 134 - 144 mmol/L 141  140  138   Potassium 3.5 - 5.2 mmol/L 3.9  3.8  3.9   Chloride 96 - 106 mmol/L 106  106  103   CO2 20 - 29 mmol/L 21  22  21    Calcium 8.7 - 10.2 mg/dL 9.0  9.1  9.4   Total Protein 6.0 - 8.5 g/dL 6.4  6.8  7.1   Total Bilirubin 0.0 - 1.2 mg/dL <9.7  0.4  0.3   Alkaline Phos 44 - 121 IU/L 114  125  122   AST 0 - 40 IU/L 15  16  16    ALT 0 - 32 IU/L 20  13  16     Lipid Panel     Component Value Date/Time   CHOL 151 05/30/2024 0954   TRIG 111 05/30/2024 0954   HDL 37 (L) 05/30/2024 0954   CHOLHDL 4.1 05/30/2024 0954   LDLCALC 94 05/30/2024 0954   LABVLDL 20 05/30/2024 0954    Notable Signs/Symptoms: ***  Lifestyle & Dietary Hx LIves with her son Eats most meals at home.  Estimated daily fluid intake: 120 oz Supplements: Vit D, B12 and  Sleep: 8 hrs Stress / self-care: Depression Current average weekly physical activity:  60 minutes most days per week  24-Hr Dietary Recall First Meal: skips due to no appetite til 12 with Rybesus Snack:  Second Meal: Grapes, banana and 2 boiled eggs,  water Snack:  Third Meal: strawberries and cheese and crackers 4 pm; 4 baked chicken wings, corn and cabbage Dinner: skipped-- water  Beverages: water  Estimated Energy Needs Calories: *** Carbohydrate: ***g Protein: ***g Fat:  ***g   NUTRITION DIAGNOSIS  {CHL AMB NUTRITIONAL [phone]}   NUTRITION INTERVENTION  Nutrition education (E-1) on the following topics:  ***  Handouts Provided Include  ***  Learning Style & Readiness for Change Teaching method utilized: Visual & Auditory  Demonstrated degree of understanding via: Teach Back  Barriers to learning/adherence to lifestyle change: ***  Goals Established by Pt ***   MONITORING & EVALUATION Dietary intake, weekly physical activity, and *** in ***.  Next Steps  Patient is to ***.

## 2024-07-10 NOTE — Patient Instructions (Signed)
 Goals  Eat three meals per day at times discussed. B) 8 am Lunch 12-2 D)5-7 Eat protein at each meals 2 vegetables with lunch and dinner 1 piece of fruit with each meal Don;t skip meals Don't restrict food groups  B idea:  1 boiled egg, yogurt, whole wheat toast L) idea   Spinach salad  with chicken breast with apple slices, water and carrots D) idea   Baked sweet potato, salmon and leftover spinach salad and fruit, water  Keep working out.

## 2024-07-11 ENCOUNTER — Encounter: Payer: Self-pay | Admitting: Nutrition

## 2024-07-18 ENCOUNTER — Other Ambulatory Visit (HOSPITAL_COMMUNITY): Payer: Self-pay

## 2024-07-25 ENCOUNTER — Telehealth (INDEPENDENT_AMBULATORY_CARE_PROVIDER_SITE_OTHER): Admitting: Family

## 2024-07-25 ENCOUNTER — Encounter (HOSPITAL_BASED_OUTPATIENT_CLINIC_OR_DEPARTMENT_OTHER): Payer: Self-pay | Admitting: Family

## 2024-07-25 VITALS — BP 162/112 | Ht 67.0 in

## 2024-07-25 DIAGNOSIS — G4733 Obstructive sleep apnea (adult) (pediatric): Secondary | ICD-10-CM

## 2024-07-25 DIAGNOSIS — I10 Essential (primary) hypertension: Secondary | ICD-10-CM | POA: Diagnosis not present

## 2024-07-25 MED ORDER — HYDRALAZINE HCL 100 MG PO TABS: 100.0000 mg | ORAL_TABLET | Freq: Three times a day (TID) | ORAL | 3 refills | Status: AC

## 2024-07-25 MED ORDER — IBUPROFEN 800 MG PO TABS: 800.0000 mg | ORAL_TABLET | Freq: Three times a day (TID) | ORAL | Status: AC | PRN

## 2024-07-25 NOTE — Patient Instructions (Signed)
 Medication Instructions:  INCREASE Hydralazine  to 100mg  three times per day  Testing/Procedures: Your physician has requested that you have a renal artery duplex. During this test, an ultrasound is used to evaluate blood flow to the kidneys. Allow one hour for this exam. Do not eat after midnight the day before and avoid carbonated beverages. Take your medications as you usually do.   Follow-Up: Please follow up November 6th as scheduled for a MyChart Video Visit   Special Instructions:    We will send MyChart message in one week to check in on blood pressure.   Tips to Measure your Blood Pressure Correctly Here's what you can do to ensure a correct reading:  Don't drink a caffeinated beverage or smoke during the 30 minutes before the test.  Sit quietly for five minutes before the test begins.  During the measurement, sit in a chair with your feet on the floor and your arm supported so your elbow is at about heart level.  The inflatable part of the cuff should completely cover at least 80% of your upper arm, and the cuff should be placed on bare skin, not over a shirt.  Don't talk during the measurement.  Have your blood pressure measured twice, with a brief break in between. If the readings are different by 5 points or more, have it done a third time.   Blood pressure categories  Blood pressure category SYSTOLIC (upper number)  DIASTOLIC (lower number)  Normal Less than 120 mm Hg and Less than 80 mm Hg  Elevated 120-129 mm Hg and Less than 80 mm Hg  High blood pressure: Stage 1 hypertension 130-139 mm Hg or 80-89 mm Hg  High blood pressure: Stage 2 hypertension 140 mm Hg or higher or 90 mm Hg or higher  Hypertensive crisis (consult your doctor immediately) Higher than 180 mm Hg and/or Higher than 120 mm Hg  Source: American Heart Association and American Stroke Association. For more on getting your blood pressure under control, buy Controlling Your Blood Pressure, a Special Health  Report from [facility].   Blood Pressure Log   Date   Time  Blood Pressure  Position  Example: Nov 1 9 AM 124/78 sitting

## 2024-07-25 NOTE — Progress Notes (Signed)
 Virtual Visit via Video Note   Because of [name]'s co-morbid illnesses, she is at least at moderate risk for complications without adequate follow up.  This format is felt to be most appropriate for this patient at this time.  All issues noted in this document were discussed and addressed.  A limited physical exam was performed with this format.  Please refer to the patient's chart for her consent to telehealth for [facility].       Date:  07/25/2024   ID:  [name], DOB [birth year], MRN [id number] The patient was identified using 2 identifiers.  Patient Location: Home Provider Location: Office/Clinic   PCP:  Zarwolo, Gloria, [name]   [facility] Providers Cardiologist:  Jayson Sierras, MD / Dr. Raford [facility] APP:  Vannie Reche RAMAN, NP     Evaluation Performed:  Follow-Up Visit  Chief Complaint:  [facility] follow up   History of Present Illness:    [name] is a 38 y.o. female with with a hx of hypertension, sleep apnea on CPAP, PCOS, DM 2 here to follow up in the [facility].    CT ABD/pelvis 2012 negative for renal artery stenosis or fibromuscular dysplasia.  No adenomas at that time.  Echo 09/2021 LVEF 55 to 60%, mild LVH, normal diastolic function.   Established with [facility] 11/25/2021.  Diagnosis of hypertension at 38 years old in setting of PCOS.  Hypertension in both parents.  Previous tobacco use having quit in 2022.  Renin aldosteron, catecholamines and metanephrines, cortisol and TSH unremarkable at initial consult.   She was last seen 05/08/24. BP was poorly controlled. Hydralazine  increased to 25mg  TID. She reported chest pain only associated with anxiety, no symptoms concerning for angina. It was later adjusted to 50mg  TID. She was referred to registered dietician. follow up today via video visit. Has been following a low sodium diet. Has been  increasing her water intake and increasing her physical activity. She has been ofcusing on eating three meals per day as recommended by registered dietician. She also has increased her physical activity. Notes feeling weak this week in setting of high BPs. Recent BP readings:  162/112, 161/109, 133/85, 152/108, 161/117, 164/118, 136/97    Previous antihypertensives: Lisinopril  - hydrochlorothiazide  - swelling in her lips, tingling in jaw  Carvedilol Metoprolol  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    History of kidney stones    Hypertension    IUD (intrauterine device) in place    Migraines    Neuropathy    Obese    Osteoarthritis    left knee   Papanicolaou smear of cervix with positive high risk human papilloma virus (HPV) test 03/22/2021   03/22/21 repeat pap in 1 year per ASCCP guideline, 5 year risk for CIN 3+ is 2.25 %   Polycystic ovarian syndrome  PTSD (post-traumatic stress disorder)    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   Sleep apnea    Trichomonas contact, treated    Type 2 diabetes mellitus (HCC)    Vitamin D  deficiency    Yeast infection    Past Surgical History:  Procedure Laterality Date   CESAREAN SECTION  09/24/2012   Procedure: CESAREAN SECTION;  Surgeon: Elveria Mungo, MD;  Location: [facility];  Service: Gynecology;  Laterality: N/A;   CHOLECYSTECTOMY     CHOLECYSTECTOMY N/A 04/05/2013   Procedure: LAPAROSCOPIC CHOLECYSTECTOMY;  Surgeon: Oneil DELENA Budge, MD;   Location: [facility];  Service: General;  Laterality: N/A;   TONSILLECTOMY       Current Meds  Medication Sig   acetaminophen  (TYLENOL ) 500 MG tablet Take 1,000 mg by mouth as needed.   amLODipine  (NORVASC ) 10 MG tablet Take 1 tablet (10 mg total) by mouth daily.   baclofen (LIORESAL) 10 MG tablet Take 10 mg by mouth 3 (three) times daily as needed.   Blood Glucose Monitoring Suppl DEVI 1 each by Does not apply route in the morning, at noon, and at bedtime. May substitute to any manufacturer covered by patient's insurance.   cloNIDine  HCl (KAPVAY ) 0.1 MG TB12 ER tablet Take 1 tablet (0.1 mg total) by mouth at bedtime.   DULoxetine  (CYMBALTA ) 30 MG capsule Take 1 capsule (30 mg total) by mouth daily. Take with 60mg  once daily.   DULoxetine  (CYMBALTA ) 60 MG capsule Take 1 capsule (60 mg total) by mouth daily. Take with 30mg  once daily.   fluconazole  (DIFLUCAN ) 150 MG tablet TAKE ONE TABLET BY MOUTH NOW THEN CAN REPEAT IN 3 DAYS (Patient taking differently: TAKE ONE TABLET BY MOUTH NOW THEN CAN REPEAT IN 3 DAYS)   gabapentin  (NEURONTIN ) 300 MG capsule Take 1 capsule (300 mg total) by mouth 3 (three) times daily. (Patient taking differently: Take 300 mg by mouth as needed.)   Galcanezumab -gnlm (EMGALITY ) 120 MG/ML SOAJ Inject 120 mg into the skin every 28 (twenty-eight) days.   Glucose Blood (BLOOD GLUCOSE TEST STRIPS) STRP 1 each by In Vitro route in the morning, at noon, and at bedtime. May substitute to any manufacturer covered by patient's insurance.   lamoTRIgine  (LAMICTAL ) 200 MG tablet Take 1 tablet (200 mg total) by mouth daily.   Lancet Device MISC 1 each by Does not apply route in the morning, at noon, and at bedtime. May substitute to any manufacturer covered by patient's insurance.   levonorgestrel  (MIRENA ) 20 MCG/24HR IUD 1 each by Intrauterine route once.   mirtazapine  (REMERON ) 30 MG tablet Take 1 tablet (30 mg total) by mouth at bedtime.   nystatin  cream (MYCOSTATIN ) Apply 1 Application  topically 2 (two) times daily.   promethazine  (PHENERGAN ) 25 MG tablet Take 1 tablet (25 mg total) by mouth every 6 (six) hours as needed for nausea or vomiting.   propranolol  (INDERAL ) 10 MG tablet Take 1 tablet (10 mg total) by mouth 3 (three) times daily as needed (Anxiety/panic).   Rimegepant Sulfate (NURTEC) 75 MG TBDP Take 1 tablet (75 mg total) by mouth daily as needed.   Semaglutide  (RYBELSUS ) 14 MG TABS Take 1 tablet (14 mg total) by mouth daily.   triamcinolone  (KENALOG ) 0.025 % ointment Apply 1 Application topically 2 (two) times daily.   valACYclovir  (VALTREX ) 1000 MG tablet Take 1 tablet (1,000 mg total) by mouth daily.   Vitamin D , Ergocalciferol , (DRISDOL ) 1.25 MG (50000 UNIT) CAPS capsule Take 1 capsule (50,000 Units total) by mouth every 7 (seven) days.   [  DISCONTINUED] Blood Glucose Monitoring Suppl DEVI 1 each by Does not apply route in the morning, at noon, and at bedtime. May substitute to any manufacturer covered by patient's insurance.   [DISCONTINUED] Glucose Blood (BLOOD GLUCOSE TEST STRIPS) STRP 1 each by In Vitro route in the morning, at noon, and at bedtime. May substitute to any manufacturer covered by patient's insurance.   [DISCONTINUED] hydrALAZINE  (APRESOLINE ) 50 MG tablet Take 1 tablet (50 mg total) by mouth 3 (three) times daily.     Allergies:   Patient has no known allergies.   Social History   Tobacco Use   Smoking status: Former    Types: E-cigarettes   Smokeless tobacco: Never   Tobacco comments:    Vape cartridge lasts about a month at a time  Vaping Use   Vaping status: Some Days  Substance Use Topics   Alcohol use: Yes    Comment: Infrequent lately 1 unit of alcohol at the time   Drug use: No     Family Hx: The patient's family history includes Asthma in her brother; COPD in her paternal grandfather; Cancer in her paternal aunt; Cancer - Lung in her maternal grandmother; Congestive Heart Failure in her maternal uncle; Crohn's disease in her  brother; Dementia in her maternal grandfather; Diabetes in her maternal aunt, maternal grandfather, maternal grandmother, maternal uncle, and mother; Heart disease in her maternal grandfather and mother; Heart murmur in her brother; Hypertension in her father, maternal aunt, maternal grandfather, and mother; Neuropathy in her maternal grandfather, maternal uncle, and mother; Stroke in her maternal grandfather.  ROS:   Please see the history of present illness.     All other systems reviewed and are negative.   Prior CV studies:   The following studies were reviewed today:  Cardiac Studies & Procedures   ______________________________________________________________________________________________     ECHOCARDIOGRAM  ECHOCARDIOGRAM COMPLETE 09/09/2021  Narrative ECHOCARDIOGRAM REPORT    Patient Name:   [name] Date of Exam: 09/09/2021 Medical Rec #:  [id number]          Height:       67.0 in Accession #:    [id number]         Weight:       302.8 lb Date of Birth:  12/18/1985         BSA:          2.412 m Patient Age:    38 years           BP:           150/96 mmHg Patient Gender: F                  HR:           80 bpm. Exam Location:  [facility]  Procedure: 2D Echo, Cardiac Doppler and Color Doppler  Indications:    Murmur R01.1  History:        Patient has prior history of Echocardiogram examinations, most recent 04/06/2018. Risk Factors:Hypertension and Diabetes. Covid 19 infection.  Sonographer:    Aida Pizza RCS Referring Phys: [phone] SAMUEL G MCDOWELL  IMPRESSIONS   1. Left ventricular ejection fraction, by estimation, is 55 to 60%. The left ventricle has normal function. The left ventricle has no regional wall motion abnormalities. There is mild left ventricular hypertrophy. Left ventricular diastolic parameters were normal. 2. Right ventricular systolic function is normal. The right ventricular size is normal. Tricuspid regurgitation signal is  inadequate for assessing PA pressure.  3. Left atrial size was upper normal. 4. The mitral valve is grossly normal. Trivial mitral valve regurgitation. 5. The aortic valve is tricuspid. Aortic valve regurgitation is not visualized. 6. The inferior vena cava is normal in size with greater than 50% respiratory variability, suggesting right atrial pressure of 3 mmHg.  Comparison(s): Prior images reviewed side by side. LVEF is normal.  FINDINGS Left Ventricle: Left ventricular ejection fraction, by estimation, is 55 to 60%. The left ventricle has normal function. The left ventricle has no regional wall motion abnormalities. The left ventricular internal cavity size was normal in size. There is mild left ventricular hypertrophy. Left ventricular diastolic parameters were normal.  Right Ventricle: The right ventricular size is normal. No increase in right ventricular wall thickness. Right ventricular systolic function is normal. Tricuspid regurgitation signal is inadequate for assessing PA pressure.  Left Atrium: Left atrial size was upper normal.  Right Atrium: Right atrial size was normal in size.  Pericardium: There is no evidence of pericardial effusion.  Mitral Valve: The mitral valve is grossly normal. Trivial mitral valve regurgitation.  Tricuspid Valve: The tricuspid valve is grossly normal. Tricuspid valve regurgitation is trivial.  Aortic Valve: The aortic valve is tricuspid. There is mild aortic valve annular calcification. Aortic valve regurgitation is not visualized.  Pulmonic Valve: The pulmonic valve was grossly normal. Pulmonic valve regurgitation is trivial.  Aorta: The aortic root is normal in size and structure.  Venous: The inferior vena cava is normal in size with greater than 50% respiratory variability, suggesting right atrial pressure of 3 mmHg.  IAS/Shunts: No atrial level shunt detected by color flow Doppler.   LEFT VENTRICLE PLAX 2D LVIDd:         5.30 cm    Diastology LVIDs:         3.60 cm   LV e' medial:    7.40 cm/s LV PW:         1.30 cm   LV E/e' medial:  12.6 LV IVS:        1.10 cm   LV e' lateral:   10.20 cm/s LVOT diam:     2.10 cm   LV E/e' lateral: 9.2 LV SV:         81 LV SV Index:   33 LVOT Area:     3.46 cm   RIGHT VENTRICLE RV S prime:     10.70 cm/s TAPSE (M-mode): 1.9 cm  LEFT ATRIUM             Index        RIGHT ATRIUM           Index LA diam:        5.00 cm 2.07 cm/m   RA Area:     15.10 cm LA Vol (A2C):   67.3 ml 27.90 ml/m  RA Volume:   36.00 ml  14.93 ml/m LA Vol (A4C):   79.9 ml 33.13 ml/m LA Biplane Vol: 78.1 ml 32.38 ml/m AORTIC VALVE LVOT Vmax:   111.00 cm/s LVOT Vmean:  72.200 cm/s LVOT VTI:    0.233 m  AORTA Ao Root diam: 3.40 cm  MITRAL VALVE MV Area (PHT): 3.65 cm    SHUNTS MV Decel Time: 208 msec    Systemic VTI:  0.23 m MV E velocity: 93.60 cm/s  Systemic Diam: 2.10 cm MV A velocity: 79.00 cm/s MV E/A ratio:  1.18  Jayson Sierras MD Electronically signed by Jayson Sierras MD Signature Date/Time: 09/09/2021/10:25:44 AM  Final          ______________________________________________________________________________________________       Labs/Other Tests and Data Reviewed:    EKG:  No ECG reviewed.  Recent Labs: 05/30/2024: ALT 20; BUN 7; Creatinine, Ser 0.86; Hemoglobin 13.0; Platelets 237; Potassium 3.9; Sodium 141; TSH 1.660   Recent Lipid Panel Lab Results  Component Value Date/Time   CHOL 151 05/30/2024 09:54 AM   TRIG 111 05/30/2024 09:54 AM   HDL 37 (L) 05/30/2024 09:54 AM   CHOLHDL 4.1 05/30/2024 09:54 AM   LDLCALC 94 05/30/2024 09:54 AM    Wt Readings from Last 3 Encounters:  07/10/24 278 lb (126.1 kg)  05/30/24 276 lb 1.9 oz (125.2 kg)  05/14/24 275 lb (124.7 kg)     Risk Assessment/Calculations:          Objective:    Vital Signs:  BP (!) 162/112   Ht 5' 7 (1.702 m)   BMI 43.54 kg/m    VITAL SIGNS:  reviewed RESPIRATORY:  normal  respiratory effort, symmetric expansion CARDIOVASCULAR:  no peripheral edema  ASSESSMENT & PLAN:    Hypertension - BP previously 180s now 130-150s by home readings. Not yet at goal <130/80.  Discussed to monitor BP at home at least 2 hours after medications and sitting for 5-10 minutes.  Increase Hydralazine  to 100mg  TID Continue amlodipne 10mg  daily Defer ARB due to prior lip swelling with Lisinopril  Send MyChart message in one week to assess blood pressure control - if not at goal consider addition of Spironolactone  Renal artery duplex for secondary workup  Diabetes with neuropathy - Diabetes complicated by neuropathy causing discomfort and sleep disturbances. Managed with gabapentin  and compression socks. Continue to follow with PCP.   OSA - CPAP compliance encouraged.   Anxiety - Follows with psychiatry.  Migraine -Follows with neurology      Time:   Today, I have spent 18 minutes with the patient with telehealth technology discussing the above problems.     Medication Adjustments/Labs and Tests Ordered: Current medicines are reviewed at length with the patient today.  Concerns regarding medicines are outlined above.   Tests Ordered: Orders Placed This Encounter  Procedures   VAS US  RENAL ARTERY DUPLEX    Medication Changes: Meds ordered this encounter  Medications   ibuprofen  (ADVIL ) 800 MG tablet    Sig: Take 1 tablet (800 mg total) by mouth every 8 (eight) hours as needed.    Supervising Provider:   LONNI SLAIN [[id number]]   hydrALAZINE  (APRESOLINE ) 100 MG tablet    Sig: Take 1 tablet (100 mg total) by mouth 3 (three) times daily.    Dispense:  270 tablet    Refill:  3    Supervising Provider:   LONNI SLAIN [[id number]]    Follow Up:  Virtual Visit  09/12/24  Signed, Reche GORMAN Finder, NP  07/25/2024 11:54 AM    [facility]

## 2024-08-06 DIAGNOSIS — F422 Mixed obsessional thoughts and acts: Secondary | ICD-10-CM | POA: Diagnosis not present

## 2024-08-06 DIAGNOSIS — Z6841 Body Mass Index (BMI) 40.0 and over, adult: Secondary | ICD-10-CM | POA: Diagnosis not present

## 2024-08-06 DIAGNOSIS — Z9151 Personal history of suicidal behavior: Secondary | ICD-10-CM | POA: Diagnosis not present

## 2024-08-06 DIAGNOSIS — F4001 Agoraphobia with panic disorder: Secondary | ICD-10-CM | POA: Diagnosis not present

## 2024-08-06 DIAGNOSIS — F4312 Post-traumatic stress disorder, chronic: Secondary | ICD-10-CM | POA: Diagnosis not present

## 2024-08-06 DIAGNOSIS — F411 Generalized anxiety disorder: Secondary | ICD-10-CM | POA: Diagnosis not present

## 2024-08-06 DIAGNOSIS — F633 Trichotillomania: Secondary | ICD-10-CM | POA: Diagnosis not present

## 2024-08-06 DIAGNOSIS — Z9152 Personal history of nonsuicidal self-harm: Secondary | ICD-10-CM | POA: Diagnosis not present

## 2024-08-06 DIAGNOSIS — F3181 Bipolar II disorder: Secondary | ICD-10-CM | POA: Diagnosis not present

## 2024-08-06 DIAGNOSIS — R638 Other symptoms and signs concerning food and fluid intake: Secondary | ICD-10-CM | POA: Diagnosis not present

## 2024-08-06 DIAGNOSIS — R41840 Attention and concentration deficit: Secondary | ICD-10-CM | POA: Diagnosis not present

## 2024-08-06 DIAGNOSIS — F5104 Psychophysiologic insomnia: Secondary | ICD-10-CM | POA: Diagnosis not present

## 2024-08-07 ENCOUNTER — Encounter: Attending: Family | Admitting: Nutrition

## 2024-08-15 ENCOUNTER — Encounter (HOSPITAL_BASED_OUTPATIENT_CLINIC_OR_DEPARTMENT_OTHER)

## 2024-08-22 ENCOUNTER — Telehealth: Admitting: Family Medicine

## 2024-09-03 ENCOUNTER — Other Ambulatory Visit: Payer: Self-pay | Admitting: Adult Health

## 2024-09-11 ENCOUNTER — Encounter (HOSPITAL_BASED_OUTPATIENT_CLINIC_OR_DEPARTMENT_OTHER): Payer: Self-pay

## 2024-09-12 ENCOUNTER — Encounter (HOSPITAL_BASED_OUTPATIENT_CLINIC_OR_DEPARTMENT_OTHER): Payer: Self-pay | Admitting: Family

## 2024-09-12 ENCOUNTER — Telehealth (HOSPITAL_BASED_OUTPATIENT_CLINIC_OR_DEPARTMENT_OTHER): Payer: Self-pay

## 2024-09-12 ENCOUNTER — Telehealth (INDEPENDENT_AMBULATORY_CARE_PROVIDER_SITE_OTHER): Admitting: Family

## 2024-09-12 ENCOUNTER — Encounter (HOSPITAL_BASED_OUTPATIENT_CLINIC_OR_DEPARTMENT_OTHER): Payer: Self-pay

## 2024-09-12 VITALS — BP 107/72

## 2024-09-12 DIAGNOSIS — R600 Localized edema: Secondary | ICD-10-CM | POA: Diagnosis not present

## 2024-09-12 DIAGNOSIS — I10 Essential (primary) hypertension: Secondary | ICD-10-CM | POA: Diagnosis not present

## 2024-09-12 DIAGNOSIS — R0609 Other forms of dyspnea: Secondary | ICD-10-CM | POA: Diagnosis not present

## 2024-09-12 MED ORDER — FUROSEMIDE 20 MG PO TABS: 20.0000 mg | ORAL_TABLET | ORAL | 1 refills | Status: AC | PRN

## 2024-09-12 NOTE — Telephone Encounter (Signed)
  Patient Consent for Virtual Visit        [name] has provided verbal consent on 09/12/2024 for a virtual visit (video or telephone).   CONSENT FOR VIRTUAL VISIT FOR:  [name]  By participating in this virtual visit I agree to the following:  I hereby voluntarily request, consent and authorize [facility] and its employed or contracted physicians, physician assistants, nurse practitioners or other licensed health care professionals (the Practitioner), to provide me with telemedicine health care services (the "Services) as deemed necessary by the treating Practitioner. I acknowledge and consent to receive the Services by the Practitioner via telemedicine. I understand that the telemedicine visit will involve communicating with the Practitioner through live audiovisual communication technology and the disclosure of certain medical information by electronic transmission. I acknowledge that I have been given the opportunity to request an in-person assessment or other available alternative prior to the telemedicine visit and am voluntarily participating in the telemedicine visit.  I understand that I have the right to withhold or withdraw my consent to the use of telemedicine in the course of my care at any time, without affecting my right to future care or treatment, and that the Practitioner or I may terminate the telemedicine visit at any time. I understand that I have the right to inspect all information obtained and/or recorded in the course of the telemedicine visit and may receive copies of available information for a reasonable fee.  I understand that some of the potential risks of receiving the Services via telemedicine include:  Delay or interruption in medical evaluation due to technological equipment failure or disruption; Information transmitted may not be sufficient (e.g. poor resolution of images) to allow for appropriate medical decision making by the  Practitioner; and/or  In rare instances, security protocols could fail, causing a breach of personal health information.  Furthermore, I acknowledge that it is my responsibility to provide information about my medical history, conditions and care that is complete and accurate to the best of my ability. I acknowledge that Practitioner's advice, recommendations, and/or decision may be based on factors not within their control, such as incomplete or inaccurate data provided by me or distortions of diagnostic images or specimens that may result from electronic transmissions. I understand that the practice of medicine is not an exact science and that Practitioner makes no warranties or guarantees regarding treatment outcomes. I acknowledge that a copy of this consent can be made available to me via my patient portal [facility] MyChart), or I can request a printed copy by calling the office of [facility].    I understand that my insurance will be billed for this visit.   I have read or had this consent read to me. I understand the contents of this consent, which adequately explains the benefits and risks of the Services being provided via telemedicine.  I have been provided ample opportunity to ask questions regarding this consent and the Services and have had my questions answered to my satisfaction. I give my informed consent for the services to be provided through the use of telemedicine in my medical care

## 2024-09-12 NOTE — Patient Instructions (Addendum)
 Medication Instructions:   START Furosemide  20mg  as needed once per day for swelling If needing more than once per week, please call let us  know   Testing/Procedures: Your physician has requested that you have an echocardiogram. Echocardiography is a painless test that uses sound waves to create images of your heart. It provides your doctor with information about the size and shape of your heart and how well your heart's chambers and valves are working. This procedure takes approximately one hour. There are no restrictions for this procedure. Please do NOT wear cologne, perfume, aftershave, or lotions (deodorant is allowed). Please arrive 15 minutes prior to your appointment time.  Please note: We ask at that you not bring children with you during ultrasound (echo/ vascular) testing. Due to room size and safety concerns, children are not allowed in the ultrasound rooms during exams. Our front office staff cannot provide observation of children in our lobby area while testing is being conducted. An adult accompanying a patient to their appointment will only be allowed in the ultrasound room at the discretion of the ultrasound technician under special circumstances. We apologize for any inconvenience.   Your physician has requested that you have a renal artery duplex. During this test, an ultrasound is used to evaluate blood flow to the kidneys. Allow one hour for this exam. Do not eat after midnight the day before and avoid carbonated beverages. Take your medications as you usually do.   Follow-Up: Please follow up in 3-4 months in [facility] with Dr. Raford, Reche Finder, NP or Allean Mink PharmD    Special Instructions:   To prevent or reduce lower extremity swelling: Eat a low salt diet. Salt makes the body hold onto extra fluid which causes swelling. Sit with legs elevated. For example, in the recliner or on an ottoman.  Wear knee-high compression stockings during the daytime.  Ones labeled 15-20 mmHg provide good compression.

## 2024-09-12 NOTE — Progress Notes (Signed)
 Virtual Visit via Video Note   Because of [name]'s co-morbid illnesses, she is at least at moderate risk for complications without adequate follow up.  This format is felt to be most appropriate for this patient at this time.  All issues noted in this document were discussed and addressed.  A limited physical exam was performed with this format.  Please refer to the patient's chart for her consent to telehealth for [facility].  Date:  09/12/2024   ID:  CAROLAN [name], DOB Nov 18, 1985, MRN [id number] The patient was identified using 2 identifiers.  Patient Location: Home Provider Location: Office/Clinic   PCP:  Edman Meade PEDLAR, [name]   [facility] Providers Cardiologist:  Jayson Sierras, MD / Dr. Raford ADV HTN  Cardiology APP:  Vannie Reche RAMAN, NP     Evaluation Performed:  Follow-Up Visit  Chief Complaint:  [facility] follow up   History of Present Illness:    [name] is a 38 y.o. female with with a hx of hypertension, sleep apnea on CPAP, PCOS, DM 2 here to follow up in the [facility].    CT ABD/pelvis 2012 negative for renal artery stenosis or fibromuscular dysplasia.  No adenomas at that time.  Echo 09/2021 LVEF 55 to 60%, mild LVH, normal diastolic function.   Established with [facility] 11/25/2021.  Diagnosis of hypertension at 38 years old in setting of PCOS.  Hypertension in both parents.  Previous tobacco use having quit in 2022.  Renin aldosteron, catecholamines and metanephrines, cortisol and TSH unremarkable at initial consult. Hydralazine  was adjusted to 50mg  TID. She noted following a low sodium diet.   At visit 07/25/24 her BP had improved from 180s to 130-150s. Hydralazine  increased to 100mg  TID. Renal artery duplex ordered but not performed.   Presents today for follow up. Reports feeling better with improved BP readings. BP prior to eating this morning 107/72.  Reports no lightheadedness, dizziness. Previously only had lightheadedness in the heat. BP ranges 134/96, 127/102. No recent BP readings >140s. Has continued to focus on eating three healthy meals per day as advised by registered dietician. She is using a walking pad while she is working at home. Also walking on the track while her son is at football. She notes some exertional dyspnea for which she has asked PCP to renew her inhalers. She also notes some bilateral LE edema leading to difficulty with her shoes.    Previous antihypertensives: Lisinopril  - hydrochlorothiazide  - swelling in her lips, tingling in jaw  Carvedilol Metoprolol  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    History of kidney stones    Hypertension    IUD (intrauterine device) in place    Migraines    Neuropathy    Obese    Osteoarthritis    left knee   Papanicolaou smear of cervix with positive  high risk human papilloma virus (HPV) test 03/22/2021   03/22/21 repeat pap in 1 year per ASCCP guideline, 5 year risk for CIN 3+ is 2.25 %   Polycystic ovarian syndrome    PTSD (post-traumatic stress disorder)    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   Sleep apnea    Trichomonas contact, treated    Type 2 diabetes mellitus (HCC)    Vitamin D  deficiency    Yeast infection    Past Surgical History:  Procedure Laterality Date   CESAREAN SECTION  09/24/2012   Procedure: CESAREAN SECTION;  Surgeon: Elveria Mungo, MD;   Location: [facility];  Service: Gynecology;  Laterality: N/A;   CHOLECYSTECTOMY     CHOLECYSTECTOMY N/A 04/05/2013   Procedure: LAPAROSCOPIC CHOLECYSTECTOMY;  Surgeon: Oneil DELENA Budge, MD;  Location: [facility];  Service: General;  Laterality: N/A;   TONSILLECTOMY       Current Meds  Medication Sig   furosemide  (LASIX ) 20 MG tablet Take 1 tablet (20 mg total) by mouth as needed for edema or fluid (Once per day as needed for swelling. If needing more than once per week, please contact cardiology.).     Allergies:   Patient has no known allergies.   Social History   Tobacco Use   Smoking status: Former    Types: E-cigarettes   Smokeless tobacco: Never   Tobacco comments:    Vape cartridge lasts about a month at a time  Vaping Use   Vaping status: Some Days  Substance Use Topics   Alcohol use: Yes    Comment: Infrequent lately 1 unit of alcohol at the time   Drug use: No     Family Hx: The patient's family history includes Asthma in her brother; COPD in her paternal grandfather; Cancer in her paternal aunt; Cancer - Lung in her maternal grandmother; Congestive Heart Failure in her maternal uncle; Crohn's disease in her brother; Dementia in her maternal grandfather; Diabetes in her maternal aunt, maternal grandfather, maternal grandmother, maternal uncle, and mother; Heart disease in her maternal grandfather and mother; Heart murmur in her brother; Hypertension in her father, maternal aunt, maternal grandfather, and mother; Neuropathy in her maternal grandfather, maternal uncle, and mother; Stroke in her maternal grandfather.  ROS:   Please see the history of present illness.     All other systems reviewed and are negative.   Prior CV studies:   The following studies were reviewed today:  Cardiac Studies & Procedures   ______________________________________________________________________________________________     ECHOCARDIOGRAM  ECHOCARDIOGRAM COMPLETE  09/09/2021  Narrative ECHOCARDIOGRAM REPORT    Patient Name:   [name] Date of Exam: 09/09/2021 Medical Rec #:  [id number]          Height:       67.0 in Accession #:    [id number]         Weight:       302.8 lb Date of Birth:  Jul 08, 1986         BSA:          2.412 m Patient Age:    38 years           BP:           150/96 mmHg Patient Gender: F                  HR:           80 bpm. Exam Location:  [facility]  Procedure: 2D Echo, Cardiac Doppler and Color Doppler  Indications:    Murmur R01.1  History:        Patient has prior history of Echocardiogram examinations, most recent 04/06/2018. Risk Factors:Hypertension and Diabetes. Covid 19 infection.  Sonographer:    Aida Pizza RCS Referring Phys: [phone] SAMUEL G MCDOWELL  IMPRESSIONS   1. Left ventricular ejection fraction, by estimation, is 55 to 60%. The left ventricle has normal function. The left ventricle has no regional wall motion abnormalities. There is mild left ventricular hypertrophy. Left ventricular diastolic parameters were normal. 2. Right ventricular systolic function is normal. The right ventricular size is normal. Tricuspid regurgitation signal is inadequate for assessing PA pressure. 3. Left atrial size was upper normal. 4. The mitral valve is grossly normal. Trivial mitral valve regurgitation. 5. The aortic valve is tricuspid. Aortic valve regurgitation is not visualized. 6. The inferior vena cava is normal in size with greater than 50% respiratory variability, suggesting right atrial pressure of 3 mmHg.  Comparison(s): Prior images reviewed side by side. LVEF is normal.  FINDINGS Left Ventricle: Left ventricular ejection fraction, by estimation, is 55 to 60%. The left ventricle has normal function. The left ventricle has no regional wall motion abnormalities. The left ventricular internal cavity size was normal in size. There is mild left ventricular hypertrophy. Left ventricular diastolic  parameters were normal.  Right Ventricle: The right ventricular size is normal. No increase in right ventricular wall thickness. Right ventricular systolic function is normal. Tricuspid regurgitation signal is inadequate for assessing PA pressure.  Left Atrium: Left atrial size was upper normal.  Right Atrium: Right atrial size was normal in size.  Pericardium: There is no evidence of pericardial effusion.  Mitral Valve: The mitral valve is grossly normal. Trivial mitral valve regurgitation.  Tricuspid Valve: The tricuspid valve is grossly normal. Tricuspid valve regurgitation is trivial.  Aortic Valve: The aortic valve is tricuspid. There is mild aortic valve annular calcification. Aortic valve regurgitation is not visualized.  Pulmonic Valve: The pulmonic valve was grossly normal. Pulmonic valve regurgitation is trivial.  Aorta: The aortic root is normal in size and structure.  Venous: The inferior vena cava is normal in size with greater than 50% respiratory variability, suggesting right atrial pressure of 3 mmHg.  IAS/Shunts: No atrial level shunt detected by color flow Doppler.   LEFT VENTRICLE PLAX 2D LVIDd:         5.30 cm   Diastology LVIDs:         3.60 cm   LV e' medial:    7.40 cm/s LV PW:         1.30 cm   LV E/e' medial:  12.6 LV IVS:        1.10 cm   LV e' lateral:   10.20 cm/s LVOT diam:     2.10 cm   LV E/e' lateral: 9.2 LV SV:         81 LV SV Index:   33 LVOT Area:     3.46 cm   RIGHT VENTRICLE RV S prime:     10.70 cm/s TAPSE (M-mode): 1.9 cm  LEFT ATRIUM             Index        RIGHT ATRIUM           Index LA diam:        5.00 cm 2.07 cm/m   RA Area:     15.10 cm LA Vol (A2C):   67.3 ml 27.90 ml/m  RA Volume:  36.00 ml  14.93 ml/m LA Vol (A4C):   79.9 ml 33.13 ml/m LA Biplane Vol: 78.1 ml 32.38 ml/m AORTIC VALVE LVOT Vmax:   111.00 cm/s LVOT Vmean:  72.200 cm/s LVOT VTI:    0.233 m  AORTA Ao Root diam: 3.40 cm  MITRAL VALVE MV Area  (PHT): 3.65 cm    SHUNTS MV Decel Time: 208 msec    Systemic VTI:  0.23 m MV E velocity: 93.60 cm/s  Systemic Diam: 2.10 cm MV A velocity: 79.00 cm/s MV E/A ratio:  1.18  Jayson Sierras MD Electronically signed by Jayson Sierras MD Signature Date/Time: 09/09/2021/10:25:44 AM    Final          ______________________________________________________________________________________________         Labs/Other Tests and Data Reviewed:    EKG:  No ECG reviewed.  Recent Labs: 05/30/2024: ALT 20; BUN 7; Creatinine, Ser 0.86; Hemoglobin 13.0; Platelets 237; Potassium 3.9; Sodium 141; TSH 1.660   Recent Lipid Panel Lab Results  Component Value Date/Time   CHOL 151 05/30/2024 09:54 AM   TRIG 111 05/30/2024 09:54 AM   HDL 37 (L) 05/30/2024 09:54 AM   CHOLHDL 4.1 05/30/2024 09:54 AM   LDLCALC 94 05/30/2024 09:54 AM    Wt Readings from Last 3 Encounters:  07/10/24 278 lb (126.1 kg)  05/30/24 276 lb 1.9 oz (125.2 kg)  05/14/24 275 lb (124.7 kg)     Risk Assessment/Calculations:          Objective:    Vital Signs:  BP 107/72    VITAL SIGNS:  reviewed RESPIRATORY:  normal respiratory effort, symmetric expansion CARDIOVASCULAR:  lower extremity edema noted  ASSESSMENT & PLAN:    LE edema / DOE - reports intermittent bilateral pedal edema. Consider etiology venous insufficiency. Rx Lasix  20mg  PRN, she will contact us  if taking more than once per week. She also notes some DOE - consider etiology asthma vs deconditioning. Will update echocardiogram to rule out decreased LVEF due to edema, dyspnea.   Hypertension - BP at home has been well controlled. Continue Hydralazine  100mg  TID, Amlodipine  10mg  daily.  Discussed to monitor BP at home at least 2 hours after medications and sitting for 5-10 minutes.  Defer ARB due to prior lip swelling with Lisinopril  Renal artery duplex previously ordered, not yet performed, will schedule.   Diabetes with neuropathy -  Continue to  follow with PCP.   OSA - CPAP compliance encouraged. Needs new tubing.   Anxiety - Follows with psychiatry.  Migraine -Follows with neurology      Time:   Today, I have spent 13 minutes with the patient with telehealth technology discussing the above problems.     Medication Adjustments/Labs and Tests Ordered: Current medicines are reviewed at length with the patient today.  Concerns regarding medicines are outlined above.   Tests Ordered: Orders Placed This Encounter  Procedures   ECHOCARDIOGRAM COMPLETE    Medication Changes: Meds ordered this encounter  Medications   furosemide  (LASIX ) 20 MG tablet    Sig: Take 1 tablet (20 mg total) by mouth as needed for edema or fluid (Once per day as needed for swelling. If needing more than once per week, please contact cardiology.).    Dispense:  30 tablet    Refill:  1    Supervising Provider:   LONNI SLAIN [[id number]]    Follow Up:  in 3-4 months with [facility]   Signed, Reche GORMAN Finder, NP  09/12/2024 2:52 PM  [facility]

## 2024-10-24 ENCOUNTER — Encounter: Payer: Self-pay | Admitting: Family Medicine

## 2024-10-24 ENCOUNTER — Telehealth: Admitting: Family Medicine

## 2024-10-24 DIAGNOSIS — Z7984 Long term (current) use of oral hypoglycemic drugs: Secondary | ICD-10-CM

## 2024-10-24 DIAGNOSIS — E559 Vitamin D deficiency, unspecified: Secondary | ICD-10-CM

## 2024-10-24 DIAGNOSIS — E7849 Other hyperlipidemia: Secondary | ICD-10-CM

## 2024-10-24 DIAGNOSIS — E1142 Type 2 diabetes mellitus with diabetic polyneuropathy: Secondary | ICD-10-CM

## 2024-10-24 DIAGNOSIS — R7301 Impaired fasting glucose: Secondary | ICD-10-CM

## 2024-10-24 DIAGNOSIS — M1712 Unilateral primary osteoarthritis, left knee: Secondary | ICD-10-CM

## 2024-10-24 DIAGNOSIS — E038 Other specified hypothyroidism: Secondary | ICD-10-CM

## 2024-10-24 DIAGNOSIS — E039 Hypothyroidism, unspecified: Secondary | ICD-10-CM

## 2024-10-24 MED ORDER — NAPROXEN 500 MG PO TABS: 500.0000 mg | ORAL_TABLET | Freq: Two times a day (BID) | ORAL | 0 refills | Status: AC | PRN

## 2024-10-24 MED ORDER — NAPROXEN 500 MG PO TABS: 500.0000 mg | ORAL_TABLET | Freq: Two times a day (BID) | ORAL | 0 refills | Status: DC

## 2024-10-24 NOTE — Progress Notes (Signed)
 Virtual Visit via Video Note  I connected with [name] on 10/24/2024 at  3:20 PM EST by a video enabled telemedicine application and verified that I am speaking with the correct person using two identifiers.  Patient Location: Home Provider Location: Home Office  I discussed the limitations, risks, security, and privacy concerns of performing an evaluation and management service by video and the availability of in person appointments. I also discussed with the patient that there may be a patient responsible charge related to this service. The patient expressed understanding and agreed to proceed.  Subjective: PCP: Angel Angel PEDLAR, [name]  Chief Complaint  Patient presents with   Diabetes    Four month follow up   HPI The patient with a history of osteoarthritis presents today with complaints of increased knee stiffness, popping, and pain. She reports using Biofreeze and Tylenol  for symptom relief; however, pain severity reaches 9/10 during flares. She notes severe pain at night, for which she uses massage and heating pads. She denies any recent injury or trauma.  Additionally, the patient reports experiencing episodes of dizziness, hot flashes, and vomiting, during which her blood glucose levels have been noted to be low, ranging from 58 to 68 mg/dL. She admits difficulty eating three meals daily and reports currently taking Rybelsus  14 mg daily.    ROS: Per HPI Current Medications[1]  Observations/Objective: There were no vitals filed for this visit. Physical Exam Patient is well-developed, well-nourished in no acute distress.  Resting comfortably at home.  Head is normocephalic, atraumatic.  No labored breathing.  Speech is clear and coherent with logical content.  Patient is alert and oriented at baseline.   Assessment and Plan: Primary osteoarthritis of left knee -     Ambulatory referral to Orthopedic Surgery -     Naproxen ; Take 1 tablet (500 mg total) by  mouth 2 (two) times daily as needed for moderate pain (pain score 4-6).  Dispense: 60 tablet; Refill: 0  IFG (impaired fasting glucose) -     Hemoglobin A1c  Vitamin D  deficiency -     VITAMIN D  25 Hydroxy (Vit-D Deficiency, Fractures)  TSH (thyroid -stimulating hormone deficiency) -     TSH + free T4  Other hyperlipidemia -     Lipid panel -     CMP14+EGFR -     CBC with Differential/Platelet  Type 2 diabetes mellitus with diabetic polyneuropathy, without long-term current use of insulin (HCC) -     Rybelsus ; Take 1 tablet (7 mg total) by mouth daily.  Dispense: 30 tablet; Refill: 2   The dose of Rybelsus  will be decreased to 7 mg daily.  The patient was encouraged to eat at least three meals daily. Signs and symptoms of hypoglycemia and methods to increase blood glucose levels were reviewed, and the patient verbalized understanding.  A prescription for naproxen  was sent to be taken twice daily as needed for left knee pain.  The patient was encouraged to continue home remedies.  A referral was placed to orthopedic surgery for collaborative care.   Follow Up Instructions: No follow-ups on file.   I discussed the assessment and treatment plan with the patient. The patient was provided an opportunity to ask questions, and all were answered. The patient agreed with the plan and demonstrated an understanding of the instructions.   The patient was advised to call back or seek an in-person evaluation if the symptoms worsen or if the condition fails to improve as anticipated.  The above  assessment and management plan was discussed with the patient. The patient verbalized understanding of and has agreed to the management plan.   Angel Angel [name], [name]     [1]  Current Outpatient Medications:    Semaglutide  (RYBELSUS ) 7 MG TABS, Take 1 tablet (7 mg total) by mouth daily., Disp: 30 tablet, Rfl: 2   acetaminophen  (TYLENOL ) 500 MG tablet, Take 1,000 mg by mouth as needed., Disp: , Rfl:     amLODipine  (NORVASC ) 10 MG tablet, Take 1 tablet (10 mg total) by mouth daily., Disp: 180 tablet, Rfl: 1   baclofen (LIORESAL) 10 MG tablet, Take 10 mg by mouth 3 (three) times daily as needed., Disp: , Rfl:    Blood Glucose Monitoring Suppl DEVI, 1 each by Does not apply route in the morning, at noon, and at bedtime. May substitute to any manufacturer covered by patient's insurance., Disp: 1 each, Rfl: 0   cloNIDine  HCl (KAPVAY ) 0.1 MG TB12 ER tablet, Take 1 tablet (0.1 mg total) by mouth at bedtime., Disp: 30 tablet, Rfl: 5   Continuous Glucose Sensor (FREESTYLE LIBRE 3 SENSOR) MISC, Place 1 sensor on the skin every 14 days. Use to check glucose continuously (Patient not taking: Reported on 07/25/2024), Disp: 2 each, Rfl: 2   DULoxetine  (CYMBALTA ) 30 MG capsule, Take 1 capsule (30 mg total) by mouth daily. Take with 60mg  once daily., Disp: 30 capsule, Rfl: 5   DULoxetine  (CYMBALTA ) 60 MG capsule, Take 1 capsule (60 mg total) by mouth daily. Take with 30mg  once daily., Disp: 30 capsule, Rfl: 5   fluconazole  (DIFLUCAN ) 150 MG tablet, TAKE ONE TABLET BY MOUTH NOW THEN CAN REPEAT IN 3 DAYS, Disp: 2 tablet, Rfl: 11   furosemide  (LASIX ) 20 MG tablet, Take 1 tablet (20 mg total) by mouth as needed for edema or fluid (Once per day as needed for swelling. If needing more than once per week, please contact cardiology.)., Disp: 30 tablet, Rfl: 1   gabapentin  (NEURONTIN ) 300 MG capsule, Take 1 capsule (300 mg total) by mouth 3 (three) times daily. (Patient taking differently: Take 300 mg by mouth as needed.), Disp: 90 capsule, Rfl: 0   Galcanezumab -gnlm (EMGALITY ) 120 MG/ML SOAJ, Inject 120 mg into the skin every 28 (twenty-eight) days., Disp: 1.12 mL, Rfl: 11   hydrALAZINE  (APRESOLINE ) 100 MG tablet, Take 1 tablet (100 mg total) by mouth 3 (three) times daily., Disp: 270 tablet, Rfl: 3   ibuprofen  (ADVIL ) 800 MG tablet, Take 1 tablet (800 mg total) by mouth every 8 (eight) hours as needed., Disp: , Rfl:     lamoTRIgine  (LAMICTAL ) 200 MG tablet, Take 1 tablet (200 mg total) by mouth daily., Disp: 30 tablet, Rfl: 5   levonorgestrel  (MIRENA ) 20 MCG/24HR IUD, 1 each by Intrauterine route once., Disp: , Rfl:    mirtazapine  (REMERON ) 30 MG tablet, Take 1 tablet (30 mg total) by mouth at bedtime., Disp: 30 tablet, Rfl: 5   naproxen  (NAPROSYN ) 500 MG tablet, Take 1 tablet (500 mg total) by mouth 2 (two) times daily as needed for moderate pain (pain score 4-6)., Disp: 60 tablet, Rfl: 0   nystatin  cream (MYCOSTATIN ), Apply 1 Application topically 2 (two) times daily., Disp: 30 g, Rfl: 11   promethazine  (PHENERGAN ) 25 MG tablet, Take 1 tablet (25 mg total) by mouth every 6 (six) hours as needed for nausea or vomiting., Disp: 30 tablet, Rfl: 5   propranolol  (INDERAL ) 10 MG tablet, Take 1 tablet (10 mg total) by mouth 3 (three) times  daily as needed (Anxiety/panic)., Disp: 90 tablet, Rfl: 5   Rimegepant Sulfate (NURTEC) 75 MG TBDP, Take 1 tablet (75 mg total) by mouth daily as needed., Disp: 8 tablet, Rfl: 5   triamcinolone  (KENALOG ) 0.025 % ointment, Apply 1 Application topically 2 (two) times daily., Disp: 30 g, Rfl: 11   valACYclovir  (VALTREX ) 1000 MG tablet, Take 1 tablet by mouth once daily, Disp: 30 tablet, Rfl: 3   Vitamin D , Ergocalciferol , (DRISDOL ) 1.25 MG (50000 UNIT) CAPS capsule, Take 1 capsule (50,000 Units total) by mouth every 7 (seven) days., Disp: 27 capsule, Rfl: 1

## 2024-10-25 ENCOUNTER — Other Ambulatory Visit (HOSPITAL_COMMUNITY): Payer: Self-pay

## 2024-10-25 ENCOUNTER — Ambulatory Visit: Admitting: Family Medicine

## 2024-10-25 ENCOUNTER — Telehealth: Payer: Self-pay | Admitting: Pharmacy Technician

## 2024-10-25 NOTE — Telephone Encounter (Signed)
 Pharmacy Patient Advocate Encounter   Received notification from Patient Advice Request messages that prior authorization for Freestyle Libre 3 Plus sensors is required/requested.   Insurance verification completed.   The patient is insured through [employer].   Patient must be treated with daily insulin injections in order to qualify for coverage for a continuous glucose monitor. Her current drug therapy shows she in not treated with insulin. Her insurance will cover an Accu-chek Guide meter, strips and lancets.

## 2024-10-25 NOTE — Telephone Encounter (Signed)
 PA request has been Received. New Encounter has been or will be created for follow up. For additional info see Pharmacy Prior Auth telephone encounter from 10/25/2024.

## 2024-10-25 NOTE — Telephone Encounter (Signed)
 CMM KEY# [id number]

## 2024-10-28 ENCOUNTER — Encounter (HOSPITAL_BASED_OUTPATIENT_CLINIC_OR_DEPARTMENT_OTHER): Payer: Self-pay | Admitting: Family

## 2024-11-15 ENCOUNTER — Telehealth: Payer: Self-pay

## 2024-11-15 NOTE — Telephone Encounter (Signed)
 Copied from CRM #[id number]. Topic: Referral - Status >> Jul 30, 2024 11:07 AM [name] wrote: Reason for CRM: Patient calling - the referral that was sent to [facility], they do not prescribe medications. She is wanting one that will.

## 2024-11-29 ENCOUNTER — Other Ambulatory Visit: Payer: Self-pay

## 2024-11-29 ENCOUNTER — Ambulatory Visit: Admitting: Orthopedic Surgery

## 2024-11-29 VITALS — Ht 67.0 in | Wt 294.0 lb

## 2024-11-29 DIAGNOSIS — M25562 Pain in left knee: Secondary | ICD-10-CM

## 2024-11-29 DIAGNOSIS — M1712 Unilateral primary osteoarthritis, left knee: Secondary | ICD-10-CM

## 2024-11-29 DIAGNOSIS — M7122 Synovial cyst of popliteal space [Baker], left knee: Secondary | ICD-10-CM | POA: Diagnosis not present

## 2024-11-29 DIAGNOSIS — G8929 Other chronic pain: Secondary | ICD-10-CM

## 2024-11-30 ENCOUNTER — Encounter: Payer: Self-pay | Admitting: Orthopedic Surgery

## 2024-11-30 NOTE — Progress Notes (Signed)
 New Patient Visit  Summary: [name] is a 39 y.o. female with the following: 1. Left knee pain, unspecified chronicity  Assessment and Plan Assessment & Plan Left knee osteoarthritis Early, mild osteoarthritis confirmed by radiography with mild joint space narrowing and small osteophytes. Symptoms manageable, no surgical intervention needed. Limited role for Intra-articular injectionsdue to age and risk of cartilage degeneration. Differential includes other intra-articular pathology if symptoms persist. - Reviewed radiographic findings and discussed mild osteoarthritic changes. - Recommended continuation of naproxen  as needed, with option for daily use for 7-10 days if tolerated. - Advised continuation of home exercise program and activity modification. - Recommended initiation of formal physical therapy to improve strength and stability. - Provided knee brace for support. - Approved use of compression stockings if tolerated. - Scheduled follow-up in two months to reassess symptoms and response to therapy. - Discussed possible MRI if symptoms persist despite therapy.  Baker cyst of the left knee - Discussed diagnosis and prevalence of Baker cyst in knee pathology. - Advised monitoring symptoms; if pain or swelling persists, ultrasound-guided aspiration and steroid injection may be considered.     Follow-up: Return in about 2 months (around 01/27/2025).  Subjective:  Chief Complaint  Patient presents with   Knee Pain    L had a injury in HS and then fell in shower in '24, told she has arthritis in the knee. Does take naprosyn  as needed and also uses heat and ice with minor relief.      Discussed the use of AI scribe software for clinical note transcription with the patient, who gave verbal consent to proceed.  History of Present Illness [name] is a 39 year old female with prior left knee injuries who presents with left knee pain.  She has persistent left  knee pain, mainly posterior, with stiffness after prolonged sitting or cold exposure. She notes popping with stretching or standing and sharp mid-knee pain after sitting. Pain is 5/10 and improves with naproxen .  She feels instability with daily activities and needs to brace herself when standing, especially after sitting or while showering. She is concerned about safety at home after a prior fall in the shower and now prefers to have someone present when showering.  She has two prior left knee injuries: one at a high school track meet that required knee injections, and a fall in the shower about a year ago causing a sprain. She has been told she has a sprain and osteoarthritis, without known ligament tear or other structural injury.  She uses naproxen  as needed with relief. She has tried acetaminophen , ibuprofen , hot and cold compresses, and home exercises prescribed by her primary care clinician. She walks on a desk walking pad about 20 minutes daily, alternates sitting and walking, uses a cane as needed, and wears knee-high compression socks without worsening pain.    Review of Systems: No fevers or chills No numbness or tingling No chest pain No shortness of breath No bowel or bladder dysfunction No GI distress No headaches   Medical History:  Past Medical History:  Diagnosis Date   Anxiety    Arrhythmia    ASCUS of cervix with negative high risk HPV 03/17/2022   03/17/22 repeat pap in 1 year per ASCCP guidelines 5 year  CIN 3+ risk is 2.6%   Asthma 07-27-20   Post covid   Bronchitis    Depression    Encounter for general adult medical examination with abnormal findings 02/06/2023   Encounter for  gynecological examination with Papanicolaou smear of cervix 03/12/2021   Gallstone    Headache(784.0)    Heart murmur 02-23-20   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    History of kidney stones    Hypertension    IUD (intrauterine device) in place    Migraines     Neuropathy    Obese    Osteoarthritis    left knee   Papanicolaou smear of cervix with positive high risk human papilloma virus (HPV) test 03/22/2021   03/22/21 repeat pap in 1 year per ASCCP guideline, 5 year risk for CIN 3+ is 2.25 %   Polycystic ovarian syndrome    PTSD (post-traumatic stress disorder)    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   Sleep apnea    Trichomonas contact, treated    Type 2 diabetes mellitus (HCC)    Vitamin D  deficiency    Yeast infection     Past Surgical History:  Procedure Laterality Date   CESAREAN SECTION  09/24/2012   Procedure: CESAREAN SECTION;  Surgeon: Elveria Mungo, MD;  Location: [facility];  Service: Gynecology;  Laterality: N/A;   CHOLECYSTECTOMY     CHOLECYSTECTOMY N/A 04/05/2013   Procedure: LAPAROSCOPIC CHOLECYSTECTOMY;  Surgeon: Oneil DELENA Budge, MD;  Location: [facility];  Service: General;  Laterality: N/A;   TONSILLECTOMY      Family History  Problem Relation Age of Onset   Neuropathy Mother    Hypertension Mother    Diabetes Mother    Heart disease Mother    Hypertension Father    Asthma Brother    Heart murmur Brother    Crohn's disease Brother    Diabetes Maternal Aunt    Hypertension Maternal Aunt    Neuropathy Maternal Uncle    Diabetes Maternal Uncle    Congestive Heart Failure Maternal Uncle    Cancer Paternal Aunt        uterine   Diabetes Maternal Grandmother    Cancer - Lung Maternal Grandmother    Neuropathy Maternal Grandfather    Dementia Maternal Grandfather    Diabetes Maternal Grandfather    Hypertension Maternal Grandfather    Stroke Maternal Grandfather    Heart disease Maternal Grandfather    COPD Paternal Grandfather    Social History[1]  Allergies[2]  Active Medications[3]  Objective: Ht 5' 7 (1.702 m)   Wt 294 lb (133.4 kg)   BMI 46.05 kg/m   Physical Exam:    General: Alert and oriented. and No acute distress. Gait: Normal gait.  Physical Exam MUSCULOSKELETAL: Tenderness at  patellar tendon. Tenderness in posterior knee. Pain in posterior knee on flexion. Knee stable on examination.  No increased laxity to varus and valgus stress.  Negative Lachman.  Good ROM.    IMAGING: I personally ordered and reviewed the following images  XR of the left knee were obtained in clinic today.  No acute injury.  Neutral overall alignment.  Mild loss of joint space in the medial compartment.  Small peripheral osteophytes.  Well maintained joint space within the patellofemoral joint, with very small osteophytes.  No bony lesions.   Impression:  Left knee XR with early degenerative changes.   New Medications:  No orders of the defined types were placed in this encounter.     Portions of this note were completed via Scientist, clinical (histocompatibility and immunogenetics).  Oneil DELENA Horde, MD  11/30/2024 10:16 AM      [1]  Social History Tobacco Use   Smoking status: Former  Types: E-cigarettes   Smokeless tobacco: Never   Tobacco comments:    Vape cartridge lasts about a month at a time  Vaping Use   Vaping status: Some Days  Substance Use Topics   Alcohol use: Yes    Comment: Infrequent lately 1 unit of alcohol at the time   Drug use: No  [2] No Known Allergies [3]  Current Meds  Medication Sig   acetaminophen  (TYLENOL ) 500 MG tablet Take 1,000 mg by mouth as needed.   amLODipine  (NORVASC ) 10 MG tablet Take 1 tablet (10 mg total) by mouth daily.   baclofen (LIORESAL) 10 MG tablet Take 10 mg by mouth 3 (three) times daily as needed.   Blood Glucose Monitoring Suppl DEVI 1 each by Does not apply route in the morning, at noon, and at bedtime. May substitute to any manufacturer covered by patient's insurance.   cloNIDine  HCl (KAPVAY ) 0.1 MG TB12 ER tablet Take 1 tablet (0.1 mg total) by mouth at bedtime.   DULoxetine  (CYMBALTA ) 30 MG capsule Take 1 capsule (30 mg total) by mouth daily. Take with 60mg  once daily.   DULoxetine  (CYMBALTA ) 60 MG capsule Take 1 capsule (60 mg total) by mouth  daily. Take with 30mg  once daily.   furosemide  (LASIX ) 20 MG tablet Take 1 tablet (20 mg total) by mouth as needed for edema or fluid (Once per day as needed for swelling. If needing more than once per week, please contact cardiology.).   gabapentin  (NEURONTIN ) 300 MG capsule Take 1 capsule (300 mg total) by mouth 3 (three) times daily. (Patient taking differently: Take 300 mg by mouth as needed.)   Galcanezumab -gnlm (EMGALITY ) 120 MG/ML SOAJ Inject 120 mg into the skin every 28 (twenty-eight) days.   hydrALAZINE  (APRESOLINE ) 100 MG tablet Take 1 tablet (100 mg total) by mouth 3 (three) times daily.   ibuprofen  (ADVIL ) 800 MG tablet Take 1 tablet (800 mg total) by mouth every 8 (eight) hours as needed.   lamoTRIgine  (LAMICTAL ) 200 MG tablet Take 1 tablet (200 mg total) by mouth daily.   levonorgestrel  (MIRENA ) 20 MCG/24HR IUD 1 each by Intrauterine route once.   mirtazapine  (REMERON ) 30 MG tablet Take 1 tablet (30 mg total) by mouth at bedtime.   naproxen  (NAPROSYN ) 500 MG tablet Take 1 tablet (500 mg total) by mouth 2 (two) times daily as needed for moderate pain (pain score 4-6).   nystatin  cream (MYCOSTATIN ) Apply 1 Application topically 2 (two) times daily.   promethazine  (PHENERGAN ) 25 MG tablet Take 1 tablet (25 mg total) by mouth every 6 (six) hours as needed for nausea or vomiting.   propranolol  (INDERAL ) 10 MG tablet Take 1 tablet (10 mg total) by mouth 3 (three) times daily as needed (Anxiety/panic).   Rimegepant Sulfate (NURTEC) 75 MG TBDP Take 1 tablet (75 mg total) by mouth daily as needed.   Semaglutide  (RYBELSUS ) 7 MG TABS Take 1 tablet (7 mg total) by mouth daily.   triamcinolone  (KENALOG ) 0.025 % ointment Apply 1 Application topically 2 (two) times daily.   valACYclovir  (VALTREX ) 1000 MG tablet Take 1 tablet by mouth once daily   Vitamin D , Ergocalciferol , (DRISDOL ) 1.25 MG (50000 UNIT) CAPS capsule Take 1 capsule (50,000 Units total) by mouth every 7 (seven) days.

## 2025-01-22 ENCOUNTER — Ambulatory Visit: Admitting: Neurology
